# Patient Record
Sex: Male | Born: 1955
Health system: Southern US, Community
[De-identification: ages and names within clinical notes are randomized; demographics above are authoritative.]

## PROBLEM LIST (undated history)

## (undated) DIAGNOSIS — E785 Hyperlipidemia, unspecified: Secondary | ICD-10-CM

## (undated) DIAGNOSIS — I2510 Atherosclerotic heart disease of native coronary artery without angina pectoris: Secondary | ICD-10-CM

## (undated) DIAGNOSIS — I6529 Occlusion and stenosis of unspecified carotid artery: Secondary | ICD-10-CM

## (undated) DIAGNOSIS — I10 Essential (primary) hypertension: Secondary | ICD-10-CM

## (undated) HISTORY — PX: CAROTID ENDARTERECTOMY: SUR193

## (undated) HISTORY — DX: Hyperlipidemia, unspecified: E78.5

## (undated) HISTORY — DX: Occlusion and stenosis of unspecified carotid artery: I65.29

## (undated) HISTORY — PX: CARDIAC CATHETERIZATION: SHX172

---

## 1999-09-11 ENCOUNTER — Emergency Department (HOSPITAL_COMMUNITY): Admission: EM | Admit: 1999-09-11 | Discharge: 1999-09-11 | Payer: Self-pay

## 2000-10-12 ENCOUNTER — Other Ambulatory Visit: Admission: RE | Admit: 2000-10-12 | Discharge: 2000-10-12 | Payer: Self-pay | Admitting: Otolaryngology

## 2002-01-15 ENCOUNTER — Encounter: Payer: Self-pay | Admitting: Family Medicine

## 2002-01-15 ENCOUNTER — Ambulatory Visit (HOSPITAL_COMMUNITY): Admission: RE | Admit: 2002-01-15 | Discharge: 2002-01-15 | Payer: Self-pay | Admitting: Family Medicine

## 2002-01-17 ENCOUNTER — Ambulatory Visit (HOSPITAL_COMMUNITY): Admission: RE | Admit: 2002-01-17 | Discharge: 2002-01-17 | Payer: Self-pay | Admitting: Family Medicine

## 2002-01-17 ENCOUNTER — Encounter: Payer: Self-pay | Admitting: Family Medicine

## 2017-10-16 DIAGNOSIS — Z23 Encounter for immunization: Secondary | ICD-10-CM | POA: Diagnosis not present

## 2017-10-31 DIAGNOSIS — E119 Type 2 diabetes mellitus without complications: Secondary | ICD-10-CM | POA: Diagnosis not present

## 2017-10-31 DIAGNOSIS — J019 Acute sinusitis, unspecified: Secondary | ICD-10-CM | POA: Diagnosis not present

## 2017-10-31 DIAGNOSIS — I10 Essential (primary) hypertension: Secondary | ICD-10-CM | POA: Diagnosis not present

## 2017-10-31 DIAGNOSIS — E782 Mixed hyperlipidemia: Secondary | ICD-10-CM | POA: Diagnosis not present

## 2017-12-20 DIAGNOSIS — D1801 Hemangioma of skin and subcutaneous tissue: Secondary | ICD-10-CM | POA: Diagnosis not present

## 2017-12-20 DIAGNOSIS — D225 Melanocytic nevi of trunk: Secondary | ICD-10-CM | POA: Diagnosis not present

## 2017-12-20 DIAGNOSIS — L821 Other seborrheic keratosis: Secondary | ICD-10-CM | POA: Diagnosis not present

## 2018-03-26 DIAGNOSIS — R300 Dysuria: Secondary | ICD-10-CM | POA: Diagnosis not present

## 2018-03-26 DIAGNOSIS — E782 Mixed hyperlipidemia: Secondary | ICD-10-CM | POA: Diagnosis not present

## 2018-05-28 ENCOUNTER — Other Ambulatory Visit: Payer: Self-pay | Admitting: Family Medicine

## 2018-05-28 DIAGNOSIS — R1032 Left lower quadrant pain: Secondary | ICD-10-CM

## 2018-05-29 ENCOUNTER — Ambulatory Visit
Admission: RE | Admit: 2018-05-29 | Discharge: 2018-05-29 | Disposition: A | Discharge status: Home / Self Care | Payer: 59 | Source: Ambulatory Visit | Attending: Family Medicine | Admitting: Family Medicine

## 2018-05-29 DIAGNOSIS — R1032 Left lower quadrant pain: Secondary | ICD-10-CM

## 2018-05-29 MED ORDER — IOPAMIDOL (ISOVUE-300) INJECTION 61%
100.0000 mL | Freq: Once | INTRAVENOUS | Status: AC | PRN
  Administered 2018-05-29: 100 mL via INTRAVENOUS

## 2018-05-30 ENCOUNTER — Other Ambulatory Visit: Payer: Self-pay | Admitting: Family Medicine

## 2018-05-30 DIAGNOSIS — R1032 Left lower quadrant pain: Secondary | ICD-10-CM

## 2019-12-25 ENCOUNTER — Encounter: Payer: Self-pay | Admitting: Cardiovascular Disease

## 2020-01-02 ENCOUNTER — Encounter: Payer: Self-pay | Admitting: Cardiovascular Disease

## 2020-01-02 ENCOUNTER — Other Ambulatory Visit: Payer: Self-pay

## 2020-01-02 ENCOUNTER — Ambulatory Visit (INDEPENDENT_AMBULATORY_CARE_PROVIDER_SITE_OTHER): Payer: 59 | Admitting: Cardiovascular Disease

## 2020-01-02 ENCOUNTER — Encounter (INDEPENDENT_AMBULATORY_CARE_PROVIDER_SITE_OTHER): Payer: Self-pay

## 2020-01-02 DIAGNOSIS — E7849 Other hyperlipidemia: Secondary | ICD-10-CM | POA: Diagnosis not present

## 2020-01-02 DIAGNOSIS — I10 Essential (primary) hypertension: Secondary | ICD-10-CM | POA: Diagnosis not present

## 2020-01-02 DIAGNOSIS — R9431 Abnormal electrocardiogram [ECG] [EKG]: Secondary | ICD-10-CM | POA: Diagnosis not present

## 2020-01-02 DIAGNOSIS — M5412 Radiculopathy, cervical region: Secondary | ICD-10-CM

## 2020-01-02 DIAGNOSIS — R0789 Other chest pain: Secondary | ICD-10-CM | POA: Diagnosis not present

## 2020-01-02 DIAGNOSIS — R7303 Prediabetes: Secondary | ICD-10-CM

## 2020-01-02 DIAGNOSIS — R55 Syncope and collapse: Secondary | ICD-10-CM

## 2020-01-02 MED ORDER — METOPROLOL SUCCINATE ER 25 MG PO TB24: 25.0000 mg | ORAL_TABLET | Freq: Every day | ORAL | 3 refills | Status: DC

## 2020-01-02 MED ORDER — METOPROLOL TARTRATE 100 MG PO TABS: ORAL_TABLET | ORAL | 0 refills | Status: DC

## 2020-01-02 MED ORDER — NITROGLYCERIN 0.4 MG SL SUBL: 0.4000 mg | SUBLINGUAL_TABLET | SUBLINGUAL | 3 refills | Status: DC | PRN

## 2020-01-02 NOTE — Progress Notes (Signed)
Cardiology Office Note    Date:  01/04/2020   ID:  Jesus Francis, DOB [birth year], MRN [id number]  PCP: Dr. Marijo File Cardiologist:  Shelva Majestic, MD   New cardiology evaluation sent through the courtesy of Dr. Marijo File for evaluation of exertional chest discomfort.  History of Present Illness:  Jesus Francis is a 64 y.o. male who was referred for through the courtesy of Dr. Marijo File for exertional chest pressure.  Mr. Jesus Francis has a history of hypertension as well as hyperlipidemia.  In November 2018, LDL cholesterol was 264 and apparently was against statin therapy.  He states that over 10 years ago he did experience some chest discomfort and apparently had a stress test.  He was told that this was normal but never saw a cardiologist.  The patient has issues with bone spurs involving his cervical and thoracic spine.  He states that around Thanksgiving he became upset and apparently passed out for short duration.  He did note chest pressure.  Subsequently, he has noted some exertional chest pressure with activity.  Remotely he used to work out hard but admits that over the past 2 years he really has not exercised to any capacity.  He does admit to being under increased stress and that his wife died 31 years ago at age 76.  His youngest daughter also had brain cancer.  He was recently evaluated by Dr. Marijo File on December 25, 2019.  His ECG raise concern for prior heart attack.  There was no ECG to compare to laboratory showed a total cholesterol of 318, HDL 45, LDL 240, triglycerides 170.  Hemoglobin A1c was 6.1.  Creatinine 1.02.  LFTs were normal.  During his evaluation, his blood pressure was elevated at 172/102 and he was started on a prescription this oculovestibular exam.  He also was started on rosuvastatin 40 mg in light of his significant hyperlipidemia.  He is now referred for cardiology evaluation.   Past medical history is notable for hypertension, hyperlipidemia,  history of kidney stones, history of internal hemorrhoids, remote history of hematuria with negative work-up by urology, history of carpal tunnel syndrome status post bilateral release, previous documentation of aortoiliac atherosclerosis seen on CT imaging in 2019.  Past surgical history is notable for his carpal tunnel release, hemorrhoidal surgery, as well as vocal cord surgery.  Current Medications: Outpatient Medications Prior to Visit  Medication Sig Dispense Refill  . aspirin EC 81 MG tablet Take 81 mg by mouth daily.    . rosuvastatin (CRESTOR) 40 MG tablet Take 40 mg by mouth daily.    . valsartan (DIOVAN) 160 MG tablet Take 160 mg by mouth daily.     No facility-administered medications prior to visit.     Allergies:   Patient has no known allergies.   Social History   Socioeconomic History  . Marital status: Married    Spouse name: Not on file  . Number of children: Not on file  . Years of education: Not on file  . Highest education level: Not on file  Occupational History  . Not on file  Tobacco Use  . Smoking status: Never Smoker  . Smokeless tobacco: Never Used  Substance and Sexual Activity  . Alcohol use: Yes    Alcohol/week: 3.0 standard drinks    Types: 1 Glasses of wine, 1 Shots of liquor, 1 Cans of beer per week  . Drug use: Not on file  . Sexual activity: Not on file  Other  Topics Concern  . Not on file  Social History Narrative  . Not on file   Social Determinants of Health   Financial Resource Strain:   . Difficulty of Paying Living Expenses: Not on file  Food Insecurity:   . Worried About Charity fundraiser in the Last Year: Not on file  . Ran Out of Food in the Last Year: Not on file  Transportation Needs:   . Lack of Transportation (Medical): Not on file  . Lack of Transportation (Non-Medical): Not on file  Physical Activity:   . Days of Exercise per Week: Not on file  . Minutes of Exercise per Session: Not on file  Stress:   . Feeling  of Stress : Not on file  Social Connections:   . Frequency of Communication with Friends and Family: Not on file  . Frequency of Social Gatherings with Friends and Family: Not on file  . Attends Religious Services: Not on file  . Active Member of Clubs or Organizations: Not on file  . Attends Archivist Meetings: Not on file  . Marital Status: Not on file     Social history is notable in that he is widowed for 5 years when his wife died at age 34.  He has 2 children ages 87 and 83 and his youngest daughter had a previous brain tumor.  He graduated from [employer].  He works for [employer] in [employer].  He does not smoke cigarettes.  There is a rare cigar use.  He drinks an occasional beer wine scotch or bourbon.  Family History:  The patient's family history includes Heart attack in his brother, brother, and father; Heart disease in his brother, sister, and sister.   His mother died at age 43.  His father died at age 64 but had a heart attack at age 66.  There are 6 siblings.  Brother age 34 who is alive and well, brother age 31 has a pacemaker and defibrillator, a brother died at age 106 with a heart attack and another brother died at age 45 with a heart attack.  One sister age 16 had a stent at age 65 and another sister at age 22 had bypass surgery at age 30.  ROS General: Negative; No fevers, chills, or night sweats;  HEENT: Negative; No changes in vision or hearing, sinus congestion, difficulty swallowing Pulmonary: Negative; No cough, wheezing, shortness of breath, hemoptysis Cardiovascular: See HPI GI: Negative; No nausea, vomiting, diarrhea, or abdominal pain GU: Negative; No dysuria, hematuria, or difficulty voiding Musculoskeletal: Positive for left radicular pain Hematologic/Oncology: Negative; no easy bruising, bleeding Endocrine: Negative; no heat/cold intolerance; no diabetes Neuro: Negative; no changes in balance, headaches Skin: Negative; No rashes or skin  lesions Psychiatric: Negative; No behavioral problems, depression Sleep: Negative; No snoring, daytime sleepiness, hypersomnolence, bruxism, restless legs, hypnogognic hallucinations, no cataplexy Other comprehensive 14 point system review is negative.   PHYSICAL EXAM:   VS:  BP (!) 142/83   Pulse 81   Temp (!) 97.1 F (36.2 C)   Ht 5\' 9"  (1.753 m)   Wt 199 lb 12.8 oz (90.6 kg)   SpO2 97%   BMI 29.51 kg/m     Repeat blood pressure by me was 136/84  Wt Readings from Last 3 Encounters:  01/02/20 199 lb 12.8 oz (90.6 kg)    General: Alert, oriented, no distress.  Skin: normal turgor, no rashes, warm and dry HEENT: Normocephalic, atraumatic. Pupils equal round and reactive  to light; sclera anicteric; extraocular muscles intact;  Nose without nasal septal hypertrophy Mouth/Parynx benign;  Neck: No JVD, no carotid bruits; normal carotid upstroke Lungs: clear to ausculatation and percussion; no wheezing or rales Chest wall: without tenderness to palpitation Heart: PMI not displaced, RRR, s1 s2 normal, 1/6 systolic murmur, no diastolic murmur, no rubs, gallops, thrills, or heaves Abdomen: soft, nontender; no hepatosplenomehaly, BS+; abdominal aorta nontender and not dilated by palpation. Back: no CVA tenderness Pulses 2+ Musculoskeletal: full range of motion, normal strength, no joint deformities Extremities: no clubbing cyanosis or edema, Homan's sign negative  Neurologic: grossly nonfocal; Cranial nerves grossly wnl Psychologic: Normal mood and affect   Studies/Labs Reviewed:   EKG:  EKG is ordered today.  ECG (independently read by me): Normal sinus rhythm at 81 bpm.  Small inferior Q waves in leads III and aVF suggestive of  age-indeterminate inferior infarct.  Early transition.  Normal intervals.  No ectopy  Recent Labs: No flowsheet data found.   No flowsheet data found.  No flowsheet data found. No results found for: MCV No results found for: TSH No results found  for: HGBA1C   BNP No results found for: BNP  ProBNP No results found for: PROBNP   Lipid Panel  No results found for: CHOL, TRIG, HDL, CHOLHDL, VLDL, LDLCALC, LDLDIRECT, LABVLDL   RADIOLOGY: No results found.   Additional studies/ records that were reviewed today include:  I reviewed the records from Dr. Marijo File  Laboratory from December 25, 2019: Total cholesterol 318, HDL 45, LDL 240, triglycerides 170 HDL 6.1, consistent with prediabetes Creatinine 1.02.   ASSESSMENT:    1. Exertional chest tightness   2. Essential hypertension   3. Possible familial hyperlipidemia   4. Abnormal ECG   5. Prediabetes   6. Syncope, unspecified syncope type   7. Cervical radiculopathy     PLAN:  Mr. Orval Gatley is a 64 year old gentleman who has a history of hypertension, significant hyperlipidemia as well as family history for heart disease.  The patient has had markedly elevated LDL levels in the past suggesting a high likelihood for familial hyperlipidemia.  His most recent lipid panel in December 2020 has shown a total cholesterol of 318 with an LDL cholesterol of 240.  The patient recently had experienced an episode where he passed out being and subsequently has noticed some exertional chest pressure.  His ECG today shows sinus rhythm but there is evidence for inferior Q waves with early transition suggesting the possibility of an inferior posterior MI which may have occurred.  He also has recently been found to be significantly hypertensive with stage II hypertension for which he was started on valsartan at 160 mg daily.  I discussed with him new hypertensive guidelines with ideal blood pressure less than 120/80, stage I hypertension commencing at 130/80, and stage II hypertension at 140/90.  He has noticed exertional chest tightness symptomatology worrisome for anginal symptomatology.  He also has had issues with bone spurs involving his cervical and thoracic spine and has been  felt to have a left cervical radiculopathy.  With his exertional symptomatology, I am recommending initiation of metoprolol succinate at 25 mg which will also be helpful for blood pressure control.  I have also given him a prescription for sublingual nitroglycerin.  I am scheduling him for an echo Doppler study to assess LV systolic and diastolic function as well as wall motion particularly with his inferior Q waves.  I discussed further evaluation strategies regarding coronary  anatomy.  I will schedule him for an initial coronary CTA evaluation to evaluate coronary atherosclerosis and FFR will be performed if indicated.  I am concerned that he may very well have familial hyperlipidemia.  He was just started on rosuvastatin.  He will need to undergo repeat laboratory in several months and if still elevated Zetia will need to be added.  If LDL continues to be elevated I discussed PCSK9 inhibition and he may be a candidate for Repatha.  He will monitor his blood pressure at home.  His elevated hemoglobin A1c at 6.1 is consistent with prediabetes.  I will see him in the office setting in 4 weeks in follow-up of the above studies and further recommendations will be made at that time.   Medication Adjustments/Labs and Tests Ordered: Current medicines are reviewed at length with the patient today.  Concerns regarding medicines are outlined above.  Medication changes, Labs and Tests ordered today are listed in the Patient Instructions below. Patient Instructions  Medication Instructions:  START METOPROLOL SUCC ER 25 MG ONCE DAILY AT BEDTIME  NTG AS NEEDED  *If you need a refill on your cardiac medications before your next appointment, please call your pharmacy*  Lab Work: Your physician recommends that you return for lab work in: [place]  If you have labs (blood work) drawn today and your tests are completely normal, you will receive your results only by: Marland Kitchen MyChart Message (if you have  MyChart) OR . A paper copy in the mail If you have any lab test that is abnormal or we need to change your treatment, we will call you to review the results.  Testing/Procedures: Your physician has requested that you have an echocardiogram. Echocardiography is a painless test that uses sound waves to create images of your heart. It provides your doctor with information about the size and shape of your heart and how well your heart's chambers and valves are working. This procedure takes approximately one hour. There are no restrictions for this procedure.[place]    Follow-Up: At [facility], you and your health needs are our priority.  As part of our continuing mission to provide you with exceptional heart care, we have created designated Provider Care Teams.  These Care Teams include your primary Cardiologist (physician) and Advanced Practice Providers (APPs -  Physician Assistants and Nurse Practitioners) who all work together to provide you with the care you need, when you need it.  Your next appointment:   4 week(s)  The format for your next appointment:   In Person  Provider:   Shelva Majestic, MD  Other Instructions Your cardiac CT will be scheduled at one of the below locations:   [facility] [address] [place], [place] [address] [phone]  If scheduled at [facility], please arrive at the [facility] main entrance of [facility] 30-45 minutes prior to test start time. Proceed to the [facility] Radiology Department (first floor) to check-in and test prep.  Please follow these instructions carefully (unless otherwise directed):  Hold all erectile dysfunction medications at least 3 days (72 hrs) prior to test.  On the Night Before the Test: . Be sure to Drink plenty of water. . Do not consume any caffeinated/decaffeinated beverages or chocolate 12 hours prior to your test. . Do not take any antihistamines 12 hours prior  to your test.  On the Day of the Test: . Drink plenty of water. Do  not drink any water within one hour of the test. . Do not eat any food 4 hours prior to the test. . You may take your regular medications prior to the test.  . Take metoprolol (Lopressor) 100 MG two hours prior to test. . HOLD Furosemide/Hydrochlorothiazide morning of the test. . FEMALES- please wear underwire-free bra if available       After the Test: . Drink plenty of water. . After receiving IV contrast, you may experience a mild flushed feeling. This is normal. . On occasion, you may experience a mild rash up to 24 hours after the test. This is not dangerous. If this occurs, you can take Benadryl 25 mg and increase your fluid intake. . If you experience trouble breathing, this can be serious. If it is severe call 911 IMMEDIATELY. If it is mild, please call our office. . If you take any of these medications: Glipizide/Metformin, Avandament, Glucavance, please do not take 48 hours after completing test unless otherwise instructed.   Once we have confirmed authorization from your insurance company, we will call you to set up a date and time for your test.   For non-scheduling related questions, please contact the cardiac imaging nurse navigator should you have any questions/concerns: Marchia Bond, RN Navigator Cardiac Imaging [facility] [phone] Office        Signed, Shelva Majestic, MD  01/04/2020 12:22 PM    [facility] [address], [place], [place], [place]  [address] Phone: [phone]

## 2020-01-02 NOTE — Patient Instructions (Addendum)
Medication Instructions:  START METOPROLOL SUCC ER 25 MG ONCE DAILY AT BEDTIME  NTG AS NEEDED  *If you need a refill on your cardiac medications before your next appointment, please call your pharmacy*  Lab Work: Your physician recommends that you return for lab work in: [place]  If you have labs (blood work) drawn today and your tests are completely normal, you will receive your results only by: Marland Kitchen MyChart Message (if you have MyChart) OR . A paper copy in the mail If you have any lab test that is abnormal or we need to change your treatment, we will call you to review the results.  Testing/Procedures: Your physician has requested that you have an echocardiogram. Echocardiography is a painless test that uses sound waves to create images of your heart. It provides your doctor with information about the size and shape of your heart and how well your heart's chambers and valves are working. This procedure takes approximately one hour. There are no restrictions for this procedure.[place]    Follow-Up: At [facility], you and your health needs are our priority.  As part of our continuing mission to provide you with exceptional heart care, we have created designated Provider Care Teams.  These Care Teams include your primary Cardiologist (physician) and Advanced Practice Providers (APPs -  Physician Assistants and Nurse Practitioners) who all work together to provide you with the care you need, when you need it.  Your next appointment:   4 week(s)  The format for your next appointment:   In Person  Provider:   Shelva Majestic, MD  Other Instructions Your cardiac CT will be scheduled at one of the below locations:   [facility] [address] [place], [place] [address] [phone]  If scheduled at [facility], please arrive at the [facility] main entrance of [facility] 30-45 minutes prior to test start time. Proceed  to the [facility] Radiology Department (first floor) to check-in and test prep.  Please follow these instructions carefully (unless otherwise directed):  Hold all erectile dysfunction medications at least 3 days (72 hrs) prior to test.  On the Night Before the Test: . Be sure to Drink plenty of water. . Do not consume any caffeinated/decaffeinated beverages or chocolate 12 hours prior to your test. . Do not take any antihistamines 12 hours prior to your test.  On the Day of the Test: . Drink plenty of water. Do not drink any water within one hour of the test. . Do not eat any food 4 hours prior to the test. . You may take your regular medications prior to the test.  . Take metoprolol (Lopressor) 100 MG two hours prior to test. . HOLD Furosemide/Hydrochlorothiazide morning of the test. . FEMALES- please wear underwire-free bra if available       After the Test: . Drink plenty of water. . After receiving IV contrast, you may experience a mild flushed feeling. This is normal. . On occasion, you may experience a mild rash up to 24 hours after the test. This is not dangerous. If this occurs, you can take Benadryl 25 mg and increase your fluid intake. . If you experience trouble breathing, this can be serious. If it is severe call 911 IMMEDIATELY. If it is mild, please call our office. . If you take any of these medications: Glipizide/Metformin, Avandament, Glucavance, please do not take 48 hours after completing test unless otherwise instructed.  Once we have confirmed authorization from your insurance company, we will call you to set up a date and time for your test.   For non-scheduling related questions, please contact the cardiac imaging nurse navigator should you have any questions/concerns: Marchia Bond, RN Navigator Cardiac Imaging [facility] [phone] Office

## 2020-01-04 ENCOUNTER — Encounter: Payer: Self-pay | Admitting: Cardiovascular Disease

## 2020-01-08 ENCOUNTER — Ambulatory Visit (HOSPITAL_COMMUNITY): Payer: 59 | Attending: Cardiovascular Disease

## 2020-01-08 ENCOUNTER — Other Ambulatory Visit: Payer: Self-pay

## 2020-01-08 DIAGNOSIS — R0789 Other chest pain: Secondary | ICD-10-CM | POA: Diagnosis present

## 2020-01-16 ENCOUNTER — Telehealth: Payer: Self-pay | Admitting: Cardiovascular Disease

## 2020-01-16 NOTE — Telephone Encounter (Signed)
-----   Message -----  From: Howie Ill  Sent: 01/15/2020 10:49 AM EST  To: Cristopher Estimable, RN  Subject: Cardiac Ct- please call pt            Good morning Debra   I called this patient this morning to schedule the cardiac ct, he is a little upset that my first available appt in [place] is February 4,2021 and [facility] is February 16,2021 , he states that he has an appt with Dr Claiborne Billings on February 4,2021, I offered to add him to the waitlist incase we get a cancellation. He would like you to call him and advise him of what to do.   Thank you  Jannet Askew

## 2020-01-16 NOTE — Telephone Encounter (Addendum)
Patient is upset as his CT was approved per his insurance as of Jan 7. He reports that he called multiple times about the authorization but was told that the authorization was not on file. He is upset that he had to wait on getting his test scheduled. Apologized for this inconvenience and explained that there is limited availability to schedule this tests, based on scanners/reading MDs  He was reminded that his CT test is Feb 4 and he will need labs prior.   R/S patient for f/u with MD on Feb 10 - after testing completed  Question about beta-blocker: He was started on toprol by MD at last visit and also given 1 time dose of lopressor 100mg  to take 2 hours prior to CT test. He is asking if he needs to take both on day of test. Will clarify with MD and call back   Spoke with Dr. Claiborne Billings who reports patient may not need to take metoprolol succinate 25mg  QD AND lopressor 100mg  2 hours prior to CT, depending on his HR  BP/HR readings since starting metoprolol succinate - he does reports some fatigue since starting medication 115/70 HR 70 124/70 HR 65 124/69 HR 68 135/77 HR 72 108/63 HR 83  Per Dr. Claiborne Billings, patient should take toprol 25mg  as directed and only 50mg  of lopressor 2 hours prior to CT test Patient called and updated

## 2020-01-28 LAB — COMPREHENSIVE METABOLIC PANEL
ALT: 22 IU/L (ref 0–44)
AST: 21 IU/L (ref 0–40)
Albumin/Globulin Ratio: 1.5 (ref 1.2–2.2)
Albumin: 4.3 g/dL (ref 3.8–4.8)
Alkaline Phosphatase: 56 IU/L (ref 39–117)
BUN/Creatinine Ratio: 13 (ref 10–24)
BUN: 14 mg/dL (ref 8–27)
Bilirubin Total: 0.3 mg/dL (ref 0.0–1.2)
CO2: 23 mmol/L (ref 20–29)
Calcium: 9.9 mg/dL (ref 8.6–10.2)
Chloride: 103 mmol/L (ref 96–106)
Creatinine, Ser: 1.09 mg/dL (ref 0.76–1.27)
GFR calc Af Amer: 82 mL/min/{1.73_m2} (ref >59)
GFR calc non Af Amer: 71 mL/min/{1.73_m2} (ref >59)
Globulin, Total: 2.8 g/dL (ref 1.5–4.5)
Glucose: 107 mg/dL — ABNORMAL HIGH (ref 65–99)
Potassium: 5.2 mmol/L (ref 3.5–5.2)
Sodium: 141 mmol/L (ref 134–144)
Total Protein: 7.1 g/dL (ref 6.0–8.5)

## 2020-01-28 LAB — LIPID PANEL
Chol/HDL Ratio: 3.5 ratio (ref 0.0–5.0)
Cholesterol, Total: 164 mg/dL (ref 100–199)
HDL: 47 mg/dL (ref >39)
LDL Chol Calc (NIH): 97 mg/dL (ref 0–99)
Triglycerides: 113 mg/dL (ref 0–149)
VLDL Cholesterol Cal: 20 mg/dL (ref 5–40)

## 2020-01-29 ENCOUNTER — Encounter (HOSPITAL_COMMUNITY): Payer: Self-pay

## 2020-01-29 ENCOUNTER — Telehealth (HOSPITAL_COMMUNITY): Payer: Self-pay | Admitting: Emergency Medicine

## 2020-01-29 NOTE — Telephone Encounter (Signed)
Reaching out to patient to offer assistance regarding upcoming cardiac imaging study; pt verbalizes understanding of appt date/time, parking situation and where to check in, pre-test NPO status and medications ordered, and verified current allergies; name and call back number provided for further questions should they arise Edith Lord RN Navigator Cardiac Imaging [facility] [phone] office [phone] cell 

## 2020-01-30 ENCOUNTER — Ambulatory Visit: Payer: 59 | Admitting: Cardiovascular Disease

## 2020-01-30 ENCOUNTER — Other Ambulatory Visit: Payer: Self-pay

## 2020-01-30 ENCOUNTER — Ambulatory Visit
Admission: RE | Admit: 2020-01-30 | Discharge: 2020-01-30 | Disposition: A | Discharge status: Home / Self Care | Payer: 59 | Source: Ambulatory Visit | Attending: Cardiovascular Disease | Admitting: Cardiovascular Disease

## 2020-01-30 DIAGNOSIS — R0789 Other chest pain: Secondary | ICD-10-CM | POA: Insufficient documentation

## 2020-01-30 DIAGNOSIS — I2510 Atherosclerotic heart disease of native coronary artery without angina pectoris: Secondary | ICD-10-CM | POA: Diagnosis not present

## 2020-01-30 MED ORDER — METOPROLOL TARTRATE 5 MG/5ML IV SOLN: 5.0000 mg | INTRAVENOUS | Status: DC | PRN

## 2020-01-30 MED ORDER — IOHEXOL 350 MG/ML SOLN
100.0000 mL | Freq: Once | INTRAVENOUS | Status: AC | PRN
  Administered 2020-01-30: 100 mL via INTRAVENOUS

## 2020-01-30 MED ORDER — NITROGLYCERIN 0.4 MG SL SUBL
0.8000 mg | SUBLINGUAL_TABLET | Freq: Once | SUBLINGUAL | Status: AC
  Administered 2020-01-30: 0.8 mg via SUBLINGUAL

## 2020-01-30 NOTE — Progress Notes (Addendum)
Patient tolerated CT well. Developed a headache 2/10 achy dull. Drank water a coke and ate crackers with peanut butter after. Headache improved to 0/10. Ambulatory steady gait to exit.

## 2020-01-31 ENCOUNTER — Telehealth: Payer: Self-pay | Admitting: Cardiovascular Disease

## 2020-01-31 DIAGNOSIS — I2510 Atherosclerotic heart disease of native coronary artery without angina pectoris: Secondary | ICD-10-CM | POA: Diagnosis not present

## 2020-01-31 NOTE — Telephone Encounter (Signed)
New message:    Patient calling corning some results concering his ECHO. Please call patient back.

## 2020-01-31 NOTE — Telephone Encounter (Signed)
Arrange for office visit next week and at that time we will discuss need for definitive cardiac catheterization

## 2020-01-31 NOTE — Telephone Encounter (Signed)
Patient had a question on his CT results. They were sent to his mychart- and at the bottom it says that a cath is recommended. I did advised patient that Dr.Kelly had no reviewed it yet, to give his recommendations but I would send a message to him and his nurse to make them aware, he just wants to be scheduled for something sooner rather than later.   Thank you!

## 2020-02-05 ENCOUNTER — Encounter: Payer: Self-pay | Admitting: Cardiovascular Disease

## 2020-02-05 ENCOUNTER — Ambulatory Visit (INDEPENDENT_AMBULATORY_CARE_PROVIDER_SITE_OTHER): Payer: 59 | Admitting: Cardiovascular Disease

## 2020-02-05 ENCOUNTER — Other Ambulatory Visit: Payer: Self-pay

## 2020-02-05 VITALS — BP 142/87 | HR 76 | Temp 98.4°F | Ht 69.0 in | Wt 212.6 lb

## 2020-02-05 DIAGNOSIS — R9431 Abnormal electrocardiogram [ECG] [EKG]: Secondary | ICD-10-CM

## 2020-02-05 DIAGNOSIS — I10 Essential (primary) hypertension: Secondary | ICD-10-CM | POA: Diagnosis not present

## 2020-02-05 DIAGNOSIS — E7849 Other hyperlipidemia: Secondary | ICD-10-CM

## 2020-02-05 DIAGNOSIS — R931 Abnormal findings on diagnostic imaging of heart and coronary circulation: Secondary | ICD-10-CM | POA: Diagnosis not present

## 2020-02-05 DIAGNOSIS — I209 Angina pectoris, unspecified: Secondary | ICD-10-CM

## 2020-02-05 MED ORDER — ISOSORBIDE MONONITRATE ER 30 MG PO TB24: 30.0000 mg | ORAL_TABLET | Freq: Every day | ORAL | 2 refills | Status: DC

## 2020-02-05 MED ORDER — METOPROLOL SUCCINATE ER 25 MG PO TB24: 25.0000 mg | ORAL_TABLET | Freq: Two times a day (BID) | ORAL | 3 refills | Status: DC

## 2020-02-05 MED ORDER — EZETIMIBE 10 MG PO TABS: 10.0000 mg | ORAL_TABLET | Freq: Every day | ORAL | 3 refills | Status: DC

## 2020-02-05 NOTE — Patient Instructions (Signed)
Medication Instructions:  BEGIN ZETIA 10MG  DAILY (1 TABLET DAILY) INCREASE METOPROLOL TO 50MG  DAILY ( 1 TABLET TWICE A DAY FOR A TOTAL OF 50MG ) BEGIN TAKING ISOSORBIDE 30MG  DAILY IN THE MORNING (1 TABLET IN THE MORNING) *If you need a refill on your cardiac medications before your next appointment, please call your pharmacy*  Lab Work: CBC, BMET If you have labs (blood work) drawn today and your tests are completely normal, you will receive your results only by: Marland Kitchen MyChart Message (if you have MyChart) OR . A paper copy in the mail If you have any lab test that is abnormal or we need to change your treatment, we will call you to review the results.  Testing/Procedures: CATH SCHEDULED FOR Tuesday THE 16TH AT 9 AM  Follow-Up:  FOLLOW UP POST CATH At [facility], you and your health needs are our priority.  As part of our continuing mission to provide you with exceptional heart care, we have created designated Provider Care Teams.  These Care Teams include your primary Cardiologist (physician) and Advanced Practice Providers (APPs -  Physician Assistants and Nurse Practitioners) who all work together to provide you with the care you need, when you need it.           [place] [address] Dept: [phone] Loc: [place]  02/05/2020  You are scheduled for a Cardiac Catheterization on Tuesday, February 16 with Dr. Shelva Majestic.  1. Please arrive at the [facility] (Main Entrance A) at [facility]: [address] [place], [place] [address] at 7:00 AM (This time is two hours before your procedure to ensure your preparation). Free valet parking service is available.   Special note: Every effort is made to have your procedure done on time. Please understand that emergencies sometimes delay scheduled procedures.  2. Diet: Do not eat solid  foods after midnight.  The patient may have clear liquids until 5am upon the day of the procedure.  3. Labs:  BMET and CBC by the end of this week  On the morning of your procedure, take your Aspirin and any morning medicines NOT listed above.  You may use sips of water.  5. Plan for one night stay--bring personal belongings. 6. Bring a current list of your medications and current insurance cards. 7. You MUST have a responsible person to drive you home. 8. Someone MUST be with you the first 24 hours after you arrive home or your discharge will be delayed. 9. Please wear clothes that are easy to get on and off and wear slip-on shoes.  Thank you for allowing [place] to care for you!   -- [facility]

## 2020-02-05 NOTE — H&P (View-Only) (Signed)
Cardiology Office Note    Date:  02/10/2020   ID:  Jesus Francis, DOB 01/17/1956, MRN [id number]  PCP: Dr. Marijo File Cardiologist:  Shelva Majestic, MD   F/U cardiology evaluation sent through the courtesy of Dr. Marijo File for evaluation of exertional chest discomfort.  History of Present Illness:  Jesus Francis is a 64 y.o. male who was referred for through the courtesy of Dr. Marijo File for exertional chest pressure.  Jesus Francis has a history of hypertension as well as hyperlipidemia.  In November 2018, LDL cholesterol was 264 and apparently was against statin therapy.  He states that over 10 years ago he did experience some chest discomfort and apparently had a stress test.  He was told that this was normal but never saw a cardiologist.  The Jesus Francis has issues with bone spurs involving his cervical and thoracic spine.  He states that around Thanksgiving he became upset and apparently passed out for short duration.  He did note chest pressure.  Subsequently, he has noted some exertional chest pressure with activity.  Remotely he used to work out hard but admits that over the past 2 years he really has not exercised to any capacity.  He does admit to being under increased stress and that his wife died 54 years ago at age 70.  His youngest daughter also had brain cancer.  He was recently evaluated by Dr. Marijo File on December 25, 2019.  His ECG raise concern for prior heart attack.  There was no ECG to compare to laboratory showed a total cholesterol of 318, HDL 45, LDL 240, triglycerides 170.  Hemoglobin A1c was 6.1.  Creatinine 1.02.  LFTs were normal.  During his evaluation, his blood pressure was elevated at 172/102 and he was started on valsartan 160 mg he also was started on rosuvastatin 40 mg in light of his significant hyperlipidemia.    When I saw him for initial evaluation on January 02, 2020, with his exertional change symptomatology I recommended initiation of metoprolol  succinate 25 mg which would also assist in his blood pressure control.  I recommended an echo Doppler study as well as coronary CTA evaluation with possible FFR.  If LDL continues to be elevated on maximal therapy I discussed potential future PCSK9 inhibition.  His echo Doppler study on January 08, 2020 demonstrated normal systolic function with EF 60 to 65%.  There was mildly increased left ventricular posterior wall thickness without LVH.  Coronary CTA was significant abnormal with a coronary calcium score 2195, 98th percentile for age and sex matched control.  He was found to have severe calcified plaque in the proximal RCA and proximal to mid LAD.  Subsequent FFR analysis suggested hemodynamic significance with mid LAD FFR less than 0.5 and circumflex FFR 0.77.  The proximal RCA FFR was 0.74.  He was felt after occlusion of the mid RCA.  Jesus Francis has felt improved with the addition of Toprol added to his valsartan.  He was worked into my schedule today to discuss his CTA findings.  He denies PND orthopnea or palpitations and has not had recent chest pain.   Past medical history is notable for hypertension, hyperlipidemia, history of kidney stones, history of internal hemorrhoids, remote history of hematuria with negative work-up by urology, history of carpal tunnel syndrome status post bilateral release, previous documentation of aortoiliac atherosclerosis seen on CT imaging in 2019.  Past surgical history is notable for his carpal tunnel release, hemorrhoidal surgery, as  well as vocal cord surgery.  Current Medications: Outpatient Medications Prior to Visit  Medication Sig Dispense Refill  . aspirin EC 81 MG tablet Take 81 mg by mouth at bedtime.     . nitroGLYCERIN (NITROSTAT) 0.4 MG SL tablet Place 1 tablet (0.4 mg total) under the tongue every 5 (five) minutes as needed for chest pain. (Jesus Francis taking differently: Place 0.4 mg under the tongue every 5 (five) minutes x 3 doses as needed  for chest pain. ) 90 tablet 3  . rosuvastatin (CRESTOR) 40 MG tablet Take 40 mg by mouth every evening.     . valsartan (DIOVAN) 160 MG tablet Take 160 mg by mouth every evening.     . metoprolol succinate (TOPROL XL) 25 MG 24 hr tablet Take 1 tablet (25 mg total) by mouth daily. 90 tablet 3  . metoprolol tartrate (LOPRESSOR) 100 MG tablet Take 50 mg by mouth once. Take 2 hours prior to CT test     No facility-administered medications prior to visit.     Allergies:   Jesus Francis has no known allergies.   Social History   Socioeconomic History  . Marital status: Married    Spouse name: Not on file  . Number of children: Not on file  . Years of education: Not on file  . Highest education level: Not on file  Occupational History  . Not on file  Tobacco Use  . Smoking status: Never Smoker  . Smokeless tobacco: Never Used  Substance and Sexual Activity  . Alcohol use: Yes    Alcohol/week: 3.0 standard drinks    Types: 1 Glasses of wine, 1 Shots of liquor, 1 Cans of beer per week  . Drug use: Not on file  . Sexual activity: Not on file  Other Topics Concern  . Not on file  Social History Narrative  . Not on file   Social Determinants of Health   Financial Resource Strain:   . Difficulty of Paying Living Expenses: Not on file  Food Insecurity:   . Worried About Charity fundraiser in the Last Year: Not on file  . Ran Out of Food in the Last Year: Not on file  Transportation Needs:   . Lack of Transportation (Medical): Not on file  . Lack of Transportation (Non-Medical): Not on file  Physical Activity:   . Days of Exercise per Week: Not on file  . Minutes of Exercise per Session: Not on file  Stress:   . Feeling of Stress : Not on file  Social Connections:   . Frequency of Communication with Friends and Family: Not on file  . Frequency of Social Gatherings with Friends and Family: Not on file  . Attends Religious Services: Not on file  . Active Member of Clubs or  Organizations: Not on file  . Attends Archivist Meetings: Not on file  . Marital Status: Not on file     Social history is notable in that he is widowed for 5 years when his wife died at age 56.  He has 2 children ages 69 and 56 and his youngest daughter had a previous brain tumor.  He graduated from [employer].  He works for [employer] in [employer].  He does not smoke cigarettes.  There is a rare cigar use.  He drinks an occasional beer wine scotch or bourbon.  Family History:  The Jesus Francis's family history includes Heart attack in his brother, brother, and father; Heart disease in his  brother, sister, and sister.   His mother died at age 11.  His father died at age 62 but had a heart attack at age 41.  There are 6 siblings.  Brother age 29 who is alive and well, brother age 9 has a pacemaker and defibrillator, a brother died at age 1 with a heart attack and another brother died at age 77 with a heart attack.  One sister age 5 had a stent at age 11 and another sister at age 21 had bypass surgery at age 69.  ROS General: Negative; No fevers, chills, or night sweats;  HEENT: Negative; No changes in vision or hearing, sinus congestion, difficulty swallowing Pulmonary: Negative; No cough, wheezing, shortness of breath, hemoptysis Cardiovascular: See HPI GI: Negative; No nausea, vomiting, diarrhea, or abdominal pain GU: Negative; No dysuria, hematuria, or difficulty voiding Musculoskeletal: Positive for left radicular pain Hematologic/Oncology: Negative; no easy bruising, bleeding Endocrine: Negative; no heat/cold intolerance; no diabetes Neuro: Negative; no changes in balance, headaches Skin: Negative; No rashes or skin lesions Psychiatric: Negative; No behavioral problems, depression Sleep: Negative; No snoring, daytime sleepiness, hypersomnolence, bruxism, restless legs, hypnogognic hallucinations, no cataplexy Other comprehensive 14 point system review is negative.   PHYSICAL  EXAM:   VS:  BP (!) 142/87   Pulse 76   Temp 98.4 F (36.9 C)   Ht '5\' 9"'$  (1.753 m)   Wt 212 lb 9.6 oz (96.4 kg)   SpO2 100%   BMI 31.40 kg/m     Repeat blood pressure by me was 135/85  Wt Readings from Last 3 Encounters:  02/05/20 212 lb 9.6 oz (96.4 kg)  01/02/20 199 lb 12.8 oz (90.6 kg)    General: Alert, oriented, no distress.  Skin: normal turgor, no rashes, warm and dry HEENT: Normocephalic, atraumatic. Pupils equal round and reactive to light; sclera anicteric; extraocular muscles intact;  Nose without nasal septal hypertrophy lipidemia and possible nonunion Mouth/Parynx benign; Mallinpatti scale 3 Neck: No JVD, no carotid bruits; normal carotid upstroke Lungs: clear to ausculatation and percussion; no wheezing or rales Chest wall: without tenderness to palpitation Heart: PMI not displaced, RRR, s1 s2 normal, 1/6 systolic murmur, no diastolic murmur, no rubs, gallops, thrills, or heaves Abdomen: soft, nontender; no hepatosplenomehaly, BS+; abdominal aorta nontender and not dilated by palpation. Back: no CVA tenderness Pulses 2+ Musculoskeletal: full range of motion, normal strength, no joint deformities Extremities: no clubbing cyanosis or edema, Homan's sign negative  Neurologic: grossly nonfocal; Cranial nerves grossly wnl Psychologic: Normal mood and affect   Studies/Labs Reviewed:   EKG:  EKG is ordered today.  ECG (independently read by me): Normal sinus rhythm at 85 bpm with mild sinus arrhythmia, small inferior Q waves with possible inferior infarct.  QTc interval 428 ms..  Number 150 ms  January 02, 2020 ECG (independently read by me): Normal sinus rhythm at 81 bpm.  Small inferior Q waves in leads III and aVF suggestive of  age-indeterminate inferior infarct.  Early transition.  Normal intervals.  No ectopy  Recent Labs: BMP Latest Ref Rng & Units 02/05/2020 01/28/2020  Glucose 65 - 99 mg/dL 160(H) 107(H)  BUN 8 - 27 mg/dL 15 14  Creatinine 0.76 - 1.27 mg/dL  1.02 1.09  BUN/Creat Ratio 10 - '24 15 13  '$ Sodium 134 - 144 mmol/L 142 141  Potassium 3.5 - 5.2 mmol/L 5.0 5.2  Chloride 96 - 106 mmol/L 101 103  CO2 20 - 29 mmol/L 22 23  Calcium 8.6 - 10.2 mg/dL 9.9  9.9     Hepatic Function Latest Ref Rng & Units 01/28/2020  Total Protein 6.0 - 8.5 g/dL 7.1  Albumin 3.8 - 4.8 g/dL 4.3  AST 0 - 40 IU/L 21  ALT 0 - 44 IU/L 22  Alk Phosphatase 39 - 117 IU/L 56  Total Bilirubin 0.0 - 1.2 mg/dL 0.3    CBC Latest Ref Rng & Units 02/05/2020  WBC 3.4 - 10.8 x10E3/uL 11.2(H)  Hemoglobin 13.0 - 17.7 g/dL 15.9  Hematocrit 37.5 - 51.0 % 46.9  Platelets 150 - 450 x10E3/uL 247   Lab Results  Component Value Date   MCV 91 02/05/2020   No results found for: TSH No results found for: HGBA1C   BNP No results found for: BNP  ProBNP No results found for: PROBNP   Lipid Panel     Component Value Date/Time   CHOL 164 01/28/2020 1043   TRIG 113 01/28/2020 1043   HDL 47 01/28/2020 1043   CHOLHDL 3.5 01/28/2020 1043   LDLCALC 97 01/28/2020 1043   LABVLDL 20 01/28/2020 1043     RADIOLOGY: CT CORONARY MORPH W/CTA COR W/SCORE W/CA W/CM &/OR WO/CM  Addendum Date: 01/31/2020   ADDENDUM REPORT: 01/31/2020 08:40 EXAM: OVER-READ INTERPRETATION  CT CHEST The following report is an over-read performed by radiologist Dr. Barnetta Hammersmith [facility] on 01/31/2020. This over-read does not include interpretation of cardiac or coronary anatomy or pathology. The coronary CTA interpretation by the cardiologist is attached. COMPARISON:  None. FINDINGS: No incidental vascular findings. Visualized mediastinum and hilar regions demonstrate no lymphadenopathy or masses. Visualized lungs demonstrate chronic disease with reticulonodular interstitial prominence bilaterally and scattered areas of scarring. No airspace consolidation, edema, mass, pneumothorax or pleural fluid identified in the visualized lungs. Visualized upper abdomen is unremarkable. Visualized bony  structures are unremarkable. IMPRESSION: Evidence of chronic interstitial lung disease. Electronically Signed   By: Aletta Edouard M.D.   On: 01/31/2020 08:40   Result Date: 01/31/2020 CLINICAL DATA:  Hx of chestpain EXAM: Cardiac/Coronary  CTA TECHNIQUE: The Jesus Francis was scanned on a Siemens Somatoform go.Top scanner. FINDINGS: A retrospective scan was triggered in the descending thoracic aorta. Axial non-contrast 3 mm slices were carried out through the heart. The data set was analyzed on a dedicated work station and scored using the [place]. Gantry rotation speed was 330 msecs and collimation was .6 mm. '50mg'$  of metoprolol and 0.8 mg of sl NTG was given. The 3D data set was reconstructed in 5% intervals of the 50-95 % of the R-R cycle. Diastolic phases were analyzed on a dedicated work station using MPR, MIP and VRT modes. The Jesus Francis received 100 cc of contrast. Aorta: Normal size. Ascending and descending aorta calcifications noted. No dissection. Aortic Valve:  Trileaflet.  mild calcifications. Coronary Arteries:  Normal coronary origin.  Right dominance. RCA is a large dominant artery that gives rise to PDA and PLA. There is severe calcified plaque in the proximal segment causing severe disease (>70%) Left main is a large artery that gives rise to LAD and LCX arteries. LAD is a large vessel that is diffusely diseased with severe calcified and non calcified plaque from the proximal to mid segment causing severe stenosis (>70%). LCX is a non-dominant artery that gives rise to one OM1 branch. There is no plaque. Other findings: Normal pulmonary vein drainage into the left atrium. Normal left atrial appendage without a thrombus. Normal size of the pulmonary artery. IMPRESSION: 1. Coronary calcium score of 2195. This was 98th percentile  for age and sex matched control. 2. Normal coronary origin with right dominance. 3. Severe calcified plaque in the proximal RCA and proximal to mid LAD causing severe  stenosis. 4. CAD-RADS 4 Severe stenosis. (70-99% or > 50% left main). Cardiac catheterization or CT FFR is recommended. Consider symptom-guided anti-ischemic pharmacotherapy as well as risk factor modification per guideline directed care. Additional analysis with CT FFR will be submitted and reported separately. Electronically Signed: By: Kate Sable M.D. On: 01/30/2020 16:51   CT CORONARY FRACTIONAL FLOW RESERVE DATA PREP  Result Date: 01/31/2020 EXAM: CT FFR ANALYSIS CLINICAL DATA:  Hx of chestpain FINDINGS: FFRct analysis was performed on the original cardiac CT angiogram dataset. Diagrammatic representation of the FFRct analysis is provided in a separate PDF document in PACS. This dictation was created using the PDF document and an interactive 3D model of the results. 3D model is not available in the EMR/PACS. Normal FFR range is >0.80. 1. Left Main:  No significant stenosis. 2. LAD: Severe stenosis in the proximal to mid LAD.  FFR <0.5 3. LCX: Significant stenosis in LCx, FFR 0.77. 4. RCA: Severe stenosis in the proximal RCA FFR of 0.74, total occ in the mid RCA. IMPRESSION: 1. CT FFR showed significant stenosis involving the LAD, RCA and LCx. Electronically Signed   By: Kate Sable M.D.   On: 01/31/2020 13:26     Additional studies/ records that were reviewed today include:  I reviewed the records from Dr. Marijo File  Laboratory from December 25, 2019: Total cholesterol 318, HDL 45, LDL 240, triglycerides 170 HDL 6.1, consistent with prediabetes Creatinine 1.02.  Echo Doppler study, coronary CTA and FFR analysis were reviewed and thoroughly discussed with the Jesus Francis.   ASSESSMENT:    1. Angina pectoris ([place])   2. Abnormal cardiac CT angiography   3. Essential hypertension   4. Possible familial hyperlipidemia   5. Abnormal ECG     PLAN:  Jesus Francis is a 64 year old gentleman who has a history of hypertension, significant hyperlipidemia as well as family  history for heart disease.  The Jesus Francis has had markedly elevated LDL levels in the past suggesting a high likelihood for familial hyperlipidemia.  His most recent lipid panel in December 2020 has shown a total cholesterol of 318 with an LDL cholesterol of 240.  The Jesus Francis  had experienced an episode where he passed out being and subsequently has noticed some exertional chest pressure.  When I saw him for initial evaluation his ECG showed evidence for inferior Q waves with early transition suggesting the possibility that he may have had an inferior posterior MI in the past.  He had recently been found to have stage II hypertension and was started on valsartan 160 mg daily.  At his initial evaluation with me I reviewed new hypertensive guidelines and with his chest discomfort and initially low-dose metoprolol succinate at 25 mg daily.  He has felt moved sensitive therapy.  I reviewed his echo Doppler study with LAD today which shows normal systolic function without wall motion abnormality.  There is increased left ventricular septal wall thickness and there were no regional wall motion abnormalities.  His coronary CTA is significantly abnormal with a calcium score at 2195 he was found to have significant plaque in the LAD and RCA.  FFR analysis suggests hemodynamic significance in his LAD RCA and left circumflex and suggest possible total mid RCA occlusion.  He chest CT over read suggests interstitial lung disease with reticular nodular interstitial  prominence bilaterally and scattered areas of scarring.  There is no airspace consolidation.  I had a long discussion with him in the office today.  I have recommended definitive diagnostic cardiac catheterization to delineate his coronary anatomy.  I discussed most of the time we can do this successfully through the radial approach but in alternative is the femoral approach if needed.The risks and benefits of a cardiac catheterization including, but not limited to,  death, stroke, MI, kidney damage and bleeding were discussed with the Jesus Francis who indicates understanding and agrees to proceed.  I have recommended the addition of Zetia 10 mg to his rosuvastatin 40 mg and attempt to be aggressive with lipid management.  I have recommended further titration of Toprol-XL to 50 mg as well as initiating isosorbide mononitrate 30 mg. Previous laboratories also suggested prediabetes with a hemoglobin A1c of 6.1. Catheterization will be scheduled for February 11, 2020 with possible intervention if indicated.     Medication Adjustments/Labs and Tests Ordered: Current medicines are reviewed at length with the Jesus Francis today.  Concerns regarding medicines are outlined above.  Medication changes, Labs and Tests ordered today are listed in the Jesus Francis Instructions below. Jesus Francis Instructions  Medication Instructions:  BEGIN ZETIA '10MG'$  DAILY (1 TABLET DAILY) INCREASE METOPROLOL TO '50MG'$  DAILY ( 1 TABLET TWICE A DAY FOR A TOTAL OF '50MG'$ ) BEGIN TAKING ISOSORBIDE '30MG'$  DAILY IN THE MORNING (1 TABLET IN THE MORNING) *If you need a refill on your cardiac medications before your next appointment, please call your pharmacy*  Lab Work: CBC, BMET If you have labs (blood work) drawn today and your tests are completely normal, you will receive your results only by: Marland Kitchen MyChart Message (if you have MyChart) OR . A paper copy in the mail If you have any lab test that is abnormal or we need to change your treatment, we will call you to review the results.  Testing/Procedures: CATH SCHEDULED FOR Tuesday THE 16TH AT 9 AM  Follow-Up:  FOLLOW UP POST CATH At [facility], you and your health needs are our priority.  As part of our continuing mission to provide you with exceptional heart care, we have created designated Provider Care Teams.  These Care Teams include your primary Cardiologist (physician) and Advanced Practice Providers (APPs -  Physician Assistants and Nurse Practitioners)  who all work together to provide you with the care you need, when you need it.           [place] [address] Dept: [phone] Loc: [place]  02/05/2020  You are scheduled for a Cardiac Catheterization on Tuesday, February 16 with Dr. Shelva Majestic.  1. Please arrive at the [facility] (Main Entrance A) at [facility]: [address] [place], [place] [address] at 7:00 AM (This time is two hours before your procedure to ensure your preparation). Free valet parking service is available.   Special note: Every effort is made to have your procedure done on time. Please understand that emergencies sometimes delay scheduled procedures.  2. Diet: Do not eat solid foods after midnight.  The Jesus Francis may have clear liquids until 5am upon the day of the procedure.  3. Labs:  BMET and CBC by the end of this week  On the morning of your procedure, take your Aspirin and any morning medicines NOT listed above.  You may use sips of water.  5. Plan for  one night stay--bring personal belongings. 6. Bring a current list of your medications and current insurance cards. 7. You MUST have a responsible person to drive you home. 8. Someone MUST be with you the first 24 hours after you arrive home or your discharge will be delayed. 9. Please wear clothes that are easy to get on and off and wear slip-on shoes.  Thank you for allowing [place] to care for you!   -- [facility]     Signed, Shelva Majestic, MD  02/10/2020 [place] [address], [place], [place],   [address] Phone: [phone]  He had discontinued this nature recommended with the spironolactone and that I would get that maybe about a week or 2 his digestive follow-up make sure the potassium does not allow

## 2020-02-05 NOTE — Progress Notes (Signed)
Cardiology Office Note    Date:  02/10/2020   ID:  Jesus Francis, DOB 1956/10/08, MRN [id number]  PCP: Dr. Marijo File Cardiologist:  Shelva Majestic, MD   F/U cardiology evaluation sent through the courtesy of Dr. Marijo File for evaluation of exertional chest discomfort.  History of Present Illness:  Jesus Francis is a 64 y.o. male who was referred for through the courtesy of Dr. Marijo File for exertional chest pressure.  Jesus Francis has a history of hypertension as well as hyperlipidemia.  In November 2018, LDL cholesterol was 264 and apparently was against statin therapy.  He states that over 10 years ago he did experience some chest discomfort and apparently had a stress test.  He was told that this was normal but never saw a cardiologist.  The patient has issues with bone spurs involving his cervical and thoracic spine.  He states that around Thanksgiving he became upset and apparently passed out for short duration.  He did note chest pressure.  Subsequently, he has noted some exertional chest pressure with activity.  Remotely he used to work out hard but admits that over the past 2 years he really has not exercised to any capacity.  He does admit to being under increased stress and that his wife died 58 years ago at age 76.  His youngest daughter also had brain cancer.  He was recently evaluated by Dr. Marijo File on December 25, 2019.  His ECG raise concern for prior heart attack.  There was no ECG to compare to laboratory showed a total cholesterol of 318, HDL 45, LDL 240, triglycerides 170.  Hemoglobin A1c was 6.1.  Creatinine 1.02.  LFTs were normal.  During his evaluation, his blood pressure was elevated at 172/102 and he was started on valsartan 160 mg he also was started on rosuvastatin 40 mg in light of his significant hyperlipidemia.    When I saw him for initial evaluation on January 02, 2020, with his exertional change symptomatology I recommended initiation of metoprolol  succinate 25 mg which would also assist in his blood pressure control.  I recommended an echo Doppler study as well as coronary CTA evaluation with possible FFR.  If LDL continues to be elevated on maximal therapy I discussed potential future PCSK9 inhibition.  His echo Doppler study on January 08, 2020 demonstrated normal systolic function with EF 60 to 65%.  There was mildly increased left ventricular posterior wall thickness without LVH.  Coronary CTA was significant abnormal with a coronary calcium score 2195, 98th percentile for age and sex matched control.  He was found to have severe calcified plaque in the proximal RCA and proximal to mid LAD.  Subsequent FFR analysis suggested hemodynamic significance with mid LAD FFR less than 0.5 and circumflex FFR 0.77.  The proximal RCA FFR was 0.74.  He was felt after occlusion of the mid RCA.  Jesus Francis has felt improved with the addition of Toprol added to his valsartan.  He was worked into my schedule today to discuss his CTA findings.  He denies PND orthopnea or palpitations and has not had recent chest pain.   Past medical history is notable for hypertension, hyperlipidemia, history of kidney stones, history of internal hemorrhoids, remote history of hematuria with negative work-up by urology, history of carpal tunnel syndrome status post bilateral release, previous documentation of aortoiliac atherosclerosis seen on CT imaging in 2019.  Past surgical history is notable for his carpal tunnel release, hemorrhoidal surgery, as  well as vocal cord surgery.  Current Medications: Outpatient Medications Prior to Visit  Medication Sig Dispense Refill   aspirin EC 81 MG tablet Take 81 mg by mouth at bedtime.      nitroGLYCERIN (NITROSTAT) 0.4 MG SL tablet Place 1 tablet (0.4 mg total) under the tongue every 5 (five) minutes as needed for chest pain. (Patient taking differently: Place 0.4 mg under the tongue every 5 (five) minutes x 3 doses as needed  for chest pain. ) 90 tablet 3   rosuvastatin (CRESTOR) 40 MG tablet Take 40 mg by mouth every evening.      valsartan (DIOVAN) 160 MG tablet Take 160 mg by mouth every evening.      metoprolol succinate (TOPROL XL) 25 MG 24 hr tablet Take 1 tablet (25 mg total) by mouth daily. 90 tablet 3   metoprolol tartrate (LOPRESSOR) 100 MG tablet Take 50 mg by mouth once. Take 2 hours prior to CT test     No facility-administered medications prior to visit.     Allergies:   Patient has no known allergies.   Social History   Socioeconomic History   Marital status: Married    Spouse name: Not on file   Number of children: Not on file   Years of education: Not on file   Highest education level: Not on file  Occupational History   Not on file  Tobacco Use   Smoking status: Never Smoker   Smokeless tobacco: Never Used  Substance and Sexual Activity   Alcohol use: Yes    Alcohol/week: 3.0 standard drinks    Types: 1 Glasses of wine, 1 Shots of liquor, 1 Cans of beer per week   Drug use: Not on file   Sexual activity: Not on file  Other Topics Concern   Not on file  Social History Narrative   Not on file   Social Determinants of Health   Financial Resource Strain:    Difficulty of Paying Living Expenses: Not on file  Food Insecurity:    Worried About Running Out of Food in the Last Year: Not on file   Ran Out of Food in the Last Year: Not on file  Transportation Needs:    Lack of Transportation (Medical): Not on file   Lack of Transportation (Non-Medical): Not on file  Physical Activity:    Days of Exercise per Week: Not on file   Minutes of Exercise per Session: Not on file  Stress:    Feeling of Stress : Not on file  Social Connections:    Frequency of Communication with Friends and Family: Not on file   Frequency of Social Gatherings with Friends and Family: Not on file   Attends Religious Services: Not on file   Active Member of Clubs or  Organizations: Not on file   Attends Archivist Meetings: Not on file   Marital Status: Not on file     Social history is notable in that he is widowed for 5 years when his wife died at age 34.  He has 2 children ages 20 and 50 and his youngest daughter had a previous brain tumor.  He graduated from [employer].  He works for [employer] in [employer].  He does not smoke cigarettes.  There is a rare cigar use.  He drinks an occasional beer wine scotch or bourbon.  Family History:  The patient's family history includes Heart attack in his brother, brother, and father; Heart disease in his  brother, sister, and sister.   His mother died at age 34.  His father died at age 38 but had a heart attack at age 70.  There are 6 siblings.  Brother age 42 who is alive and well, brother age 56 has a pacemaker and defibrillator, a brother died at age 61 with a heart attack and another brother died at age 51 with a heart attack.  One sister age 75 had a stent at age 61 and another sister at age 66 had bypass surgery at age 10.  ROS General: Negative; No fevers, chills, or night sweats;  HEENT: Negative; No changes in vision or hearing, sinus congestion, difficulty swallowing Pulmonary: Negative; No cough, wheezing, shortness of breath, hemoptysis Cardiovascular: See HPI GI: Negative; No nausea, vomiting, diarrhea, or abdominal pain GU: Negative; No dysuria, hematuria, or difficulty voiding Musculoskeletal: Positive for left radicular pain Hematologic/Oncology: Negative; no easy bruising, bleeding Endocrine: Negative; no heat/cold intolerance; no diabetes Neuro: Negative; no changes in balance, headaches Skin: Negative; No rashes or skin lesions Psychiatric: Negative; No behavioral problems, depression Sleep: Negative; No snoring, daytime sleepiness, hypersomnolence, bruxism, restless legs, hypnogognic hallucinations, no cataplexy Other comprehensive 14 point system review is negative.   PHYSICAL  EXAM:   VS:  BP (!) 142/87    Pulse 76    Temp 98.4 F (36.9 C)    Ht '5\' 9"'$  (1.753 m)    Wt 212 lb 9.6 oz (96.4 kg)    SpO2 100%    BMI 31.40 kg/m     Repeat blood pressure by me was 135/85  Wt Readings from Last 3 Encounters:  02/05/20 212 lb 9.6 oz (96.4 kg)  01/02/20 199 lb 12.8 oz (90.6 kg)    General: Alert, oriented, no distress.  Skin: normal turgor, no rashes, warm and dry HEENT: Normocephalic, atraumatic. Pupils equal round and reactive to light; sclera anicteric; extraocular muscles intact;  Nose without nasal septal hypertrophy lipidemia and possible nonunion Mouth/Parynx benign; Mallinpatti scale 3 Neck: No JVD, no carotid bruits; normal carotid upstroke Lungs: clear to ausculatation and percussion; no wheezing or rales Chest wall: without tenderness to palpitation Heart: PMI not displaced, RRR, s1 s2 normal, 1/6 systolic murmur, no diastolic murmur, no rubs, gallops, thrills, or heaves Abdomen: soft, nontender; no hepatosplenomehaly, BS+; abdominal aorta nontender and not dilated by palpation. Back: no CVA tenderness Pulses 2+ Musculoskeletal: full range of motion, normal strength, no joint deformities Extremities: no clubbing cyanosis or edema, Homan's sign negative  Neurologic: grossly nonfocal; Cranial nerves grossly wnl Psychologic: Normal mood and affect   Studies/Labs Reviewed:   EKG:  EKG is ordered today.  ECG (independently read by me): Normal sinus rhythm at 85 bpm with mild sinus arrhythmia, small inferior Q waves with possible inferior infarct.  QTc interval 428 ms..  Number 150 ms  January 02, 2020 ECG (independently read by me): Normal sinus rhythm at 81 bpm.  Small inferior Q waves in leads III and aVF suggestive of  age-indeterminate inferior infarct.  Early transition.  Normal intervals.  No ectopy  Recent Labs: BMP Latest Ref Rng & Units 02/05/2020 01/28/2020  Glucose 65 - 99 mg/dL 160(H) 107(H)  BUN 8 - 27 mg/dL 15 14  Creatinine 0.76 - 1.27 mg/dL  1.02 1.09  BUN/Creat Ratio 10 - '24 15 13  '$ Sodium 134 - 144 mmol/L 142 141  Potassium 3.5 - 5.2 mmol/L 5.0 5.2  Chloride 96 - 106 mmol/L 101 103  CO2 20 - 29 mmol/L 22 23  Calcium 8.6 - 10.2 mg/dL 9.9 9.9     Hepatic Function Latest Ref Rng & Units 01/28/2020  Total Protein 6.0 - 8.5 g/dL 7.1  Albumin 3.8 - 4.8 g/dL 4.3  AST 0 - 40 IU/L 21  ALT 0 - 44 IU/L 22  Alk Phosphatase 39 - 117 IU/L 56  Total Bilirubin 0.0 - 1.2 mg/dL 0.3    CBC Latest Ref Rng & Units 02/05/2020  WBC 3.4 - 10.8 x10E3/uL 11.2(H)  Hemoglobin 13.0 - 17.7 g/dL 15.9  Hematocrit 37.5 - 51.0 % 46.9  Platelets 150 - 450 x10E3/uL 247   Lab Results  Component Value Date   MCV 91 02/05/2020   No results found for: TSH No results found for: HGBA1C   BNP No results found for: BNP  ProBNP No results found for: PROBNP   Lipid Panel     Component Value Date/Time   CHOL 164 01/28/2020 1043   TRIG 113 01/28/2020 1043   HDL 47 01/28/2020 1043   CHOLHDL 3.5 01/28/2020 1043   LDLCALC 97 01/28/2020 1043   LABVLDL 20 01/28/2020 1043     RADIOLOGY: CT CORONARY MORPH W/CTA COR W/SCORE W/CA W/CM &/OR WO/CM  Addendum Date: 01/31/2020   ADDENDUM REPORT: 01/31/2020 08:40 EXAM: OVER-READ INTERPRETATION  CT CHEST The following report is an over-read performed by radiologist Dr. Barnetta Hammersmith [facility] on 01/31/2020. This over-read does not include interpretation of cardiac or coronary anatomy or pathology. The coronary CTA interpretation by the cardiologist is attached. COMPARISON:  None. FINDINGS: No incidental vascular findings. Visualized mediastinum and hilar regions demonstrate no lymphadenopathy or masses. Visualized lungs demonstrate chronic disease with reticulonodular interstitial prominence bilaterally and scattered areas of scarring. No airspace consolidation, edema, mass, pneumothorax or pleural fluid identified in the visualized lungs. Visualized upper abdomen is unremarkable. Visualized bony  structures are unremarkable. IMPRESSION: Evidence of chronic interstitial lung disease. Electronically Signed   By: Aletta Edouard M.D.   On: 01/31/2020 08:40   Result Date: 01/31/2020 CLINICAL DATA:  Hx of chestpain EXAM: Cardiac/Coronary  CTA TECHNIQUE: The patient was scanned on a Siemens Somatoform go.Top scanner. FINDINGS: A retrospective scan was triggered in the descending thoracic aorta. Axial non-contrast 3 mm slices were carried out through the heart. The data set was analyzed on a dedicated work station and scored using the [place]. Gantry rotation speed was 330 msecs and collimation was .6 mm. '50mg'$  of metoprolol and 0.8 mg of sl NTG was given. The 3D data set was reconstructed in 5% intervals of the 50-95 % of the R-R cycle. Diastolic phases were analyzed on a dedicated work station using MPR, MIP and VRT modes. The patient received 100 cc of contrast. Aorta: Normal size. Ascending and descending aorta calcifications noted. No dissection. Aortic Valve:  Trileaflet.  mild calcifications. Coronary Arteries:  Normal coronary origin.  Right dominance. RCA is a large dominant artery that gives rise to PDA and PLA. There is severe calcified plaque in the proximal segment causing severe disease (>70%) Left main is a large artery that gives rise to LAD and LCX arteries. LAD is a large vessel that is diffusely diseased with severe calcified and non calcified plaque from the proximal to mid segment causing severe stenosis (>70%). LCX is a non-dominant artery that gives rise to one OM1 branch. There is no plaque. Other findings: Normal pulmonary vein drainage into the left atrium. Normal left atrial appendage without a thrombus. Normal size of the pulmonary artery. IMPRESSION: 1. Coronary calcium score  of 2195. This was 98th percentile for age and sex matched control. 2. Normal coronary origin with right dominance. 3. Severe calcified plaque in the proximal RCA and proximal to mid LAD causing severe  stenosis. 4. CAD-RADS 4 Severe stenosis. (70-99% or > 50% left main). Cardiac catheterization or CT FFR is recommended. Consider symptom-guided anti-ischemic pharmacotherapy as well as risk factor modification per guideline directed care. Additional analysis with CT FFR will be submitted and reported separately. Electronically Signed: By: Kate Sable M.D. On: 01/30/2020 16:51   CT CORONARY FRACTIONAL FLOW RESERVE DATA PREP  Result Date: 01/31/2020 EXAM: CT FFR ANALYSIS CLINICAL DATA:  Hx of chestpain FINDINGS: FFRct analysis was performed on the original cardiac CT angiogram dataset. Diagrammatic representation of the FFRct analysis is provided in a separate PDF document in PACS. This dictation was created using the PDF document and an interactive 3D model of the results. 3D model is not available in the EMR/PACS. Normal FFR range is >0.80. 1. Left Main:  No significant stenosis. 2. LAD: Severe stenosis in the proximal to mid LAD.  FFR <0.5 3. LCX: Significant stenosis in LCx, FFR 0.77. 4. RCA: Severe stenosis in the proximal RCA FFR of 0.74, total occ in the mid RCA. IMPRESSION: 1. CT FFR showed significant stenosis involving the LAD, RCA and LCx. Electronically Signed   By: Kate Sable M.D.   On: 01/31/2020 13:26     Additional studies/ records that were reviewed today include:  I reviewed the records from Dr. Marijo File  Laboratory from December 25, 2019: Total cholesterol 318, HDL 45, LDL 240, triglycerides 170 HDL 6.1, consistent with prediabetes Creatinine 1.02.  Echo Doppler study, coronary CTA and FFR analysis were reviewed and thoroughly discussed with the patient.   ASSESSMENT:    1. Angina pectoris ([place])   2. Abnormal cardiac CT angiography   3. Essential hypertension   4. Possible familial hyperlipidemia   5. Abnormal ECG     PLAN:  Jesus Francis is a 64 year old gentleman who has a history of hypertension, significant hyperlipidemia as well as family  history for heart disease.  The patient has had markedly elevated LDL levels in the past suggesting a high likelihood for familial hyperlipidemia.  His most recent lipid panel in December 2020 has shown a total cholesterol of 318 with an LDL cholesterol of 240.  The patient  had experienced an episode where he passed out being and subsequently has noticed some exertional chest pressure.  When I saw him for initial evaluation his ECG showed evidence for inferior Q waves with early transition suggesting the possibility that he may have had an inferior posterior MI in the past.  He had recently been found to have stage II hypertension and was started on valsartan 160 mg daily.  At his initial evaluation with me I reviewed new hypertensive guidelines and with his chest discomfort and initially low-dose metoprolol succinate at 25 mg daily.  He has felt moved sensitive therapy.  I reviewed his echo Doppler study with LAD today which shows normal systolic function without wall motion abnormality.  There is increased left ventricular septal wall thickness and there were no regional wall motion abnormalities.  His coronary CTA is significantly abnormal with a calcium score at 2195 he was found to have significant plaque in the LAD and RCA.  FFR analysis suggests hemodynamic significance in his LAD RCA and left circumflex and suggest possible total mid RCA occlusion.  He chest CT over read suggests interstitial  lung disease with reticular nodular interstitial prominence bilaterally and scattered areas of scarring.  There is no airspace consolidation.  I had a long discussion with him in the office today.  I have recommended definitive diagnostic cardiac catheterization to delineate his coronary anatomy.  I discussed most of the time we can do this successfully through the radial approach but in alternative is the femoral approach if needed.The risks and benefits of a cardiac catheterization including, but not limited to,  death, stroke, MI, kidney damage and bleeding were discussed with the patient who indicates understanding and agrees to proceed.  I have recommended the addition of Zetia 10 mg to his rosuvastatin 40 mg and attempt to be aggressive with lipid management.  I have recommended further titration of Toprol-XL to 50 mg as well as initiating isosorbide mononitrate 30 mg. Previous laboratories also suggested prediabetes with a hemoglobin A1c of 6.1. Catheterization will be scheduled for February 11, 2020 with possible intervention if indicated.     Medication Adjustments/Labs and Tests Ordered: Current medicines are reviewed at length with the patient today.  Concerns regarding medicines are outlined above.  Medication changes, Labs and Tests ordered today are listed in the Patient Instructions below. Patient Instructions  Medication Instructions:  BEGIN ZETIA '10MG'$  DAILY (1 TABLET DAILY) INCREASE METOPROLOL TO '50MG'$  DAILY ( 1 TABLET TWICE A DAY FOR A TOTAL OF '50MG'$ ) BEGIN TAKING ISOSORBIDE '30MG'$  DAILY IN THE MORNING (1 TABLET IN THE MORNING) *If you need a refill on your cardiac medications before your next appointment, please call your pharmacy*  Lab Work: CBC, BMET If you have labs (blood work) drawn today and your tests are completely normal, you will receive your results only by:  MyChart Message (if you have MyChart) OR  A paper copy in the mail If you have any lab test that is abnormal or we need to change your treatment, we will call you to review the results.  Testing/Procedures: CATH SCHEDULED FOR Tuesday THE 16TH AT 9 AM  Follow-Up:  FOLLOW UP POST CATH At [facility], you and your health needs are our priority.  As part of our continuing mission to provide you with exceptional heart care, we have created designated Provider Care Teams.  These Care Teams include your primary Cardiologist (physician) and Advanced Practice Providers (APPs -  Physician Assistants and Nurse Practitioners)  who all work together to provide you with the care you need, when you need it.           [address] [place] [address] Dept: [phone] Loc: [place]  02/05/2020  You are scheduled for a Cardiac Catheterization on Tuesday, February 16 with Dr. Shelva Majestic.  1. Please arrive at the [facility] (Main Entrance A) at [facility]: [address] [place], [place] [address] at 7:00 AM (This time is two hours before your procedure to ensure your preparation). Free valet parking service is available.   Special note: Every effort is made to have your procedure done on time. Please understand that emergencies sometimes delay scheduled procedures.  2. Diet: Do not eat solid foods after midnight.  The patient may have clear liquids until 5am upon the day of the procedure.  3. Labs:  BMET and CBC by the end of this week  On the morning of your procedure, take your Aspirin and any morning medicines NOT listed above.  You may use sips  of water.  5. Plan for one night stay--bring personal belongings. 6. Bring a current list of your medications and current insurance cards. 7. You MUST have a responsible person to drive you home. 8. Someone MUST be with you the first 24 hours after you arrive home or your discharge will be delayed. 9. Please wear clothes that are easy to get on and off and wear slip-on shoes.  Thank you for allowing [place] to care for you!   -- [place] [facility]     Signed, Shelva Majestic, MD  02/10/2020 [place] [address] [place], [place], [place]  [address] Phone: [phone]  He had discontinued this nature recommended with the spironolactone and that I would get that maybe about a week or 2 his digestive follow-up make sure the potassium does not allow

## 2020-02-06 ENCOUNTER — Other Ambulatory Visit: Payer: Self-pay

## 2020-02-06 DIAGNOSIS — I2510 Atherosclerotic heart disease of native coronary artery without angina pectoris: Secondary | ICD-10-CM

## 2020-02-06 LAB — CBC
Hematocrit: 46.9 % (ref 37.5–51.0)
Hemoglobin: 15.9 g/dL (ref 13.0–17.7)
MCH: 30.9 pg (ref 26.6–33.0)
MCHC: 33.9 g/dL (ref 31.5–35.7)
MCV: 91 fL (ref 79–97)
Platelets: 247 10*3/uL (ref 150–450)
RBC: 5.14 x10E6/uL (ref 4.14–5.80)
RDW: 11.9 % (ref 11.6–15.4)
WBC: 11.2 10*3/uL — ABNORMAL HIGH (ref 3.4–10.8)

## 2020-02-06 LAB — BASIC METABOLIC PANEL
BUN/Creatinine Ratio: 15 (ref 10–24)
BUN: 15 mg/dL (ref 8–27)
CO2: 22 mmol/L (ref 20–29)
Calcium: 9.9 mg/dL (ref 8.6–10.2)
Chloride: 101 mmol/L (ref 96–106)
Creatinine, Ser: 1.02 mg/dL (ref 0.76–1.27)
GFR calc Af Amer: 89 mL/min/{1.73_m2} (ref >59)
GFR calc non Af Amer: 77 mL/min/{1.73_m2} (ref >59)
Glucose: 160 mg/dL — ABNORMAL HIGH (ref 65–99)
Potassium: 5 mmol/L (ref 3.5–5.2)
Sodium: 142 mmol/L (ref 134–144)

## 2020-02-08 ENCOUNTER — Other Ambulatory Visit (HOSPITAL_COMMUNITY)
Admission: RE | Admit: 2020-02-08 | Discharge: 2020-02-08 | Disposition: A | Discharge status: Home / Self Care | Payer: 59 | Source: Ambulatory Visit | Attending: Cardiovascular Disease | Admitting: Cardiovascular Disease

## 2020-02-08 DIAGNOSIS — Z01812 Encounter for preprocedural laboratory examination: Secondary | ICD-10-CM | POA: Insufficient documentation

## 2020-02-08 DIAGNOSIS — Z20822 Contact with and (suspected) exposure to covid-19: Secondary | ICD-10-CM | POA: Insufficient documentation

## 2020-02-08 LAB — SARS CORONAVIRUS 2 (TAT 6-24 HRS): SARS Coronavirus 2: NEGATIVE

## 2020-02-10 ENCOUNTER — Telehealth: Payer: Self-pay | Admitting: *Deleted

## 2020-02-10 ENCOUNTER — Encounter: Payer: Self-pay | Admitting: Cardiovascular Disease

## 2020-02-10 NOTE — Telephone Encounter (Signed)
Pt contacted pre-catheterization scheduled at [facility] for: Tuesday February 11, 2020 9 AM Verified arrival time and place: [place] [facility]) at: 7 AM   No solid food after midnight prior to cath, clear liquids until 5 AM day of procedure. Contrast allergy: no   AM meds can be  taken pre-cath with sip of water including: ASA 81 mg   Confirmed patient has responsible adult to drive home post procedure and observe 24 hours after arriving home: yes  Currently, due to Covid-19 pandemic, only one person will be allowed with patient. Must be the same person for patient's entire stay and will be required to wear a mask. They will be asked to wait in the waiting room for the duration of the patient's stay.  Patients are required to wear a mask when they enter the hospital.      COVID-19 Pre-Screening Questions:  . In the past 7 to 10 days have you had a cough,  shortness of breath, headache, congestion, fever (100 or greater) body aches, chills, sore throat, or sudden loss of taste or sense of smell? no . Have you been around anyone with known Covid 19 in past 7-10 days? no . Have you been around anyone who is awaiting Covid 19 test results in the past 7 to 10 days? no . Have you been around anyone who has been exposed to Covid 19, or has mentioned symptoms of Covid 19 within the past 7 to 10 days? no  I reviewed procedure/mask/visitor instructions, COVID-19 screening questions with patient, he verbalized understanding, thanked me for call.

## 2020-02-11 ENCOUNTER — Inpatient Hospital Stay (HOSPITAL_COMMUNITY): Payer: 59

## 2020-02-11 ENCOUNTER — Other Ambulatory Visit: Payer: Self-pay

## 2020-02-11 ENCOUNTER — Inpatient Hospital Stay (HOSPITAL_COMMUNITY)
Admission: AD | Admit: 2020-02-11 | Discharge: 2020-02-17 | DRG: 234 | Disposition: A | Discharge status: Home / Self Care | Payer: 59 | Attending: Thoracic Surgery (Cardiothoracic Vascular Surgery) | Admitting: Thoracic Surgery (Cardiothoracic Vascular Surgery)

## 2020-02-11 ENCOUNTER — Encounter (HOSPITAL_COMMUNITY)
Admission: AD | Disposition: A | Discharge status: Home / Self Care | Payer: Self-pay | Source: Home / Self Care | Attending: Thoracic Surgery (Cardiothoracic Vascular Surgery)

## 2020-02-11 DIAGNOSIS — Z8249 Family history of ischemic heart disease and other diseases of the circulatory system: Secondary | ICD-10-CM | POA: Diagnosis not present

## 2020-02-11 DIAGNOSIS — Z6831 Body mass index (BMI) 31.0-31.9, adult: Secondary | ICD-10-CM | POA: Diagnosis not present

## 2020-02-11 DIAGNOSIS — E669 Obesity, unspecified: Secondary | ICD-10-CM | POA: Diagnosis present

## 2020-02-11 DIAGNOSIS — J90 Pleural effusion, not elsewhere classified: Secondary | ICD-10-CM

## 2020-02-11 DIAGNOSIS — I480 Paroxysmal atrial fibrillation: Secondary | ICD-10-CM | POA: Diagnosis present

## 2020-02-11 DIAGNOSIS — Z20822 Contact with and (suspected) exposure to covid-19: Secondary | ICD-10-CM | POA: Diagnosis present

## 2020-02-11 DIAGNOSIS — I209 Angina pectoris, unspecified: Secondary | ICD-10-CM

## 2020-02-11 DIAGNOSIS — I6523 Occlusion and stenosis of bilateral carotid arteries: Secondary | ICD-10-CM | POA: Diagnosis present

## 2020-02-11 DIAGNOSIS — Z01818 Encounter for other preprocedural examination: Secondary | ICD-10-CM

## 2020-02-11 DIAGNOSIS — Z01810 Encounter for preprocedural cardiovascular examination: Secondary | ICD-10-CM

## 2020-02-11 DIAGNOSIS — I25119 Atherosclerotic heart disease of native coronary artery with unspecified angina pectoris: Principal | ICD-10-CM | POA: Diagnosis present

## 2020-02-11 DIAGNOSIS — Z7982 Long term (current) use of aspirin: Secondary | ICD-10-CM

## 2020-02-11 DIAGNOSIS — E785 Hyperlipidemia, unspecified: Secondary | ICD-10-CM | POA: Diagnosis present

## 2020-02-11 DIAGNOSIS — Z79899 Other long term (current) drug therapy: Secondary | ICD-10-CM

## 2020-02-11 DIAGNOSIS — I2510 Atherosclerotic heart disease of native coronary artery without angina pectoris: Secondary | ICD-10-CM | POA: Diagnosis not present

## 2020-02-11 DIAGNOSIS — Z951 Presence of aortocoronary bypass graft: Secondary | ICD-10-CM

## 2020-02-11 DIAGNOSIS — I10 Essential (primary) hypertension: Secondary | ICD-10-CM | POA: Diagnosis present

## 2020-02-11 DIAGNOSIS — R7303 Prediabetes: Secondary | ICD-10-CM | POA: Diagnosis present

## 2020-02-11 DIAGNOSIS — I25110 Atherosclerotic heart disease of native coronary artery with unstable angina pectoris: Secondary | ICD-10-CM | POA: Diagnosis not present

## 2020-02-11 DIAGNOSIS — Z9889 Other specified postprocedural states: Secondary | ICD-10-CM

## 2020-02-11 DIAGNOSIS — Z09 Encounter for follow-up examination after completed treatment for conditions other than malignant neoplasm: Secondary | ICD-10-CM

## 2020-02-11 DIAGNOSIS — I6522 Occlusion and stenosis of left carotid artery: Secondary | ICD-10-CM | POA: Diagnosis not present

## 2020-02-11 HISTORY — PX: LEFT HEART CATH AND CORONARY ANGIOGRAPHY: CATH118249

## 2020-02-11 LAB — BLOOD GAS, ARTERIAL
Acid-Base Excess: 1.5 mmol/L (ref 0.0–2.0)
Bicarbonate: 25.6 mmol/L (ref 20.0–28.0)
Drawn by: 418751
FIO2: 21
O2 Saturation: 96.6 %
Patient temperature: 37
pCO2 arterial: 41.3 mmHg (ref 32.0–48.0)
pH, Arterial: 7.41 (ref 7.350–7.450)
pO2, Arterial: 81.7 mmHg — ABNORMAL LOW (ref 83.0–108.0)

## 2020-02-11 LAB — ABO/RH: ABO/RH(D): O POS

## 2020-02-11 LAB — CBC
HCT: 41.8 % (ref 39.0–52.0)
Hemoglobin: 14.1 g/dL (ref 13.0–17.0)
MCH: 31.3 pg (ref 26.0–34.0)
MCHC: 33.7 g/dL (ref 30.0–36.0)
MCV: 92.7 fL (ref 80.0–100.0)
Platelets: 217 10*3/uL (ref 150–400)
RBC: 4.51 MIL/uL (ref 4.22–5.81)
RDW: 12 % (ref 11.5–15.5)
WBC: 11.4 10*3/uL — ABNORMAL HIGH (ref 4.0–10.5)
nRBC: 0 % (ref 0.0–0.2)

## 2020-02-11 LAB — HEMOGLOBIN A1C
Hgb A1c MFr Bld: 6.4 % — ABNORMAL HIGH (ref 4.8–5.6)
Mean Plasma Glucose: 136.98 mg/dL

## 2020-02-11 LAB — HEPARIN LEVEL (UNFRACTIONATED): Heparin Unfractionated: 0.2 IU/mL — ABNORMAL LOW (ref 0.30–0.70)

## 2020-02-11 LAB — PREPARE RBC (CROSSMATCH)

## 2020-02-11 SURGERY — LEFT HEART CATH AND CORONARY ANGIOGRAPHY
Anesthesia: LOCAL

## 2020-02-11 MED ORDER — POTASSIUM CHLORIDE 2 MEQ/ML IV SOLN
80.0000 meq | INTRAVENOUS | Status: DC
  Filled 2020-02-11: qty 40

## 2020-02-11 MED ORDER — BISACODYL 5 MG PO TBEC
5.0000 mg | DELAYED_RELEASE_TABLET | Freq: Once | ORAL | Status: AC
  Administered 2020-02-11: 14:00:00 5 mg via ORAL
  Filled 2020-02-11: qty 1

## 2020-02-11 MED ORDER — ASPIRIN EC 81 MG PO TBEC: 81.0000 mg | DELAYED_RELEASE_TABLET | Freq: Every day | ORAL | Status: DC

## 2020-02-11 MED ORDER — NITROGLYCERIN IN D5W 200-5 MCG/ML-% IV SOLN
2.0000 ug/min | INTRAVENOUS | Status: AC
  Administered 2020-02-12: 14:00:00 16.6 ug/min via INTRAVENOUS
  Filled 2020-02-11: qty 250

## 2020-02-11 MED ORDER — SODIUM CHLORIDE 0.9 % IV SOLN: INTRAVENOUS | Status: DC

## 2020-02-11 MED ORDER — METOPROLOL TARTRATE 12.5 MG HALF TABLET
12.5000 mg | ORAL_TABLET | Freq: Once | ORAL | Status: AC
  Administered 2020-02-12: 06:00:00 12.5 mg via ORAL
  Filled 2020-02-11: qty 1

## 2020-02-11 MED ORDER — TEMAZEPAM 15 MG PO CAPS: 15.0000 mg | ORAL_CAPSULE | Freq: Once | ORAL | Status: DC | PRN

## 2020-02-11 MED ORDER — SODIUM CHLORIDE 0.9% FLUSH: 3.0000 mL | Freq: Two times a day (BID) | INTRAVENOUS | Status: DC

## 2020-02-11 MED ORDER — INSULIN REGULAR(HUMAN) IN NACL 100-0.9 UT/100ML-% IV SOLN
INTRAVENOUS | Status: AC
  Administered 2020-02-12: 1.6 [IU]/h via INTRAVENOUS
  Filled 2020-02-11: qty 100

## 2020-02-11 MED ORDER — FENTANYL CITRATE (PF) 100 MCG/2ML IJ SOLN
INTRAMUSCULAR | Status: AC
  Filled 2020-02-11: qty 2

## 2020-02-11 MED ORDER — LIDOCAINE HCL (PF) 1 % IJ SOLN
INTRAMUSCULAR | Status: DC | PRN
  Administered 2020-02-11: 2 mL

## 2020-02-11 MED ORDER — ACETAMINOPHEN 325 MG PO TABS: 650.0000 mg | ORAL_TABLET | ORAL | Status: DC | PRN

## 2020-02-11 MED ORDER — IOHEXOL 350 MG/ML SOLN
INTRAVENOUS | Status: DC | PRN
  Administered 2020-02-11: 11:00:00 90 mL

## 2020-02-11 MED ORDER — SODIUM CHLORIDE 0.9 % IV SOLN
INTRAVENOUS | Status: DC
  Filled 2020-02-11: qty 30

## 2020-02-11 MED ORDER — ONDANSETRON HCL 4 MG/2ML IJ SOLN: 4.0000 mg | Freq: Four times a day (QID) | INTRAMUSCULAR | Status: DC | PRN

## 2020-02-11 MED ORDER — METOPROLOL SUCCINATE ER 25 MG PO TB24
25.0000 mg | ORAL_TABLET | Freq: Two times a day (BID) | ORAL | Status: DC
  Administered 2020-02-11: 25 mg via ORAL
  Filled 2020-02-11: qty 1

## 2020-02-11 MED ORDER — MIDAZOLAM HCL 2 MG/2ML IJ SOLN
INTRAMUSCULAR | Status: AC
  Filled 2020-02-11: qty 2

## 2020-02-11 MED ORDER — CHLORHEXIDINE GLUCONATE CLOTH 2 % EX PADS
6.0000 | MEDICATED_PAD | Freq: Once | CUTANEOUS | Status: AC
  Administered 2020-02-12: 05:00:00 6 via TOPICAL

## 2020-02-11 MED ORDER — NOREPINEPHRINE 4 MG/250ML-% IV SOLN
0.0000 ug/min | INTRAVENOUS | Status: DC
  Filled 2020-02-11 (×2): qty 250

## 2020-02-11 MED ORDER — HEPARIN SODIUM (PORCINE) 1000 UNIT/ML IJ SOLN
INTRAMUSCULAR | Status: AC
  Filled 2020-02-11: qty 1

## 2020-02-11 MED ORDER — ASPIRIN 81 MG PO CHEW: 81.0000 mg | CHEWABLE_TABLET | ORAL | Status: DC

## 2020-02-11 MED ORDER — MIDAZOLAM HCL 2 MG/2ML IJ SOLN
INTRAMUSCULAR | Status: DC | PRN
  Administered 2020-02-11: 1 mg via INTRAVENOUS
  Administered 2020-02-11: 2 mg via INTRAVENOUS

## 2020-02-11 MED ORDER — CHLORHEXIDINE GLUCONATE 0.12 % MT SOLN
15.0000 mL | Freq: Once | OROMUCOSAL | Status: AC
  Administered 2020-02-12: 06:00:00 15 mL via OROMUCOSAL
  Filled 2020-02-11: qty 15

## 2020-02-11 MED ORDER — SODIUM CHLORIDE 0.9% FLUSH: 3.0000 mL | INTRAVENOUS | Status: DC | PRN

## 2020-02-11 MED ORDER — DEXMEDETOMIDINE HCL IN NACL 400 MCG/100ML IV SOLN
0.1000 ug/kg/h | INTRAVENOUS | Status: AC
  Administered 2020-02-12: 09:00:00 .7 ug/kg/h via INTRAVENOUS
  Filled 2020-02-11 (×2): qty 100

## 2020-02-11 MED ORDER — VERAPAMIL HCL 2.5 MG/ML IV SOLN
INTRAVENOUS | Status: DC | PRN
  Administered 2020-02-11: 10 mL via INTRA_ARTERIAL

## 2020-02-11 MED ORDER — VANCOMYCIN HCL 1500 MG/300ML IV SOLN
1500.0000 mg | INTRAVENOUS | Status: AC
  Administered 2020-02-12: 09:00:00 1500 mg via INTRAVENOUS
  Filled 2020-02-11 (×2): qty 300

## 2020-02-11 MED ORDER — HEPARIN (PORCINE) IN NACL 1000-0.9 UT/500ML-% IV SOLN
INTRAVENOUS | Status: DC | PRN
  Administered 2020-02-11: 500 mL

## 2020-02-11 MED ORDER — SODIUM CHLORIDE 0.9 % IV SOLN
1.5000 g | INTRAVENOUS | Status: AC
  Administered 2020-02-12: 09:00:00 1.5 g via INTRAVENOUS
  Administered 2020-02-12: 14:00:00 .75 g via INTRAVENOUS
  Filled 2020-02-11: qty 1.5

## 2020-02-11 MED ORDER — TRANEXAMIC ACID (OHS) PUMP PRIME SOLUTION
2.0000 mg/kg | INTRAVENOUS | Status: DC
  Filled 2020-02-11: qty 1.86

## 2020-02-11 MED ORDER — HYDRALAZINE HCL 20 MG/ML IJ SOLN: 10.0000 mg | INTRAMUSCULAR | Status: AC | PRN

## 2020-02-11 MED ORDER — MANNITOL 20 % IV SOLN
Freq: Once | INTRAVENOUS | Status: DC
  Filled 2020-02-11: qty 13

## 2020-02-11 MED ORDER — LABETALOL HCL 5 MG/ML IV SOLN: 10.0000 mg | INTRAVENOUS | Status: AC | PRN

## 2020-02-11 MED ORDER — PHENYLEPHRINE HCL-NACL 20-0.9 MG/250ML-% IV SOLN
30.0000 ug/min | INTRAVENOUS | Status: AC
  Administered 2020-02-12: 09:00:00 20 ug/min via INTRAVENOUS
  Filled 2020-02-11 (×2): qty 250

## 2020-02-11 MED ORDER — ROSUVASTATIN CALCIUM 20 MG PO TABS
40.0000 mg | ORAL_TABLET | Freq: Every evening | ORAL | Status: DC
  Administered 2020-02-11 – 2020-02-16 (×5): 40 mg via ORAL
  Filled 2020-02-11 (×6): qty 2

## 2020-02-11 MED ORDER — HEPARIN (PORCINE) IN NACL 1000-0.9 UT/500ML-% IV SOLN
INTRAVENOUS | Status: AC
  Filled 2020-02-11: qty 1000

## 2020-02-11 MED ORDER — DIAZEPAM 5 MG PO TABS: 5.0000 mg | ORAL_TABLET | Freq: Four times a day (QID) | ORAL | Status: DC | PRN

## 2020-02-11 MED ORDER — ASPIRIN 81 MG PO CHEW: 81.0000 mg | CHEWABLE_TABLET | Freq: Every day | ORAL | Status: DC

## 2020-02-11 MED ORDER — TRANEXAMIC ACID 1000 MG/10ML IV SOLN
1.5000 mg/kg/h | INTRAVENOUS | Status: AC
  Administered 2020-02-12: 11:00:00 1.5 mg/kg/h via INTRAVENOUS
  Filled 2020-02-11: qty 25

## 2020-02-11 MED ORDER — CHLORHEXIDINE GLUCONATE CLOTH 2 % EX PADS
6.0000 | MEDICATED_PAD | Freq: Once | CUTANEOUS | Status: AC
  Administered 2020-02-11: 6 via TOPICAL

## 2020-02-11 MED ORDER — PLASMA-LYTE 148 IV SOLN
INTRAVENOUS | Status: DC
  Filled 2020-02-11: qty 2.5

## 2020-02-11 MED ORDER — MILRINONE LACTATE IN DEXTROSE 20-5 MG/100ML-% IV SOLN
0.3000 ug/kg/min | INTRAVENOUS | Status: DC
  Filled 2020-02-11: qty 100

## 2020-02-11 MED ORDER — SODIUM CHLORIDE 0.9 % IV SOLN
750.0000 mg | INTRAVENOUS | Status: DC
  Filled 2020-02-11: qty 750

## 2020-02-11 MED ORDER — LIDOCAINE HCL (PF) 1 % IJ SOLN
INTRAMUSCULAR | Status: AC
  Filled 2020-02-11: qty 30

## 2020-02-11 MED ORDER — ISOSORBIDE MONONITRATE ER 30 MG PO TB24
30.0000 mg | ORAL_TABLET | Freq: Every day | ORAL | Status: DC
  Filled 2020-02-11: qty 1

## 2020-02-11 MED ORDER — EZETIMIBE 10 MG PO TABS
10.0000 mg | ORAL_TABLET | Freq: Every day | ORAL | Status: DC
  Administered 2020-02-11 – 2020-02-17 (×6): 10 mg via ORAL
  Filled 2020-02-11 (×6): qty 1

## 2020-02-11 MED ORDER — EPINEPHRINE HCL 5 MG/250ML IV SOLN IN NS
0.0000 ug/min | INTRAVENOUS | Status: DC
  Filled 2020-02-11 (×2): qty 250

## 2020-02-11 MED ORDER — FENTANYL CITRATE (PF) 100 MCG/2ML IJ SOLN
INTRAMUSCULAR | Status: DC | PRN
  Administered 2020-02-11 (×2): 25 ug via INTRAVENOUS

## 2020-02-11 MED ORDER — HEPARIN (PORCINE) 25000 UT/250ML-% IV SOLN
1350.0000 [IU]/h | INTRAVENOUS | Status: DC
  Administered 2020-02-11: 1150 [IU]/h via INTRAVENOUS
  Filled 2020-02-11: qty 250

## 2020-02-11 MED ORDER — SODIUM CHLORIDE 0.9 % IV SOLN: 250.0000 mL | INTRAVENOUS | Status: DC | PRN

## 2020-02-11 MED ORDER — VERAPAMIL HCL 2.5 MG/ML IV SOLN
INTRAVENOUS | Status: AC
  Filled 2020-02-11: qty 2

## 2020-02-11 MED ORDER — HEPARIN SODIUM (PORCINE) 1000 UNIT/ML IJ SOLN
INTRAMUSCULAR | Status: DC | PRN
  Administered 2020-02-11: 4500 [IU] via INTRAVENOUS

## 2020-02-11 MED ORDER — TRANEXAMIC ACID (OHS) BOLUS VIA INFUSION
15.0000 mg/kg | INTRAVENOUS | Status: AC
  Administered 2020-02-12: 11:00:00 1395 mg via INTRAVENOUS
  Filled 2020-02-11: qty 1395

## 2020-02-11 MED ORDER — SODIUM CHLORIDE 0.9% FLUSH
3.0000 mL | INTRAVENOUS | Status: DC | PRN
  Administered 2020-02-11: 3 mL via INTRAVENOUS

## 2020-02-11 SURGICAL SUPPLY — 13 items
CATH INFINITI 5FR ANG PIGTAIL (CATHETERS) ×1 IMPLANT
CATH INFINITI 5FR JL4 (CATHETERS) ×1 IMPLANT
CATH INFINITI JR4 5F (CATHETERS) ×1 IMPLANT
CATH LAUNCHER 6FR EBU 3 (CATHETERS) ×1 IMPLANT
CATH OPTITORQUE TIG 4.0 5F (CATHETERS) ×1 IMPLANT
DEVICE RAD COMP TR BAND LRG (VASCULAR PRODUCTS) ×1 IMPLANT
GLIDESHEATH SLEND SS 6F .021 (SHEATH) ×1 IMPLANT
GUIDEWIRE INQWIRE 1.5J.035X260 (WIRE) IMPLANT
INQWIRE 1.5J .035X260CM (WIRE) ×2
KIT HEART LEFT (KITS) ×2 IMPLANT
PACK CARDIAC CATHETERIZATION (CUSTOM PROCEDURE TRAY) ×2 IMPLANT
TRANSDUCER W/STOPCOCK (MISCELLANEOUS) ×2 IMPLANT
TUBING CIL FLEX 10 FLL-RA (TUBING) ×2 IMPLANT

## 2020-02-11 NOTE — Interval H&P Note (Signed)
Cath Lab Visit (complete for each Cath Lab visit)  Clinical Evaluation Leading to the Procedure:   ACS: No.  Non-ACS:    Anginal Classification: CCS III  Anti-ischemic medical therapy: Maximal Therapy (2 or more classes of medications)  Non-Invasive Test Results: High-risk stress test findings: cardiac mortality >3%/year  Prior CABG: No previous CABG      History and Physical Interval Note:  02/11/2020 9:45 AM  Glendell Docker  has presented today for surgery, with the diagnosis of abnormal ct   cad.  The various methods of treatment have been discussed with the patient and family. After consideration of risks, benefits and other options for treatment, the patient has consented to  Procedure(s): LEFT HEART CATH AND CORONARY ANGIOGRAPHY (N/A) as a surgical intervention.  The patient's history has been reviewed, patient examined, no change in status, stable for surgery.  I have reviewed the patient's chart and labs.  Questions were answered to the patient's satisfaction.     Shelva Majestic

## 2020-02-11 NOTE — H&P (View-Only) (Signed)
ASSESSMENT & PLAN   GREATER THAN 80% SYMPTOMATIC LEFT CAROTID STENOSIS: This patient is scheduled for coronary revascularization tomorrow.  He has a very tight left carotid stenosis with a moderate (40 to 59%) right carotid stenosis.  He essentially has a string sign on the left.  He did describe one episode 6 months ago in which his right arm suddenly became weak and numb.  This lasted only briefly.  Thus I think this is potentially a symptomatic left carotid stenosis.  I have recommended left carotid endarterectomy combined with CABG in order to lower his risk of perioperative and future stroke.  We have discussed the indications for the procedure and the potential complications including a 1 to 2% risk of perioperative stroke.  I have discussed this with Dr. Donnetta Hutching who will be doing the surgery and also with Dr. Kipp Brood.  We are all in agreement with this plan.  I discussed the surgery with the patient and his wife tonight.  REASON FOR CONSULT:    Bilateral carotid disease.  The consult is requested by Dr. Kipp Brood.  HPI:   Jesus Francis is a 64 y.o. male who was being worked up for exertional angina.  He underwent cardiac catheterization today and was found to have multivessel coronary artery disease.  CABG was recommended.  His preoperative work-up included a carotid duplex scan which showed a very tight left carotid stenosis and for this reason vascular surgery was consulted.  The patient is right-handed although he writes with his left hand.  He denies any previous history of stroke.  He does describe an episode 6 months ago in which suddenly his right arm became weak and numb and he dropped a cup.  The symptoms lasted only briefly and has had no further symptoms.  He did have an episode when he passed out in November but had no focal weakness or paresthesias associated with this.  He denies any history of expressive or receptive aphasia.  He denies any history of amaurosis  fugax.  His risk factors for peripheral vascular disease include hypertension, hypercholesterolemia, and a strong family history of premature cardiovascular disease.  He denies any history of diabetes and denies any tobacco use.  He had a left upper extremity DVT when he was in his 55s.  He denies any history of claudication, rest pain, or nonhealing ulcers.  He is on aspirin and is on a statin.  No past medical history on file.  Family History  Problem Relation Age of Onset  . Heart attack Father   . Heart disease Sister   . Heart disease Brother   . Heart attack Brother   . Heart attack Brother   . Heart disease Sister     SOCIAL HISTORY: Social History   Tobacco Use  . Smoking status: Never Smoker  . Smokeless tobacco: Never Used  Substance Use Topics  . Alcohol use: Yes    Alcohol/week: 3.0 standard drinks    Types: 1 Glasses of wine, 1 Shots of liquor, 1 Cans of beer per week    No Known Allergies  Current Facility-Administered Medications  Medication Dose Route Frequency Provider Last Rate Last Admin  . 0.9 %  sodium chloride infusion   Intravenous Continuous Troy Sine, MD   Stopped at 02/11/20 1751  . 0.9 %  sodium chloride infusion  250 mL Intravenous PRN Troy Sine, MD      . acetaminophen (TYLENOL) tablet 650 mg  650 mg Oral Q4H PRN  Troy Sine, MD      . Derrill Memo ON 02/12/2020] aspirin chewable tablet 81 mg  81 mg Oral Daily Troy Sine, MD      . Derrill Memo ON 02/12/2020] cefUROXime (ZINACEF) 1.5 g in sodium chloride 0.9 % 100 mL IVPB  1.5 g Intravenous To OR Lajuana Matte, MD      . Derrill Memo ON 02/12/2020] cefUROXime (ZINACEF) 750 mg in sodium chloride 0.9 % 100 mL IVPB  750 mg Intravenous To OR Lightfoot, Lucile Crater, MD      . Derrill Memo ON 02/12/2020] chlorhexidine (PERIDEX) 0.12 % solution 15 mL  15 mL Mouth/Throat Once Lightfoot, Lucile Crater, MD      . Chlorhexidine Gluconate Cloth 2 % PADS 6 each  6 each Topical Once Lajuana Matte, MD        And  . Chlorhexidine Gluconate Cloth 2 % PADS 6 each  6 each Topical Once Lajuana Matte, MD      . Derrill Memo ON 02/12/2020] dexmedetomidine (PRECEDEX) 400 MCG/100ML (4 mcg/mL) infusion  0.1-0.7 mcg/kg/hr Intravenous To OR Lightfoot, Harrell O, MD      . diazepam (VALIUM) tablet 5 mg  5 mg Oral Q6H PRN Troy Sine, MD      . Derrill Memo ON 02/12/2020] EPINEPHrine (ADRENALIN) 4 mg in NS 250 mL (0.016 mg/mL) premix infusion  0-10 mcg/min Intravenous To OR Lightfoot, Harrell O, MD      . ezetimibe (ZETIA) tablet 10 mg  10 mg Oral Daily Troy Sine, MD   10 mg at 02/11/20 1346  . [START ON 02/12/2020] heparin 2,500 Units, papaverine 30 mg in electrolyte-148 (PLASMALYTE-148) 500 mL irrigation   Irrigation To OR Lightfoot, Lucile Crater, MD      . Derrill Memo ON 02/12/2020] heparin 30,000 units/NS 1000 mL solution for CELLSAVER   Other To OR Lightfoot, Harrell O, MD      . heparin ADULT infusion 100 units/mL (25000 units/263mL sodium chloride 0.45%)  1,150 Units/hr Intravenous Continuous Einar Grad, RPH 11.5 mL/hr at 02/11/20 1814 1,150 Units/hr at 02/11/20 1814  . [START ON 02/12/2020] insulin regular, human (MYXREDLIN) 100 units/ 100 mL infusion   Intravenous To OR Lightfoot, Harrell O, MD      . isosorbide mononitrate (IMDUR) 24 hr tablet 30 mg  30 mg Oral Daily Troy Sine, MD      . Derrill Memo ON 02/12/2020] [place] Blood Cardioplegia vial (lidocaine/magnesium/mannitol 0.26g-4g-6.4g)   Intracoronary Once Lajuana Matte, MD      . metoprolol succinate (TOPROL-XL) 24 hr tablet 25 mg  25 mg Oral BID Troy Sine, MD      . Derrill Memo ON 02/12/2020] metoprolol tartrate (LOPRESSOR) tablet 12.5 mg  12.5 mg Oral Once Lajuana Matte, MD      . Derrill Memo ON 02/12/2020] milrinone (PRIMACOR) 20 MG/100 ML (0.2 mg/mL) infusion  0.3 mcg/kg/min Intravenous To OR Lajuana Matte, MD      . Derrill Memo ON 02/12/2020] nitroGLYCERIN 50 mg in dextrose 5 % 250 mL (0.2 mg/mL) infusion  2-200 mcg/min Intravenous To  OR Lajuana Matte, MD      . Derrill Memo ON 02/12/2020] norepinephrine (LEVOPHED) 4mg  in 275mL premix infusion  0-40 mcg/min Intravenous To OR Lightfoot, Lucile Crater, MD      . ondansetron (ZOFRAN) injection 4 mg  4 mg Intravenous Q6H PRN Troy Sine, MD      . Derrill Memo ON 02/12/2020] phenylephrine (NEOSYNEPHRINE) 20-0.9 MG/250ML-% infusion  30-200 mcg/min Intravenous To OR Melodie Bouillon  O, MD      . Derrill Memo ON 02/12/2020] potassium chloride injection 80 mEq  80 mEq Other To OR Lightfoot, Harrell O, MD      . rosuvastatin (CRESTOR) tablet 40 mg  40 mg Oral QPM Troy Sine, MD   40 mg at 02/11/20 1811  . sodium chloride flush (NS) 0.9 % injection 3 mL  3 mL Intravenous Q12H Shelva Majestic A, MD      . sodium chloride flush (NS) 0.9 % injection 3 mL  3 mL Intravenous PRN Troy Sine, MD      . temazepam (RESTORIL) capsule 15 mg  15 mg Oral Once PRN Lajuana Matte, MD      . Derrill Memo ON 02/12/2020] tranexamic acid (CYKLOKAPRON) 2,500 mg in sodium chloride 0.9 % 250 mL (10 mg/mL) infusion  1.5 mg/kg/hr Intravenous To OR Lajuana Matte, MD      . Derrill Memo ON 02/12/2020] tranexamic acid (CYKLOKAPRON) bolus via infusion - over 30 minutes 1,395 mg  15 mg/kg Intravenous To OR Lajuana Matte, MD      . Derrill Memo ON 02/12/2020] tranexamic acid (CYKLOKAPRON) pump prime solution 186 mg  2 mg/kg Intracatheter To OR Lightfoot, Lucile Crater, MD      . Derrill Memo ON 02/12/2020] vancomycin (VANCOREADY) IVPB 1500 mg/300 mL  1,500 mg Intravenous To OR Lightfoot, Lucile Crater, MD        REVIEW OF SYSTEMS:  [X]  denotes positive finding, [ ]  denotes negative finding Cardiac  Comments:  Chest pain or chest pressure: x   Shortness of breath upon exertion: x   Short of breath when lying flat:    Irregular heart rhythm:        Vascular    Pain in calf, thigh, or hip brought on by ambulation:    Pain in feet at night that wakes you up from your sleep:     Blood clot in your veins:    Leg swelling:          Pulmonary    Oxygen at home:    Productive cough:     Wheezing:         Neurologic    Sudden weakness in arms or legs:     Sudden numbness in arms or legs:     Sudden onset of difficulty speaking or slurred speech:    Temporary loss of vision in one eye:     Problems with dizziness:         Gastrointestinal    Blood in stool:     Vomited blood:         Genitourinary    Burning when urinating:     Blood in urine:        Psychiatric    Major depression:         Hematologic    Bleeding problems:    Problems with blood clotting too easily:        Skin    Rashes or ulcers:        Constitutional    Fever or chills:    -  PHYSICAL EXAM:   Vitals:   02/11/20 1335 02/11/20 1410 02/11/20 1435 02/11/20 1505  BP: (!) 152/97 (!) 149/111 (!) 156/90 132/80  Pulse: 72 71 72 67  Resp:      Temp:  97.6 F (36.4 C)    TempSrc:  Oral    SpO2: 99% 99% 98% 98%  Weight:      Height:  Body mass index is 30.27 kg/m. GENERAL: The patient is a well-nourished male, in no acute distress. The vital signs are documented above. CARDIAC: There is a regular rate and rhythm.  VASCULAR: I do not detect carotid bruits. He has palpable radial, femoral, popliteal, dorsalis pedis, and posterior tibial pulses bilaterally. He has no significant lower extremity swelling. PULMONARY: There is good air exchange bilaterally without wheezing or rales. ABDOMEN: Soft and non-tender with normal pitched bowel sounds.  I do not palpate an abdominal aortic aneurysm. MUSCULOSKELETAL: There are no major deformities. NEUROLOGIC: No focal weakness or paresthesias are detected. SKIN: There are no ulcers or rashes noted. PSYCHIATRIC: The patient has a normal affect.  DATA:    Lab Results  Component Value Date   WBC 11.2 (H) 02/05/2020   HGB 15.9 02/05/2020   HCT 46.9 02/05/2020   MCV 91 02/05/2020   PLT 247 02/05/2020   Lab Results  Component Value Date   NA 142 02/05/2020   K 5.0 02/05/2020    CL 101 02/05/2020   CO2 22 02/05/2020   Lab Results  Component Value Date   CREATININE 1.02 02/05/2020   CAROTID DUPLEX: I have independently interpreted his carotid duplex scan.  On the left side, he has a greater than 80% stenosis.  Peak systolic velocities Q000111Q cm/s with an end-diastolic velocity Q000111Q cm/s.  He is noted to have a "string sign."  Left vertebral artery is patent with antegrade flow.  On the right side he has a 40 to 59% stenosis.  The right vertebral artery is patent with antegrade flow.  LOWER EXTREMITY ARTERIAL DOPPLER STUDY: I have independently interpreted his lower extremity arterial Doppler study.  He has triphasic Doppler signals in the right foot with an ABI of 100%.  He has triphasic Doppler signals in the left foot with an ABI of 97%.  Deitra [facility]: [phone] Office: [phone]

## 2020-02-11 NOTE — Consult Note (Signed)
[address]       [place] [address]             [phone]        Jesus Francis [facility] [id number] Date of Birth: May 26, 1956  Referring: No ref. provider found Primary Care: Antony Contras, MD Primary Cardiologist:No primary care provider on file.  Chief Complaint:   No chief complaint on file.   History of Present Illness:     64 yo male is admitted to the hospital following and elective LHC.  He has a history of exertional angina, and an abnormal coronary CT scan.  He admits to several episodes dating back to Thanksgivings of last year.  CTS has been consulted to assist with management.   Past Medical and Surgical History: Previous Chest Surgery: no Previous Chest Radiation: no Diabetes Mellitus: no.  HbA1C 6.1 Creatinine: 1.02  Past medical history is notable for hypertension, hyperlipidemia, history of kidney stones, history of internal hemorrhoids, remote history of hematuria with negative work-up by urology, history of carpal tunnel syndrome status post bilateral release, previous documentation of aortoiliac atherosclerosis seen on CT imaging in 2019.  Past surgical history is notable for his carpal tunnel release, hemorrhoidal surgery, as well as vocal cord surgery.  Social History: Support: widower.  Has 2 adult children  Social History   Tobacco Use  Smoking Status Never Smoker  Smokeless Tobacco Never Used    Social History   Substance and Sexual Activity  Alcohol Use Yes  . Alcohol/week: 3.0 standard drinks  . Types: 1 Glasses of wine, 1 Shots of liquor, 1 Cans of beer per week     No Known Allergies  Medications: Asprin: y Statin: y Beta Blocker: y Ace Inhibitor: n Anti-Coagulation: n  Current Facility-Administered Medications  Medication Dose Route Frequency Provider Last Rate Last Admin  . 0.9 %  sodium chloride infusion   Intravenous Continuous Troy Sine, MD 75 mL/hr at 02/11/20 0821 New  Bag at 02/11/20 [id number]  . 0.9 %  sodium chloride infusion   Intravenous Continuous Troy Sine, MD      . aspirin chewable tablet 81 mg  81 mg Oral Ruben Im A, MD      . sodium chloride flush (NS) 0.9 % injection 3 mL  3 mL Intravenous PRN Troy Sine, MD        Medications Prior to Admission  Medication Sig Dispense Refill Last Dose  . aspirin EC 81 MG tablet Take 81 mg by mouth at bedtime.    02/11/2020 at 0615  . ezetimibe (ZETIA) 10 MG tablet Take 1 tablet (10 mg total) by mouth daily. (Patient taking differently: Take 10 mg by mouth every evening. ) 90 tablet 3 02/10/2020 at 2000  . ibuprofen (ADVIL) 200 MG tablet Take 400 mg by mouth every 8 (eight) hours as needed (pain).   Past Month at Unknown time  . isosorbide mononitrate (IMDUR) 30 MG 24 hr tablet Take 1 tablet (30 mg total) by mouth daily. In the morning 30 tablet 2 Past Week at Unknown time  . metoprolol succinate (TOPROL XL) 25 MG 24 hr tablet Take 1 tablet (25 mg total) by mouth 2 (two) times daily. 90 tablet 3 02/11/2020 at 0615  . nitroGLYCERIN (NITROSTAT) 0.4 MG SL tablet Place 1 tablet (0.4 mg total) under the tongue every 5 (five) minutes as needed for chest pain. (Patient taking differently: Place 0.4 mg under the  tongue every 5 (five) minutes x 3 doses as needed for chest pain. ) 90 tablet 3   . rosuvastatin (CRESTOR) 40 MG tablet Take 40 mg by mouth every evening.    02/10/2020 at 2000  . valsartan (DIOVAN) 160 MG tablet Take 160 mg by mouth every evening.    02/10/2020 at 2000    Family History  Problem Relation Age of Onset  . Heart attack Father   . Heart disease Sister   . Heart disease Brother   . Heart attack Brother   . Heart attack Brother   . Heart disease Sister      Review of Systems:   Review of Systems  Constitutional: Negative for chills, diaphoresis, fever, malaise/fatigue and weight loss.  HENT: Negative.   Respiratory: Positive for shortness of breath.   Cardiovascular:  Positive for chest pain. Negative for orthopnea and leg swelling.  Gastrointestinal: Negative.   Musculoskeletal: Positive for back pain and joint pain.  Neurological: Negative.   Psychiatric/Behavioral: Negative.       Physical Exam: BP (!) 132/57   Pulse 70   Temp 97.8 F (36.6 C) (Oral)   Resp 18   Ht 5\' 9"  (1.753 m)   Wt 93 kg   SpO2 98%   BMI 30.27 kg/m  Physical Exam  Constitutional: He is oriented to person, place, and time and well-developed, well-nourished, and in no distress. No distress.  HENT:  Head: Normocephalic and atraumatic.  Eyes: EOM are normal. No scleral icterus.  Neck: No tracheal deviation present.  Cardiovascular: Normal rate and regular rhythm.  Pulmonary/Chest: Effort normal. No respiratory distress.  Abdominal: He exhibits no distension.  Musculoskeletal:        General: Normal range of motion.     Cervical back: Normal range of motion.  Neurological: He is alert and oriented to person, place, and time.  Skin: Skin is warm and dry. He is not diaphoretic.      Diagnostic Studies & Laboratory data:    Left Heart Catherization: Good targets for bypass.  Distal RCA, RI, OM1, LAD Echo: Preserved function, no significant valvular disease   I have independently reviewed the above radiologic studies and discussed with the patient   Recent Lab Findings: Lab Results  Component Value Date   WBC 11.2 (H) 02/05/2020   HGB 15.9 02/05/2020   HCT 46.9 02/05/2020   PLT 247 02/05/2020   GLUCOSE 160 (H) 02/05/2020   CHOL 164 01/28/2020   TRIG 113 01/28/2020   HDL 47 01/28/2020   LDLCALC 97 01/28/2020   ALT 22 01/28/2020   AST 21 01/28/2020   NA 142 02/05/2020   K 5.0 02/05/2020   CL 101 02/05/2020   CREATININE 1.02 02/05/2020   BUN 15 02/05/2020   CO2 22 02/05/2020      Assessment / Plan:   65 yo male with 3V CAD, preserved EF, and good targets for surgical bypass.  Will plan for CABG 4 on 2/17.  Risks and benefits of surgery vs PCI vs  medical therapy were discussed in detail.  He is agreeable to proceed with this plan        Lajuana Matte 02/11/2020 11:26 AM

## 2020-02-11 NOTE — Progress Notes (Signed)
TCTS consulted for CABG evaluation. °

## 2020-02-11 NOTE — Progress Notes (Signed)
ANTICOAGULATION CONSULT NOTE - Initial Consult  Pharmacy Consult for heparin Indication: chest pain/ACS  No Known Allergies  Patient Measurements: Height: 5\' 9"  (175.3 cm) Weight: 205 lb (93 kg) IBW/kg (Calculated) : 70.7 Heparin Dosing Weight: 93  Vital Signs: Temp: 98.1 F (36.7 C) (02/16 1248) Temp Source: Oral (02/16 1248) BP: 140/67 (02/16 1225) Pulse Rate: 74 (02/16 1225)  Labs: No results for input(s): HGB, HCT, PLT, APTT, LABPROT, INR, HEPARINUNFRC, HEPRLOWMOCWT, CREATININE, CKTOTAL, CKMB, TROPONINIHS in the last 72 hours.  Estimated Creatinine Clearance: 82.4 mL/min (by C-G formula based on SCr of 1.02 mg/dL).   Medical History: No past medical history on file.   Assessment: 47 yoM found to have multivessel CAD. TCTS consulted for CABG, pharmacy to start heparin in 8hr. No AC prior to admit, sheath removed ~1030.  Goal of Therapy:  Heparin level 0.3-0.7 units/ml Monitor platelets by anticoagulation protocol: Yes   Plan:  -Heparin 1150 units/h no bolus starting at 1830 -Check heparin level 6hr after restart -F/U CABG plans   Arrie Senate, PharmD, BCPS Clinical Pharmacist [phone] Please check AMION for all [place] numbers 02/11/2020

## 2020-02-11 NOTE — Anesthesia Preprocedure Evaluation (Addendum)
Anesthesia Evaluation  Patient identified by MRN, date of birth, ID band Patient awake    Reviewed: Allergy & Precautions, NPO status , Patient's Chart, lab work & pertinent test results, reviewed documented beta blocker date and time   History of Anesthesia Complications Negative for: history of anesthetic complications  Airway Mallampati: II  TM Distance: >3 FB Neck ROM: Full    Dental  (+) Dental Advisory Given   Pulmonary neg pulmonary ROS,    Pulmonary exam normal        Cardiovascular hypertension, Pt. on medications and Pt. on home beta blockers + angina + CAD and + Peripheral Vascular Disease  Normal cardiovascular exam   '21 TTE - EF 60 to 65%. LV septal wall thickness was mildly increased. Mildly increased left ventricular  posterior wall thickness. No LVH. Trivial MR. Mild AI.   '21 Cath -  Ramus lesion is 50% stenosed.  Dist LM to Ost LAD lesion is 30% stenosed.  1st Mrg lesion is 60% stenosed.  Prox LAD lesion is 90% stenosed.  Prox LAD to Mid LAD lesion is 60% stenosed.  Mid LAD lesion is 70% stenosed.  Prox RCA lesion is 99% stenosed.  Ost RCA to Prox RCA lesion is 90% stenosed.  Mid RCA lesion is 50% stenosed.  Dist RCA lesion is 20% stenosed.  '21 Carotid US - 80-99% left ICAS, 40-59% right ICAS    Neuro/Psych negative neurological ROS  negative psych ROS   GI/Hepatic negative GI ROS, Neg liver ROS,   Endo/Other   Obesity   Renal/GU negative Renal ROS     Musculoskeletal negative musculoskeletal ROS (+)   Abdominal   Peds  Hematology negative hematology ROS (+)   Anesthesia Other Findings Covid neg 02/08/20   Reproductive/Obstetrics                            Anesthesia Physical Anesthesia Plan  ASA: IV  Anesthesia Plan: General   Post-op Pain Management:    Induction: Intravenous  PONV Risk Score and Plan: 2 and Treatment may vary due to age  or medical condition  Airway Management Planned: Oral ETT  Additional Equipment: Arterial line, CVP, PA Cath, TEE and Ultrasound Guidance Line Placement  Intra-op Plan:   Post-operative Plan: Post-operative intubation/ventilation  Informed Consent: I have reviewed the patients History and Physical, chart, labs and discussed the procedure including the risks, benefits and alternatives for the proposed anesthesia with the patient or authorized representative who has indicated his/her understanding and acceptance.     Dental advisory given  Plan Discussed with: CRNA and Anesthesiologist  Anesthesia Plan Comments:        Anesthesia Quick Evaluation

## 2020-02-11 NOTE — Progress Notes (Signed)
Pre CABG vascular testing complete.  Please see CV proc tab for preliminary results.  Critical findings called to Dr. Claiborne Billings @ end of exam, 17:30, office closed.  Waiting on callback from on call provider. Lita Mains- RDMS, RVT 5:47 PM  02/11/2020

## 2020-02-11 NOTE — Consult Note (Signed)
ASSESSMENT & PLAN   GREATER THAN 80% SYMPTOMATIC LEFT CAROTID STENOSIS: This patient is scheduled for coronary revascularization tomorrow.  He has a very tight left carotid stenosis with a moderate (40 to 59%) right carotid stenosis.  He essentially has a string sign on the left.  He did describe one episode 6 months ago in which his right arm suddenly became weak and numb.  This lasted only briefly.  Thus I think this is potentially a symptomatic left carotid stenosis.  I have recommended left carotid endarterectomy combined with CABG in order to lower his risk of perioperative and future stroke.  We have discussed the indications for the procedure and the potential complications including a 1 to 2% risk of perioperative stroke.  I have discussed this with Dr. Donnetta Hutching who will be doing the surgery and also with Dr. Kipp Brood.  We are all in agreement with this plan.  I discussed the surgery with the patient and his wife tonight.  REASON FOR CONSULT:    Bilateral carotid disease.  The consult is requested by Dr. Kipp Brood.  HPI:   Jesus Francis is a 64 y.o. male who was being worked up for exertional angina.  He underwent cardiac catheterization today and was found to have multivessel coronary artery disease.  CABG was recommended.  His preoperative work-up included a carotid duplex scan which showed a very tight left carotid stenosis and for this reason vascular surgery was consulted.  The patient is right-handed although he writes with his left hand.  He denies any previous history of stroke.  He does describe an episode 6 months ago in which suddenly his right arm became weak and numb and he dropped a cup.  The symptoms lasted only briefly and has had no further symptoms.  He did have an episode when he passed out in November but had no focal weakness or paresthesias associated with this.  He denies any history of expressive or receptive aphasia.  He denies any history of amaurosis  fugax.  His risk factors for peripheral vascular disease include hypertension, hypercholesterolemia, and a strong family history of premature cardiovascular disease.  He denies any history of diabetes and denies any tobacco use.  He had a left upper extremity DVT when he was in his 60s.  He denies any history of claudication, rest pain, or nonhealing ulcers.  He is on aspirin and is on a statin.  No past medical history on file.  Family History  Problem Relation Age of Onset  . Heart attack Father   . Heart disease Sister   . Heart disease Brother   . Heart attack Brother   . Heart attack Brother   . Heart disease Sister     SOCIAL HISTORY: Social History   Tobacco Use  . Smoking status: Never Smoker  . Smokeless tobacco: Never Used  Substance Use Topics  . Alcohol use: Yes    Alcohol/week: 3.0 standard drinks    Types: 1 Glasses of wine, 1 Shots of liquor, 1 Cans of beer per week    No Known Allergies  Current Facility-Administered Medications  Medication Dose Route Frequency Provider Last Rate Last Admin  . 0.9 %  sodium chloride infusion   Intravenous Continuous Troy Sine, MD   Stopped at 02/11/20 1751  . 0.9 %  sodium chloride infusion  250 mL Intravenous PRN Troy Sine, MD      . acetaminophen (TYLENOL) tablet 650 mg  650 mg Oral Q4H PRN  Troy Sine, MD      . Derrill Memo ON 02/12/2020] aspirin chewable tablet 81 mg  81 mg Oral Daily Troy Sine, MD      . Derrill Memo ON 02/12/2020] cefUROXime (ZINACEF) 1.5 g in sodium chloride 0.9 % 100 mL IVPB  1.5 g Intravenous To OR Lajuana Matte, MD      . Derrill Memo ON 02/12/2020] cefUROXime (ZINACEF) 750 mg in sodium chloride 0.9 % 100 mL IVPB  750 mg Intravenous To OR Lightfoot, Lucile Crater, MD      . Derrill Memo ON 02/12/2020] chlorhexidine (PERIDEX) 0.12 % solution 15 mL  15 mL Mouth/Throat Once Lightfoot, Lucile Crater, MD      . Chlorhexidine Gluconate Cloth 2 % PADS 6 each  6 each Topical Once Lajuana Matte, MD        And  . Chlorhexidine Gluconate Cloth 2 % PADS 6 each  6 each Topical Once Lajuana Matte, MD      . Derrill Memo ON 02/12/2020] dexmedetomidine (PRECEDEX) 400 MCG/100ML (4 mcg/mL) infusion  0.1-0.7 mcg/kg/hr Intravenous To OR Lightfoot, Harrell O, MD      . diazepam (VALIUM) tablet 5 mg  5 mg Oral Q6H PRN Troy Sine, MD      . Derrill Memo ON 02/12/2020] EPINEPHrine (ADRENALIN) 4 mg in NS 250 mL (0.016 mg/mL) premix infusion  0-10 mcg/min Intravenous To OR Lightfoot, Harrell O, MD      . ezetimibe (ZETIA) tablet 10 mg  10 mg Oral Daily Troy Sine, MD   10 mg at 02/11/20 1346  . [START ON 02/12/2020] heparin 2,500 Units, papaverine 30 mg in electrolyte-148 (PLASMALYTE-148) 500 mL irrigation   Irrigation To OR Lightfoot, Lucile Crater, MD      . Derrill Memo ON 02/12/2020] heparin 30,000 units/NS 1000 mL solution for CELLSAVER   Other To OR Lightfoot, Harrell O, MD      . heparin ADULT infusion 100 units/mL (25000 units/263mL sodium chloride 0.45%)  1,150 Units/hr Intravenous Continuous Einar Grad, RPH 11.5 mL/hr at 02/11/20 1814 1,150 Units/hr at 02/11/20 1814  . [START ON 02/12/2020] insulin regular, human (MYXREDLIN) 100 units/ 100 mL infusion   Intravenous To OR Lightfoot, Harrell O, MD      . isosorbide mononitrate (IMDUR) 24 hr tablet 30 mg  30 mg Oral Daily Troy Sine, MD      . Derrill Memo ON 02/12/2020] [facility] Blood Cardioplegia vial (lidocaine/magnesium/mannitol 0.26g-4g-6.4g)   Intracoronary Once Lajuana Matte, MD      . metoprolol succinate (TOPROL-XL) 24 hr tablet 25 mg  25 mg Oral BID Troy Sine, MD      . Derrill Memo ON 02/12/2020] metoprolol tartrate (LOPRESSOR) tablet 12.5 mg  12.5 mg Oral Once Lajuana Matte, MD      . Derrill Memo ON 02/12/2020] milrinone (PRIMACOR) 20 MG/100 ML (0.2 mg/mL) infusion  0.3 mcg/kg/min Intravenous To OR Lajuana Matte, MD      . Derrill Memo ON 02/12/2020] nitroGLYCERIN 50 mg in dextrose 5 % 250 mL (0.2 mg/mL) infusion  2-200 mcg/min Intravenous To  OR Lajuana Matte, MD      . Derrill Memo ON 02/12/2020] norepinephrine (LEVOPHED) 4mg  in 276mL premix infusion  0-40 mcg/min Intravenous To OR Lightfoot, Lucile Crater, MD      . ondansetron (ZOFRAN) injection 4 mg  4 mg Intravenous Q6H PRN Troy Sine, MD      . Derrill Memo ON 02/12/2020] phenylephrine (NEOSYNEPHRINE) 20-0.9 MG/250ML-% infusion  30-200 mcg/min Intravenous To OR Melodie Bouillon  O, MD      . Derrill Memo ON 02/12/2020] potassium chloride injection 80 mEq  80 mEq Other To OR Lightfoot, Harrell O, MD      . rosuvastatin (CRESTOR) tablet 40 mg  40 mg Oral QPM Troy Sine, MD   40 mg at 02/11/20 1811  . sodium chloride flush (NS) 0.9 % injection 3 mL  3 mL Intravenous Q12H Shelva Majestic A, MD      . sodium chloride flush (NS) 0.9 % injection 3 mL  3 mL Intravenous PRN Troy Sine, MD      . temazepam (RESTORIL) capsule 15 mg  15 mg Oral Once PRN Lajuana Matte, MD      . Derrill Memo ON 02/12/2020] tranexamic acid (CYKLOKAPRON) 2,500 mg in sodium chloride 0.9 % 250 mL (10 mg/mL) infusion  1.5 mg/kg/hr Intravenous To OR Lajuana Matte, MD      . Derrill Memo ON 02/12/2020] tranexamic acid (CYKLOKAPRON) bolus via infusion - over 30 minutes 1,395 mg  15 mg/kg Intravenous To OR Lajuana Matte, MD      . Derrill Memo ON 02/12/2020] tranexamic acid (CYKLOKAPRON) pump prime solution 186 mg  2 mg/kg Intracatheter To OR Lightfoot, Lucile Crater, MD      . Derrill Memo ON 02/12/2020] vancomycin (VANCOREADY) IVPB 1500 mg/300 mL  1,500 mg Intravenous To OR Lightfoot, Lucile Crater, MD        REVIEW OF SYSTEMS:  [X]  denotes positive finding, [ ]  denotes negative finding Cardiac  Comments:  Chest pain or chest pressure: x   Shortness of breath upon exertion: x   Short of breath when lying flat:    Irregular heart rhythm:        Vascular    Pain in calf, thigh, or hip brought on by ambulation:    Pain in feet at night that wakes you up from your sleep:     Blood clot in your veins:    Leg swelling:          Pulmonary    Oxygen at home:    Productive cough:     Wheezing:         Neurologic    Sudden weakness in arms or legs:     Sudden numbness in arms or legs:     Sudden onset of difficulty speaking or slurred speech:    Temporary loss of vision in one eye:     Problems with dizziness:         Gastrointestinal    Blood in stool:     Vomited blood:         Genitourinary    Burning when urinating:     Blood in urine:        Psychiatric    Major depression:         Hematologic    Bleeding problems:    Problems with blood clotting too easily:        Skin    Rashes or ulcers:        Constitutional    Fever or chills:    -  PHYSICAL EXAM:   Vitals:   02/11/20 1335 02/11/20 1410 02/11/20 1435 02/11/20 1505  BP: (!) 152/97 (!) 149/111 (!) 156/90 132/80  Pulse: 72 71 72 67  Resp:      Temp:  97.6 F (36.4 C)    TempSrc:  Oral    SpO2: 99% 99% 98% 98%  Weight:      Height:  Body mass index is 30.27 kg/m. GENERAL: The patient is a well-nourished male, in no acute distress. The vital signs are documented above. CARDIAC: There is a regular rate and rhythm.  VASCULAR: I do not detect carotid bruits. He has palpable radial, femoral, popliteal, dorsalis pedis, and posterior tibial pulses bilaterally. He has no significant lower extremity swelling. PULMONARY: There is good air exchange bilaterally without wheezing or rales. ABDOMEN: Soft and non-tender with normal pitched bowel sounds.  I do not palpate an abdominal aortic aneurysm. MUSCULOSKELETAL: There are no major deformities. NEUROLOGIC: No focal weakness or paresthesias are detected. SKIN: There are no ulcers or rashes noted. PSYCHIATRIC: The patient has a normal affect.  DATA:    Lab Results  Component Value Date   WBC 11.2 (H) 02/05/2020   HGB 15.9 02/05/2020   HCT 46.9 02/05/2020   MCV 91 02/05/2020   PLT 247 02/05/2020   Lab Results  Component Value Date   NA 142 02/05/2020   K 5.0 02/05/2020    CL 101 02/05/2020   CO2 22 02/05/2020   Lab Results  Component Value Date   CREATININE 1.02 02/05/2020   CAROTID DUPLEX: I have independently interpreted his carotid duplex scan.  On the left side, he has a greater than 80% stenosis.  Peak systolic velocities Q000111Q cm/s with an end-diastolic velocity Q000111Q cm/s.  He is noted to have a "string sign."  Left vertebral artery is patent with antegrade flow.  On the right side he has a 40 to 59% stenosis.  The right vertebral artery is patent with antegrade flow.  LOWER EXTREMITY ARTERIAL DOPPLER STUDY: I have independently interpreted his lower extremity arterial Doppler study.  He has triphasic Doppler signals in the right foot with an ABI of 100%.  He has triphasic Doppler signals in the left foot with an ABI of 97%.  Deitra Mayo [facility]: [phone] Office: [phone]

## 2020-02-11 NOTE — Progress Notes (Signed)
[id number] Discussed with pt and significant other importance of IS and walking after surgery. Left IS in room for RN to review with pt as he is eating. Gave staying in the tube handout and discussed sternal precautions. Pt has significant other available to assist with care after discharge. Wrote down how to view pre op video and gave OHS booklet. Will follow up after surgery. Graylon Good RN BSN 02/11/2020 2:29 PM

## 2020-02-11 NOTE — Progress Notes (Addendum)
Carotid U/S tech stated pt had >90% blockage to left ICA. Roddenberry PA with CVTS aware and Angie Duke PA with cards made aware as well. Cont to monitor. Carroll Kinds RN

## 2020-02-12 ENCOUNTER — Inpatient Hospital Stay (HOSPITAL_COMMUNITY)
Admission: AD | Disposition: A | Discharge status: Home / Self Care | Payer: Self-pay | Source: Home / Self Care | Attending: Thoracic Surgery (Cardiothoracic Vascular Surgery)

## 2020-02-12 ENCOUNTER — Inpatient Hospital Stay (HOSPITAL_COMMUNITY): Payer: 59 | Admitting: Anesthesiology

## 2020-02-12 ENCOUNTER — Inpatient Hospital Stay (HOSPITAL_COMMUNITY): Payer: 59

## 2020-02-12 DIAGNOSIS — I25110 Atherosclerotic heart disease of native coronary artery with unstable angina pectoris: Secondary | ICD-10-CM

## 2020-02-12 DIAGNOSIS — Z9889 Other specified postprocedural states: Secondary | ICD-10-CM

## 2020-02-12 DIAGNOSIS — Z951 Presence of aortocoronary bypass graft: Secondary | ICD-10-CM

## 2020-02-12 HISTORY — PX: TEE WITHOUT CARDIOVERSION: SHX5443

## 2020-02-12 HISTORY — PX: CORONARY ARTERY BYPASS GRAFT: SHX141

## 2020-02-12 HISTORY — PX: ENDARTERECTOMY: SHX5162

## 2020-02-12 LAB — POCT I-STAT 7, (LYTES, BLD GAS, ICA,H+H)
Acid-Base Excess: 3 mmol/L — ABNORMAL HIGH (ref 0.0–2.0)
Acid-Base Excess: 1 mmol/L (ref 0.0–2.0)
Acid-base deficit: 5 mmol/L — ABNORMAL HIGH (ref 0.0–2.0)
Acid-base deficit: 5 mmol/L — ABNORMAL HIGH (ref 0.0–2.0)
Acid-base deficit: 6 mmol/L — ABNORMAL HIGH (ref 0.0–2.0)
Acid-base deficit: 2 mmol/L (ref 0.0–2.0)
Bicarbonate: 27.9 mmol/L (ref 20.0–28.0)
Bicarbonate: 26.1 mmol/L (ref 20.0–28.0)
Bicarbonate: 20.8 mmol/L (ref 20.0–28.0)
Bicarbonate: 19.4 mmol/L — ABNORMAL LOW (ref 20.0–28.0)
Bicarbonate: 21.2 mmol/L (ref 20.0–28.0)
Bicarbonate: 24.3 mmol/L (ref 20.0–28.0)
Bicarbonate: 26.4 mmol/L (ref 20.0–28.0)
Calcium, Ion: 0.94 mmol/L — ABNORMAL LOW (ref 1.15–1.40)
Calcium, Ion: 1.33 mmol/L (ref 1.15–1.40)
Calcium, Ion: 1.19 mmol/L (ref 1.15–1.40)
Calcium, Ion: 1.12 mmol/L — ABNORMAL LOW (ref 1.15–1.40)
Calcium, Ion: 1.16 mmol/L (ref 1.15–1.40)
Calcium, Ion: 1.22 mmol/L (ref 1.15–1.40)
Calcium, Ion: 1.23 mmol/L (ref 1.15–1.40)
HCT: 26 % — ABNORMAL LOW (ref 39.0–52.0)
HCT: 28 % — ABNORMAL LOW (ref 39.0–52.0)
HCT: 26 % — ABNORMAL LOW (ref 39.0–52.0)
HCT: 26 % — ABNORMAL LOW (ref 39.0–52.0)
HCT: 27 % — ABNORMAL LOW (ref 39.0–52.0)
HCT: 28 % — ABNORMAL LOW (ref 39.0–52.0)
HCT: 30 % — ABNORMAL LOW (ref 39.0–52.0)
Hemoglobin: 8.8 g/dL — ABNORMAL LOW (ref 13.0–17.0)
Hemoglobin: 9.5 g/dL — ABNORMAL LOW (ref 13.0–17.0)
Hemoglobin: 8.8 g/dL — ABNORMAL LOW (ref 13.0–17.0)
Hemoglobin: 8.8 g/dL — ABNORMAL LOW (ref 13.0–17.0)
Hemoglobin: 9.2 g/dL — ABNORMAL LOW (ref 13.0–17.0)
Hemoglobin: 9.5 g/dL — ABNORMAL LOW (ref 13.0–17.0)
Hemoglobin: 10.2 g/dL — ABNORMAL LOW (ref 13.0–17.0)
O2 Saturation: 100 %
O2 Saturation: 100 %
O2 Saturation: 97 %
O2 Saturation: 99 %
O2 Saturation: 94 %
O2 Saturation: 97 %
O2 Saturation: 100 %
Patient temperature: 35.6
Patient temperature: 36.4
Patient temperature: 36.7
Patient temperature: 37.1
Patient temperature: 37.2
Potassium: 4.5 mmol/L (ref 3.5–5.1)
Potassium: 4.8 mmol/L (ref 3.5–5.1)
Potassium: 4.1 mmol/L (ref 3.5–5.1)
Potassium: 4.1 mmol/L (ref 3.5–5.1)
Potassium: 3.9 mmol/L (ref 3.5–5.1)
Potassium: 4.8 mmol/L (ref 3.5–5.1)
Potassium: 4.7 mmol/L (ref 3.5–5.1)
Sodium: 142 mmol/L (ref 135–145)
Sodium: 141 mmol/L (ref 135–145)
Sodium: 144 mmol/L (ref 135–145)
Sodium: 144 mmol/L (ref 135–145)
Sodium: 144 mmol/L (ref 135–145)
Sodium: 142 mmol/L (ref 135–145)
Sodium: 140 mmol/L (ref 135–145)
TCO2: 29 mmol/L (ref 22–32)
TCO2: 27 mmol/L (ref 22–32)
TCO2: 22 mmol/L (ref 22–32)
TCO2: 20 mmol/L — ABNORMAL LOW (ref 22–32)
TCO2: 23 mmol/L (ref 22–32)
TCO2: 26 mmol/L (ref 22–32)
TCO2: 28 mmol/L (ref 22–32)
pCO2 arterial: 44.7 mmHg (ref 32.0–48.0)
pCO2 arterial: 43.6 mmHg (ref 32.0–48.0)
pCO2 arterial: 38.7 mmHg (ref 32.0–48.0)
pCO2 arterial: 33.4 mmHg (ref 32.0–48.0)
pCO2 arterial: 47.6 mmHg (ref 32.0–48.0)
pCO2 arterial: 49.6 mmHg — ABNORMAL HIGH (ref 32.0–48.0)
pCO2 arterial: 48 mmHg (ref 32.0–48.0)
pH, Arterial: 7.404 (ref 7.350–7.450)
pH, Arterial: 7.385 (ref 7.350–7.450)
pH, Arterial: 7.332 — ABNORMAL LOW (ref 7.350–7.450)
pH, Arterial: 7.369 (ref 7.350–7.450)
pH, Arterial: 7.256 — ABNORMAL LOW (ref 7.350–7.450)
pH, Arterial: 7.299 — ABNORMAL LOW (ref 7.350–7.450)
pH, Arterial: 7.349 — ABNORMAL LOW (ref 7.350–7.450)
pO2, Arterial: 396 mmHg — ABNORMAL HIGH (ref 83.0–108.0)
pO2, Arterial: 475 mmHg — ABNORMAL HIGH (ref 83.0–108.0)
pO2, Arterial: 88 mmHg (ref 83.0–108.0)
pO2, Arterial: 142 mmHg — ABNORMAL HIGH (ref 83.0–108.0)
pO2, Arterial: 80 mmHg — ABNORMAL LOW (ref 83.0–108.0)
pO2, Arterial: 106 mmHg (ref 83.0–108.0)
pO2, Arterial: 241 mmHg — ABNORMAL HIGH (ref 83.0–108.0)

## 2020-02-12 LAB — CBC
HCT: 41.1 % (ref 39.0–52.0)
HCT: 30.1 % — ABNORMAL LOW (ref 39.0–52.0)
HCT: 31.2 % — ABNORMAL LOW (ref 39.0–52.0)
Hemoglobin: 13.9 g/dL (ref 13.0–17.0)
Hemoglobin: 10 g/dL — ABNORMAL LOW (ref 13.0–17.0)
Hemoglobin: 10.5 g/dL — ABNORMAL LOW (ref 13.0–17.0)
MCH: 31.2 pg (ref 26.0–34.0)
MCH: 31.2 pg (ref 26.0–34.0)
MCH: 31.4 pg (ref 26.0–34.0)
MCHC: 33.8 g/dL (ref 30.0–36.0)
MCHC: 33.2 g/dL (ref 30.0–36.0)
MCHC: 33.7 g/dL (ref 30.0–36.0)
MCV: 92.2 fL (ref 80.0–100.0)
MCV: 93.8 fL (ref 80.0–100.0)
MCV: 93.4 fL (ref 80.0–100.0)
Platelets: 201 10*3/uL (ref 150–400)
Platelets: 138 10*3/uL — ABNORMAL LOW (ref 150–400)
Platelets: 121 10*3/uL — ABNORMAL LOW (ref 150–400)
RBC: 4.46 MIL/uL (ref 4.22–5.81)
RBC: 3.21 MIL/uL — ABNORMAL LOW (ref 4.22–5.81)
RBC: 3.34 MIL/uL — ABNORMAL LOW (ref 4.22–5.81)
RDW: 12 % (ref 11.5–15.5)
RDW: 12 % (ref 11.5–15.5)
RDW: 12.1 % (ref 11.5–15.5)
WBC: 10.6 10*3/uL — ABNORMAL HIGH (ref 4.0–10.5)
WBC: 15.2 10*3/uL — ABNORMAL HIGH (ref 4.0–10.5)
WBC: 14.4 10*3/uL — ABNORMAL HIGH (ref 4.0–10.5)
nRBC: 0 % (ref 0.0–0.2)
nRBC: 0 % (ref 0.0–0.2)
nRBC: 0 % (ref 0.0–0.2)

## 2020-02-12 LAB — BASIC METABOLIC PANEL
Anion gap: 11 (ref 5–15)
Anion gap: 10 (ref 5–15)
BUN: 14 mg/dL (ref 8–23)
BUN: 9 mg/dL (ref 8–23)
CO2: 24 mmol/L (ref 22–32)
CO2: 22 mmol/L (ref 22–32)
Calcium: 8.6 mg/dL — ABNORMAL LOW (ref 8.9–10.3)
Calcium: 8.5 mg/dL — ABNORMAL LOW (ref 8.9–10.3)
Chloride: 104 mmol/L (ref 98–111)
Chloride: 107 mmol/L (ref 98–111)
Creatinine, Ser: 1.01 mg/dL (ref 0.61–1.24)
Creatinine, Ser: 0.98 mg/dL (ref 0.61–1.24)
GFR calc Af Amer: >60 mL/min (ref >60)
GFR calc Af Amer: >60 mL/min (ref >60)
GFR calc non Af Amer: >60 mL/min (ref >60)
GFR calc non Af Amer: >60 mL/min (ref >60)
Glucose, Bld: 127 mg/dL — ABNORMAL HIGH (ref 70–99)
Glucose, Bld: 181 mg/dL — ABNORMAL HIGH (ref 70–99)
Potassium: 4.1 mmol/L (ref 3.5–5.1)
Potassium: 4.7 mmol/L (ref 3.5–5.1)
Sodium: 139 mmol/L (ref 135–145)
Sodium: 139 mmol/L (ref 135–145)

## 2020-02-12 LAB — GLUCOSE, CAPILLARY
Glucose-Capillary: 103 mg/dL — ABNORMAL HIGH (ref 70–99)
Glucose-Capillary: 99 mg/dL (ref 70–99)
Glucose-Capillary: 109 mg/dL — ABNORMAL HIGH (ref 70–99)
Glucose-Capillary: 114 mg/dL — ABNORMAL HIGH (ref 70–99)
Glucose-Capillary: 124 mg/dL — ABNORMAL HIGH (ref 70–99)
Glucose-Capillary: 139 mg/dL — ABNORMAL HIGH (ref 70–99)
Glucose-Capillary: 169 mg/dL — ABNORMAL HIGH (ref 70–99)
Glucose-Capillary: 169 mg/dL — ABNORMAL HIGH (ref 70–99)

## 2020-02-12 LAB — PROTIME-INR
INR: 1 (ref 0.8–1.2)
INR: 1.4 — ABNORMAL HIGH (ref 0.8–1.2)
Prothrombin Time: 13.3 seconds (ref 11.4–15.2)
Prothrombin Time: 17.2 seconds — ABNORMAL HIGH (ref 11.4–15.2)

## 2020-02-12 LAB — POCT I-STAT, CHEM 8
BUN: 11 mg/dL (ref 8–23)
BUN: 9 mg/dL (ref 8–23)
BUN: 8 mg/dL (ref 8–23)
BUN: 9 mg/dL (ref 8–23)
BUN: 8 mg/dL (ref 8–23)
BUN: 9 mg/dL (ref 8–23)
Calcium, Ion: 1.24 mmol/L (ref 1.15–1.40)
Calcium, Ion: 1.18 mmol/L (ref 1.15–1.40)
Calcium, Ion: 0.94 mmol/L — ABNORMAL LOW (ref 1.15–1.40)
Calcium, Ion: 1.07 mmol/L — ABNORMAL LOW (ref 1.15–1.40)
Calcium, Ion: 1.08 mmol/L — ABNORMAL LOW (ref 1.15–1.40)
Calcium, Ion: 1.35 mmol/L (ref 1.15–1.40)
Chloride: 105 mmol/L (ref 98–111)
Chloride: 107 mmol/L (ref 98–111)
Chloride: 102 mmol/L (ref 98–111)
Chloride: 105 mmol/L (ref 98–111)
Chloride: 105 mmol/L (ref 98–111)
Chloride: 106 mmol/L (ref 98–111)
Creatinine, Ser: 0.8 mg/dL (ref 0.61–1.24)
Creatinine, Ser: 0.7 mg/dL (ref 0.61–1.24)
Creatinine, Ser: 0.6 mg/dL — ABNORMAL LOW (ref 0.61–1.24)
Creatinine, Ser: 0.7 mg/dL (ref 0.61–1.24)
Creatinine, Ser: 0.6 mg/dL — ABNORMAL LOW (ref 0.61–1.24)
Creatinine, Ser: 0.7 mg/dL (ref 0.61–1.24)
Glucose, Bld: 135 mg/dL — ABNORMAL HIGH (ref 70–99)
Glucose, Bld: 141 mg/dL — ABNORMAL HIGH (ref 70–99)
Glucose, Bld: 113 mg/dL — ABNORMAL HIGH (ref 70–99)
Glucose, Bld: 135 mg/dL — ABNORMAL HIGH (ref 70–99)
Glucose, Bld: 120 mg/dL — ABNORMAL HIGH (ref 70–99)
Glucose, Bld: 136 mg/dL — ABNORMAL HIGH (ref 70–99)
HCT: 39 % (ref 39.0–52.0)
HCT: 34 % — ABNORMAL LOW (ref 39.0–52.0)
HCT: 26 % — ABNORMAL LOW (ref 39.0–52.0)
HCT: 29 % — ABNORMAL LOW (ref 39.0–52.0)
HCT: 29 % — ABNORMAL LOW (ref 39.0–52.0)
HCT: 29 % — ABNORMAL LOW (ref 39.0–52.0)
Hemoglobin: 13.3 g/dL (ref 13.0–17.0)
Hemoglobin: 11.6 g/dL — ABNORMAL LOW (ref 13.0–17.0)
Hemoglobin: 8.8 g/dL — ABNORMAL LOW (ref 13.0–17.0)
Hemoglobin: 9.9 g/dL — ABNORMAL LOW (ref 13.0–17.0)
Hemoglobin: 9.9 g/dL — ABNORMAL LOW (ref 13.0–17.0)
Hemoglobin: 9.9 g/dL — ABNORMAL LOW (ref 13.0–17.0)
Potassium: 4.1 mmol/L (ref 3.5–5.1)
Potassium: 4.2 mmol/L (ref 3.5–5.1)
Potassium: 4.5 mmol/L (ref 3.5–5.1)
Potassium: 5.5 mmol/L — ABNORMAL HIGH (ref 3.5–5.1)
Potassium: 4.8 mmol/L (ref 3.5–5.1)
Potassium: 5.3 mmol/L — ABNORMAL HIGH (ref 3.5–5.1)
Sodium: 140 mmol/L (ref 135–145)
Sodium: 140 mmol/L (ref 135–145)
Sodium: 138 mmol/L (ref 135–145)
Sodium: 142 mmol/L (ref 135–145)
Sodium: 139 mmol/L (ref 135–145)
Sodium: 140 mmol/L (ref 135–145)
TCO2: 29 mmol/L (ref 22–32)
TCO2: 25 mmol/L (ref 22–32)
TCO2: 27 mmol/L (ref 22–32)
TCO2: 28 mmol/L (ref 22–32)
TCO2: 26 mmol/L (ref 22–32)
TCO2: 28 mmol/L (ref 22–32)

## 2020-02-12 LAB — URINALYSIS, ROUTINE W REFLEX MICROSCOPIC
Bacteria, UA: NONE SEEN
Bilirubin Urine: NEGATIVE
Glucose, UA: NEGATIVE mg/dL
Ketones, ur: NEGATIVE mg/dL
Leukocytes,Ua: NEGATIVE
Nitrite: NEGATIVE
Protein, ur: NEGATIVE mg/dL
Specific Gravity, Urine: 1.012 (ref 1.005–1.030)
pH: 5 (ref 5.0–8.0)

## 2020-02-12 LAB — COMPREHENSIVE METABOLIC PANEL
ALT: 26 U/L (ref 0–44)
AST: 22 U/L (ref 15–41)
Albumin: 3.3 g/dL — ABNORMAL LOW (ref 3.5–5.0)
Alkaline Phosphatase: 43 U/L (ref 38–126)
Anion gap: 8 (ref 5–15)
BUN: 13 mg/dL (ref 8–23)
CO2: 23 mmol/L (ref 22–32)
Calcium: 8.6 mg/dL — ABNORMAL LOW (ref 8.9–10.3)
Chloride: 106 mmol/L (ref 98–111)
Creatinine, Ser: 1.05 mg/dL (ref 0.61–1.24)
GFR calc Af Amer: >60 mL/min (ref >60)
GFR calc non Af Amer: >60 mL/min (ref >60)
Glucose, Bld: 155 mg/dL — ABNORMAL HIGH (ref 70–99)
Potassium: 4 mmol/L (ref 3.5–5.1)
Sodium: 137 mmol/L (ref 135–145)
Total Bilirubin: 0.8 mg/dL (ref 0.3–1.2)
Total Protein: 6.6 g/dL (ref 6.5–8.1)

## 2020-02-12 LAB — SURGICAL PCR SCREEN
MRSA, PCR: NEGATIVE
Staphylococcus aureus: NEGATIVE

## 2020-02-12 LAB — ECHO INTRAOPERATIVE TEE
Height: 69 in
Weight: 3297.6 oz

## 2020-02-12 LAB — APTT
aPTT: 47 seconds — ABNORMAL HIGH (ref 24–36)
aPTT: 33 seconds (ref 24–36)

## 2020-02-12 LAB — HEMOGLOBIN AND HEMATOCRIT, BLOOD
HCT: 27.3 % — ABNORMAL LOW (ref 39.0–52.0)
Hemoglobin: 9.4 g/dL — ABNORMAL LOW (ref 13.0–17.0)

## 2020-02-12 LAB — MAGNESIUM: Magnesium: 2.7 mg/dL — ABNORMAL HIGH (ref 1.7–2.4)

## 2020-02-12 LAB — PLATELET COUNT: Platelets: 139 10*3/uL — ABNORMAL LOW (ref 150–400)

## 2020-02-12 SURGERY — CORONARY ARTERY BYPASS GRAFTING (CABG)
Anesthesia: General | Site: Chest

## 2020-02-12 MED ORDER — LIDOCAINE HCL (PF) 1 % IJ SOLN
INTRAMUSCULAR | Status: AC
  Filled 2020-02-12: qty 30

## 2020-02-12 MED ORDER — SODIUM CHLORIDE (PF) 0.9 % IJ SOLN
OROMUCOSAL | Status: DC | PRN
  Administered 2020-02-12 (×3): 4 mL via TOPICAL

## 2020-02-12 MED ORDER — MAGNESIUM SULFATE 4 GM/100ML IV SOLN
4.0000 g | Freq: Once | INTRAVENOUS | Status: AC
  Administered 2020-02-12: 17:00:00 4 g via INTRAVENOUS
  Filled 2020-02-12: qty 100

## 2020-02-12 MED ORDER — SODIUM CHLORIDE 0.9% FLUSH: 3.0000 mL | INTRAVENOUS | Status: DC | PRN

## 2020-02-12 MED ORDER — MORPHINE SULFATE (PF) 2 MG/ML IV SOLN
1.0000 mg | INTRAVENOUS | Status: DC | PRN
  Administered 2020-02-12: 4 mg via INTRAVENOUS
  Administered 2020-02-12 – 2020-02-13 (×3): 2 mg via INTRAVENOUS
  Administered 2020-02-13: 4 mg via INTRAVENOUS
  Filled 2020-02-12: qty 1
  Filled 2020-02-12: qty 2
  Filled 2020-02-12 (×2): qty 1
  Filled 2020-02-12: qty 2

## 2020-02-12 MED ORDER — NITROGLYCERIN 0.2 MG/ML ON CALL CATH LAB
INTRAVENOUS | Status: DC | PRN
  Administered 2020-02-12: 40 ug via INTRAVENOUS

## 2020-02-12 MED ORDER — DOCUSATE SODIUM 100 MG PO CAPS
200.0000 mg | ORAL_CAPSULE | Freq: Every day | ORAL | Status: DC
  Administered 2020-02-13 – 2020-02-15 (×3): 200 mg via ORAL
  Filled 2020-02-12 (×4): qty 2

## 2020-02-12 MED ORDER — METOPROLOL TARTRATE 5 MG/5ML IV SOLN: 2.5000 mg | INTRAVENOUS | Status: DC | PRN

## 2020-02-12 MED ORDER — FAMOTIDINE IN NACL 20-0.9 MG/50ML-% IV SOLN
INTRAVENOUS | Status: AC
  Filled 2020-02-12: qty 50

## 2020-02-12 MED ORDER — LEVALBUTEROL HCL 1.25 MG/0.5ML IN NEBU
1.2500 mg | INHALATION_SOLUTION | Freq: Four times a day (QID) | RESPIRATORY_TRACT | Status: DC
  Administered 2020-02-12 – 2020-02-13 (×5): 1.25 mg via RESPIRATORY_TRACT
  Filled 2020-02-12 (×5): qty 0.5

## 2020-02-12 MED ORDER — ACETAMINOPHEN 650 MG RE SUPP
RECTAL | Status: AC
  Filled 2020-02-12: qty 1

## 2020-02-12 MED ORDER — PROPOFOL 10 MG/ML IV BOLUS
INTRAVENOUS | Status: DC | PRN
  Administered 2020-02-12: 40 mg via INTRAVENOUS
  Administered 2020-02-12: 30 mg via INTRAVENOUS

## 2020-02-12 MED ORDER — CHLORHEXIDINE GLUCONATE 0.12 % MT SOLN
15.0000 mL | OROMUCOSAL | Status: AC
  Administered 2020-02-12: 15 mL via OROMUCOSAL

## 2020-02-12 MED ORDER — ASPIRIN EC 325 MG PO TBEC
325.0000 mg | DELAYED_RELEASE_TABLET | Freq: Every day | ORAL | Status: DC
  Administered 2020-02-13 – 2020-02-17 (×5): 325 mg via ORAL
  Filled 2020-02-12 (×5): qty 1

## 2020-02-12 MED ORDER — PLASMA-LYTE 148 IV SOLN
INTRAVENOUS | Status: DC | PRN
  Administered 2020-02-12: 11:00:00 500 mL

## 2020-02-12 MED ORDER — FENTANYL CITRATE (PF) 250 MCG/5ML IJ SOLN
INTRAMUSCULAR | Status: AC
  Filled 2020-02-12: qty 30

## 2020-02-12 MED ORDER — NITROGLYCERIN IN D5W 200-5 MCG/ML-% IV SOLN: 0.0000 ug/min | INTRAVENOUS | Status: DC

## 2020-02-12 MED ORDER — ALBUMIN HUMAN 5 % IV SOLN
250.0000 mL | INTRAVENOUS | Status: AC | PRN
  Administered 2020-02-12 – 2020-02-13 (×3): 12.5 g via INTRAVENOUS
  Filled 2020-02-12: qty 500

## 2020-02-12 MED ORDER — EPHEDRINE SULFATE-NACL 50-0.9 MG/10ML-% IV SOSY
PREFILLED_SYRINGE | INTRAVENOUS | Status: DC | PRN
  Administered 2020-02-12: 5 mg via INTRAVENOUS
  Administered 2020-02-12: 10 mg via INTRAVENOUS
  Administered 2020-02-12: 5 mg via INTRAVENOUS
  Administered 2020-02-12: 2.5 mg via INTRAVENOUS
  Administered 2020-02-12: 5 mg via INTRAVENOUS

## 2020-02-12 MED ORDER — HEMOSTATIC AGENTS (NO CHARGE) OPTIME
TOPICAL | Status: DC | PRN
  Administered 2020-02-12: 1 via TOPICAL

## 2020-02-12 MED ORDER — ALBUMIN HUMAN 5 % IV SOLN: INTRAVENOUS | Status: DC | PRN

## 2020-02-12 MED ORDER — CHLORHEXIDINE GLUCONATE 0.12 % MT SOLN
15.0000 mL | Freq: Two times a day (BID) | OROMUCOSAL | Status: DC
  Administered 2020-02-13 – 2020-02-16 (×7): 15 mL via OROMUCOSAL
  Filled 2020-02-12 (×8): qty 15

## 2020-02-12 MED ORDER — ACETAMINOPHEN 160 MG/5ML PO SOLN: 650.0000 mg | Freq: Once | ORAL | Status: AC

## 2020-02-12 MED ORDER — VANCOMYCIN HCL IN DEXTROSE 1-5 GM/200ML-% IV SOLN
1000.0000 mg | Freq: Once | INTRAVENOUS | Status: AC
  Administered 2020-02-12: 1000 mg via INTRAVENOUS
  Filled 2020-02-12: qty 200

## 2020-02-12 MED ORDER — SODIUM CHLORIDE 0.9 % IV SOLN: INTRAVENOUS | Status: DC | PRN

## 2020-02-12 MED ORDER — DEXTROSE 50 % IV SOLN: 0.0000 mL | INTRAVENOUS | Status: DC | PRN

## 2020-02-12 MED ORDER — HEPARIN SODIUM (PORCINE) 1000 UNIT/ML IJ SOLN
INTRAMUSCULAR | Status: DC | PRN
  Administered 2020-02-12: 8000 [IU] via INTRAVENOUS
  Administered 2020-02-12: 25000 [IU] via INTRAVENOUS

## 2020-02-12 MED ORDER — ACETAMINOPHEN 650 MG RE SUPP
650.0000 mg | Freq: Once | RECTAL | Status: AC
  Administered 2020-02-12: 650 mg via RECTAL

## 2020-02-12 MED ORDER — LACTATED RINGERS IV SOLN: INTRAVENOUS | Status: DC | PRN

## 2020-02-12 MED ORDER — ORAL CARE MOUTH RINSE
15.0000 mL | Freq: Two times a day (BID) | OROMUCOSAL | Status: DC
  Administered 2020-02-13 – 2020-02-16 (×4): 15 mL via OROMUCOSAL

## 2020-02-12 MED ORDER — PHENYLEPHRINE 40 MCG/ML (10ML) SYRINGE FOR IV PUSH (FOR BLOOD PRESSURE SUPPORT)
PREFILLED_SYRINGE | INTRAVENOUS | Status: AC
  Filled 2020-02-12: qty 10

## 2020-02-12 MED ORDER — LACTATED RINGERS IV SOLN: INTRAVENOUS | Status: DC

## 2020-02-12 MED ORDER — DOBUTAMINE IN D5W 4-5 MG/ML-% IV SOLN: 2.5000 ug/kg/min | INTRAVENOUS | Status: DC

## 2020-02-12 MED ORDER — METOPROLOL TARTRATE 25 MG/10 ML ORAL SUSPENSION
12.5000 mg | Freq: Two times a day (BID) | ORAL | Status: DC
  Filled 2020-02-12 (×5): qty 5

## 2020-02-12 MED ORDER — TRAMADOL HCL 50 MG PO TABS
50.0000 mg | ORAL_TABLET | ORAL | Status: DC | PRN
  Administered 2020-02-13 – 2020-02-15 (×4): 100 mg via ORAL
  Filled 2020-02-12 (×6): qty 2

## 2020-02-12 MED ORDER — PANTOPRAZOLE SODIUM 40 MG PO TBEC
40.0000 mg | DELAYED_RELEASE_TABLET | Freq: Every day | ORAL | Status: DC
  Administered 2020-02-14 – 2020-02-17 (×4): 40 mg via ORAL
  Filled 2020-02-12 (×4): qty 1

## 2020-02-12 MED ORDER — 0.9 % SODIUM CHLORIDE (POUR BTL) OPTIME
TOPICAL | Status: DC | PRN
  Administered 2020-02-12: 5000 mL

## 2020-02-12 MED ORDER — ACETAMINOPHEN 500 MG PO TABS
1000.0000 mg | ORAL_TABLET | Freq: Four times a day (QID) | ORAL | Status: DC
  Administered 2020-02-13 – 2020-02-17 (×15): 1000 mg via ORAL
  Filled 2020-02-12 (×17): qty 2

## 2020-02-12 MED ORDER — ONDANSETRON HCL 4 MG/2ML IJ SOLN: 4.0000 mg | Freq: Four times a day (QID) | INTRAMUSCULAR | Status: DC | PRN

## 2020-02-12 MED ORDER — LACTATED RINGERS IV SOLN: 500.0000 mL | Freq: Once | INTRAVENOUS | Status: DC | PRN

## 2020-02-12 MED ORDER — NOREPINEPHRINE 4 MG/250ML-% IV SOLN: 0.0000 ug/min | INTRAVENOUS | Status: DC

## 2020-02-12 MED ORDER — NICARDIPINE HCL IN NACL 20-0.86 MG/200ML-% IV SOLN
3.0000 mg/h | INTRAVENOUS | Status: DC
  Filled 2020-02-12: qty 200

## 2020-02-12 MED ORDER — INSULIN REGULAR(HUMAN) IN NACL 100-0.9 UT/100ML-% IV SOLN: INTRAVENOUS | Status: DC

## 2020-02-12 MED ORDER — SODIUM CHLORIDE 0.9 % IV SOLN
INTRAVENOUS | Status: DC | PRN
  Administered 2020-02-12: 500 mL

## 2020-02-12 MED ORDER — METOPROLOL TARTRATE 12.5 MG HALF TABLET
12.5000 mg | ORAL_TABLET | Freq: Two times a day (BID) | ORAL | Status: DC
  Administered 2020-02-13 – 2020-02-16 (×8): 12.5 mg via ORAL
  Filled 2020-02-12 (×9): qty 1

## 2020-02-12 MED ORDER — ARTIFICIAL TEARS OPHTHALMIC OINT
TOPICAL_OINTMENT | OPHTHALMIC | Status: DC | PRN
  Administered 2020-02-12: 1 via OPHTHALMIC

## 2020-02-12 MED ORDER — DEXMEDETOMIDINE HCL IN NACL 400 MCG/100ML IV SOLN: 0.0000 ug/kg/h | INTRAVENOUS | Status: DC

## 2020-02-12 MED ORDER — ROCURONIUM BROMIDE 10 MG/ML (PF) SYRINGE
PREFILLED_SYRINGE | INTRAVENOUS | Status: DC | PRN
  Administered 2020-02-12: 50 mg via INTRAVENOUS
  Administered 2020-02-12 (×3): 100 mg via INTRAVENOUS

## 2020-02-12 MED ORDER — BISACODYL 5 MG PO TBEC
10.0000 mg | DELAYED_RELEASE_TABLET | Freq: Every day | ORAL | Status: DC
  Administered 2020-02-13 – 2020-02-15 (×3): 10 mg via ORAL
  Filled 2020-02-12 (×4): qty 2

## 2020-02-12 MED ORDER — POTASSIUM CHLORIDE 10 MEQ/50ML IV SOLN
INTRAVENOUS | Status: AC
  Filled 2020-02-12: qty 150

## 2020-02-12 MED ORDER — GLYCOPYRROLATE PF 0.2 MG/ML IJ SOSY
PREFILLED_SYRINGE | INTRAMUSCULAR | Status: AC
  Filled 2020-02-12: qty 1

## 2020-02-12 MED ORDER — SODIUM CHLORIDE 0.9% FLUSH
3.0000 mL | Freq: Two times a day (BID) | INTRAVENOUS | Status: DC
  Administered 2020-02-13 – 2020-02-14 (×2): 3 mL via INTRAVENOUS

## 2020-02-12 MED ORDER — FAMOTIDINE IN NACL 20-0.9 MG/50ML-% IV SOLN
20.0000 mg | Freq: Two times a day (BID) | INTRAVENOUS | Status: AC
  Administered 2020-02-12 (×2): 20 mg via INTRAVENOUS
  Filled 2020-02-12: qty 50

## 2020-02-12 MED ORDER — POTASSIUM CHLORIDE 10 MEQ/50ML IV SOLN: 10.0000 meq | INTRAVENOUS | Status: AC

## 2020-02-12 MED ORDER — SODIUM CHLORIDE 0.45 % IV SOLN: INTRAVENOUS | Status: DC | PRN

## 2020-02-12 MED ORDER — MIDAZOLAM HCL 5 MG/5ML IJ SOLN
INTRAMUSCULAR | Status: DC | PRN
  Administered 2020-02-12: 5 mg via INTRAVENOUS
  Administered 2020-02-12: 2 mg via INTRAVENOUS
  Administered 2020-02-12: 3 mg via INTRAVENOUS

## 2020-02-12 MED ORDER — SODIUM CHLORIDE 0.9 % IV SOLN
INTRAVENOUS | Status: AC
  Filled 2020-02-12: qty 1.2

## 2020-02-12 MED ORDER — SODIUM CHLORIDE 0.9 % IV SOLN: INTRAVENOUS | Status: DC

## 2020-02-12 MED ORDER — CHLORHEXIDINE GLUCONATE 0.12% ORAL RINSE (MEDLINE KIT): 15.0000 mL | Freq: Two times a day (BID) | OROMUCOSAL | Status: DC

## 2020-02-12 MED ORDER — ROCURONIUM BROMIDE 10 MG/ML (PF) SYRINGE
PREFILLED_SYRINGE | INTRAVENOUS | Status: AC
  Filled 2020-02-12: qty 10

## 2020-02-12 MED ORDER — SODIUM CHLORIDE 0.9 % IV SOLN: 250.0000 mL | INTRAVENOUS | Status: DC

## 2020-02-12 MED ORDER — PHENYLEPHRINE 40 MCG/ML (10ML) SYRINGE FOR IV PUSH (FOR BLOOD PRESSURE SUPPORT)
PREFILLED_SYRINGE | INTRAVENOUS | Status: DC | PRN
  Administered 2020-02-12 (×2): 80 ug via INTRAVENOUS

## 2020-02-12 MED ORDER — EPINEPHRINE 1 MG/10ML IJ SOSY
PREFILLED_SYRINGE | INTRAMUSCULAR | Status: AC
  Filled 2020-02-12: qty 20

## 2020-02-12 MED ORDER — MIDAZOLAM HCL (PF) 10 MG/2ML IJ SOLN
INTRAMUSCULAR | Status: AC
  Filled 2020-02-12: qty 2

## 2020-02-12 MED ORDER — MUPIROCIN 2 % EX OINT
1.0000 "application " | TOPICAL_OINTMENT | Freq: Two times a day (BID) | CUTANEOUS | Status: AC
  Administered 2020-02-13 – 2020-02-16 (×6): 1 via NASAL
  Filled 2020-02-12 (×2): qty 22

## 2020-02-12 MED ORDER — FENTANYL CITRATE (PF) 250 MCG/5ML IJ SOLN
INTRAMUSCULAR | Status: DC | PRN
  Administered 2020-02-12 (×2): 100 ug via INTRAVENOUS
  Administered 2020-02-12: 150 ug via INTRAVENOUS
  Administered 2020-02-12 (×2): 100 ug via INTRAVENOUS
  Administered 2020-02-12 (×4): 50 ug via INTRAVENOUS
  Administered 2020-02-12: 150 ug via INTRAVENOUS
  Administered 2020-02-12: 100 ug via INTRAVENOUS
  Administered 2020-02-12 (×2): 50 ug via INTRAVENOUS
  Administered 2020-02-12 (×2): 100 ug via INTRAVENOUS
  Administered 2020-02-12: 50 ug via INTRAVENOUS
  Administered 2020-02-12: 150 ug via INTRAVENOUS

## 2020-02-12 MED ORDER — SODIUM BICARBONATE 8.4 % IV SOLN
INTRAVENOUS | Status: AC
  Filled 2020-02-12: qty 100

## 2020-02-12 MED ORDER — ALBUMIN HUMAN 5 % IV SOLN
INTRAVENOUS | Status: AC
  Filled 2020-02-12: qty 500

## 2020-02-12 MED ORDER — MIDAZOLAM HCL 2 MG/2ML IJ SOLN: 2.0000 mg | INTRAMUSCULAR | Status: DC | PRN

## 2020-02-12 MED ORDER — CALCIUM CHLORIDE 10 % IV SOLN
INTRAVENOUS | Status: AC
  Filled 2020-02-12: qty 10

## 2020-02-12 MED ORDER — ACETAMINOPHEN 160 MG/5ML PO SOLN
1000.0000 mg | Freq: Four times a day (QID) | ORAL | Status: DC
  Filled 2020-02-12: qty 40.6

## 2020-02-12 MED ORDER — ASPIRIN 81 MG PO CHEW: 324.0000 mg | CHEWABLE_TABLET | Freq: Every day | ORAL | Status: DC

## 2020-02-12 MED ORDER — METHYLPREDNISOLONE SODIUM SUCC 125 MG IJ SOLR
80.0000 mg | Freq: Once | INTRAMUSCULAR | Status: AC
  Administered 2020-02-12: 19:00:00 80 mg via INTRAVENOUS
  Filled 2020-02-12: qty 2

## 2020-02-12 MED ORDER — EPHEDRINE 5 MG/ML INJ
INTRAVENOUS | Status: AC
  Filled 2020-02-12: qty 10

## 2020-02-12 MED ORDER — SODIUM CHLORIDE 0.9 % IV SOLN
1.5000 g | Freq: Two times a day (BID) | INTRAVENOUS | Status: AC
  Administered 2020-02-12 – 2020-02-14 (×4): 1.5 g via INTRAVENOUS
  Filled 2020-02-12 (×4): qty 1.5

## 2020-02-12 MED ORDER — PHENYLEPHRINE HCL-NACL 20-0.9 MG/250ML-% IV SOLN: 0.0000 ug/min | INTRAVENOUS | Status: DC

## 2020-02-12 MED ORDER — BISACODYL 10 MG RE SUPP: 10.0000 mg | Freq: Every day | RECTAL | Status: DC

## 2020-02-12 MED ORDER — PROTAMINE SULFATE 10 MG/ML IV SOLN
INTRAVENOUS | Status: DC | PRN
  Administered 2020-02-12: 330 mg via INTRAVENOUS

## 2020-02-12 MED ORDER — PROPOFOL 10 MG/ML IV BOLUS
INTRAVENOUS | Status: AC
  Filled 2020-02-12: qty 20

## 2020-02-12 MED ORDER — CHLORHEXIDINE GLUCONATE CLOTH 2 % EX PADS
6.0000 | MEDICATED_PAD | Freq: Every day | CUTANEOUS | Status: DC
  Administered 2020-02-13 – 2020-02-15 (×4): 6 via TOPICAL

## 2020-02-12 MED ORDER — ORAL CARE MOUTH RINSE
15.0000 mL | OROMUCOSAL | Status: DC
  Administered 2020-02-12: 15 mL via OROMUCOSAL

## 2020-02-12 MED ORDER — OXYCODONE HCL 5 MG PO TABS
5.0000 mg | ORAL_TABLET | ORAL | Status: DC | PRN
  Administered 2020-02-13: 5 mg via ORAL
  Administered 2020-02-13 – 2020-02-14 (×3): 10 mg via ORAL
  Filled 2020-02-12 (×4): qty 2

## 2020-02-12 SURGICAL SUPPLY — 125 items
ADAPTER MULTI PERFUSION 15 (ADAPTER) ×4 IMPLANT
ADH SKN CLS APL DERMABOND .7 (GAUZE/BANDAGES/DRESSINGS) ×6
BAG DECANTER FOR FLEXI CONT (MISCELLANEOUS) ×4 IMPLANT
BASKET HEART (ORDER IN 25'S) (MISCELLANEOUS) ×1
BASKET HEART (ORDER IN 25S) (MISCELLANEOUS) ×3 IMPLANT
BLADE CLIPPER SURG (BLADE) ×4 IMPLANT
BLADE STERNUM SYSTEM 6 (BLADE) ×4 IMPLANT
BNDG ELASTIC 4X5.8 VLCR STR LF (GAUZE/BANDAGES/DRESSINGS) ×4 IMPLANT
BNDG ELASTIC 6X5.8 VLCR STR LF (GAUZE/BANDAGES/DRESSINGS) ×4 IMPLANT
BNDG GAUZE ELAST 4 BULKY (GAUZE/BANDAGES/DRESSINGS) ×4 IMPLANT
BOOT SUTURE AID YELLOW STND (SUTURE) ×1 IMPLANT
CABLE SURGICAL S-101-97-12 (CABLE) ×4 IMPLANT
CANISTER SUCT 3000ML PPV (MISCELLANEOUS) ×8 IMPLANT
CANNULA AORTIC ROOT 9FR (CANNULA) ×4 IMPLANT
CANNULA EZ GLIDE 8.0 24FR (CANNULA) ×5 IMPLANT
CANNULA EZ GLIDE AORTIC 21FR (CANNULA) ×1 IMPLANT
CANNULA MC2 2 STG 29/37 NON-V (CANNULA) ×3 IMPLANT
CANNULA MC2 TWO STAGE (CANNULA) ×1
CANNULA VESSEL 3MM 2 BLNT TIP (CANNULA) ×8 IMPLANT
CANNULA VESSEL 3MM BLUNT TIP (CANNULA) ×4 IMPLANT
CATH ROBINSON RED A/P 18FR (CATHETERS) ×12 IMPLANT
CLIP LIGATING EXTRA MED SLVR (CLIP) ×3 IMPLANT
CLIP LIGATING EXTRA SM BLUE (MISCELLANEOUS) ×4 IMPLANT
CLIP RETRACTION 3.0MM CORONARY (MISCELLANEOUS) ×4 IMPLANT
CLIP VESOCCLUDE MED 24/CT (CLIP) IMPLANT
CLIP VESOCCLUDE SM WIDE 24/CT (CLIP) IMPLANT
CONN ST 1/2X1/2  BEN (MISCELLANEOUS) ×1
CONN ST 1/2X1/2 BEN (MISCELLANEOUS) ×3 IMPLANT
CONNECTOR BLAKE 2:1 CARIO BLK (MISCELLANEOUS) ×4 IMPLANT
COVER MAYO STAND STRL (DRAPES) ×3 IMPLANT
COVER WAND RF STERILE (DRAPES) ×3 IMPLANT
DECANTER SPIKE VIAL GLASS SM (MISCELLANEOUS) IMPLANT
DERMABOND ADVANCED (GAUZE/BANDAGES/DRESSINGS) ×2
DERMABOND ADVANCED .7 DNX12 (GAUZE/BANDAGES/DRESSINGS) ×3 IMPLANT
DRAIN CHANNEL 19F RND (DRAIN) ×11 IMPLANT
DRAIN CONNECTOR BLAKE 1:1 (MISCELLANEOUS) ×4 IMPLANT
DRAIN HEMOVAC 1/8 X 5 (WOUND CARE) IMPLANT
DRAPE CARDIOVASCULAR INCISE (DRAPES) ×4
DRAPE INCISE IOBAN 66X45 STRL (DRAPES) IMPLANT
DRAPE SLUSH/WARMER DISC (DRAPES) ×4 IMPLANT
DRAPE SRG 135X102X78XABS (DRAPES) ×3 IMPLANT
DRSG AQUACEL AG ADV 3.5X14 (GAUZE/BANDAGES/DRESSINGS) ×1 IMPLANT
DRSG COVADERM 4X14 (GAUZE/BANDAGES/DRESSINGS) ×3 IMPLANT
ELECT BLADE 4.0 EZ CLEAN MEGAD (MISCELLANEOUS) ×4
ELECT REM PT RETURN 9FT ADLT (ELECTROSURGICAL) ×8
ELECTRODE BLDE 4.0 EZ CLN MEGD (MISCELLANEOUS) IMPLANT
ELECTRODE REM PT RTRN 9FT ADLT (ELECTROSURGICAL) ×9 IMPLANT
EVACUATOR SILICONE 100CC (DRAIN) IMPLANT
FELT TEFLON 1X6 (MISCELLANEOUS) ×7 IMPLANT
GAUZE SPONGE 4X4 12PLY STRL (GAUZE/BANDAGES/DRESSINGS) ×8 IMPLANT
GLOVE BIO SURGEON STRL SZ 6.5 (GLOVE) ×6 IMPLANT
GLOVE BIO SURGEON STRL SZ7 (GLOVE) ×8 IMPLANT
GLOVE BIO SURGEON STRL SZ7.5 (GLOVE) ×1 IMPLANT
GLOVE BIOGEL M STRL SZ7.5 (GLOVE) ×8 IMPLANT
GLOVE BIOGEL PI IND STRL 6.5 (GLOVE) IMPLANT
GLOVE BIOGEL PI IND STRL 7.0 (GLOVE) IMPLANT
GLOVE BIOGEL PI INDICATOR 6.5 (GLOVE) ×1
GLOVE BIOGEL PI INDICATOR 7.0 (GLOVE) ×4
GLOVE SS BIOGEL STRL SZ 7.5 (GLOVE) ×3 IMPLANT
GLOVE SUPERSENSE BIOGEL SZ 7.5 (GLOVE) ×1
GOWN STRL REUS W/ TWL LRG LVL3 (GOWN DISPOSABLE) ×21 IMPLANT
GOWN STRL REUS W/ TWL XL LVL3 (GOWN DISPOSABLE) ×6 IMPLANT
GOWN STRL REUS W/TWL LRG LVL3 (GOWN DISPOSABLE) ×40
GOWN STRL REUS W/TWL XL LVL3 (GOWN DISPOSABLE) ×8
HEMOSTAT POWDER SURGIFOAM 1G (HEMOSTASIS) ×12 IMPLANT
HEMOSTAT SURGICEL 2X14 (HEMOSTASIS) ×1 IMPLANT
KIT BASIN OR (CUSTOM PROCEDURE TRAY) ×7 IMPLANT
KIT SHUNT ARGYLE CAROTID ART 6 (VASCULAR PRODUCTS) IMPLANT
KIT SUCTION CATH 14FR (SUCTIONS) ×4 IMPLANT
KIT TURNOVER KIT B (KITS) ×8 IMPLANT
KIT VASOVIEW HEMOPRO 2 VH 4000 (KITS) ×4 IMPLANT
LEAD PACING MYOCARDI (MISCELLANEOUS) ×4 IMPLANT
LOOP VESSEL MINI RED (MISCELLANEOUS) ×1 IMPLANT
MARKER GRAFT CORONARY BYPASS (MISCELLANEOUS) ×13 IMPLANT
NEEDLE 22X1 1/2 (OR ONLY) (NEEDLE) IMPLANT
NS IRRIG 1000ML POUR BTL (IV SOLUTION) ×26 IMPLANT
PACK CAROTID (CUSTOM PROCEDURE TRAY) ×4 IMPLANT
PACK E OPEN HEART (SUTURE) ×4 IMPLANT
PACK OPEN HEART (CUSTOM PROCEDURE TRAY) ×4 IMPLANT
PAD ARMBOARD 7.5X6 YLW CONV (MISCELLANEOUS) ×14 IMPLANT
PAD ELECT DEFIB RADIOL ZOLL (MISCELLANEOUS) ×4 IMPLANT
PATCH HEMASHIELD 8X75 (Vascular Products) ×1 IMPLANT
PENCIL BUTTON HOLSTER BLD 10FT (ELECTRODE) ×4 IMPLANT
POSITIONER HEAD DONUT 9IN (MISCELLANEOUS) ×8 IMPLANT
PUNCH AORTIC ROT 4.0MM RCL 40 (MISCELLANEOUS) ×1 IMPLANT
PUNCH AORTIC ROTATE 4.0MM (MISCELLANEOUS) ×4 IMPLANT
SET CARDIOPLEGIA MPS 5001102 (MISCELLANEOUS) ×1 IMPLANT
SHUNT CAROTID BYPASS 10 (VASCULAR PRODUCTS) ×1 IMPLANT
SHUNT CAROTID BYPASS 12FRX15.5 (VASCULAR PRODUCTS) IMPLANT
SPONGE LAP 18X18 RF (DISPOSABLE) ×2 IMPLANT
SPONGE LAP 18X18 X RAY DECT (DISPOSABLE) ×3 IMPLANT
SUT BONE WAX W31G (SUTURE) ×4 IMPLANT
SUT ETHIBOND X763 2 0 SH 1 (SUTURE) ×8 IMPLANT
SUT ETHILON 3 0 FSL (SUTURE) ×1 IMPLANT
SUT ETHILON 3 0 PS 1 (SUTURE) IMPLANT
SUT MNCRL AB 3-0 PS2 18 (SUTURE) ×8 IMPLANT
SUT MNCRL AB 4-0 PS2 18 (SUTURE) ×1 IMPLANT
SUT PDS AB 1 CTX 36 (SUTURE) ×8 IMPLANT
SUT PROLENE 4 0 SH DA (SUTURE) ×4 IMPLANT
SUT PROLENE 5 0 C 1 36 (SUTURE) ×12 IMPLANT
SUT PROLENE 6 0 CC (SUTURE) ×5 IMPLANT
SUT PROLENE 7 0 BV1 MDA (SUTURE) ×5 IMPLANT
SUT SILK 3 0 (SUTURE)
SUT SILK 3-0 18XBRD TIE 12 (SUTURE) IMPLANT
SUT STEEL 6MS V (SUTURE) ×7 IMPLANT
SUT STEEL SZ 6 DBL 3X14 BALL (SUTURE) ×2 IMPLANT
SUT VIC AB 2-0 CT1 27 (SUTURE) ×8
SUT VIC AB 2-0 CT1 TAPERPNT 27 (SUTURE) IMPLANT
SUT VIC AB 3-0 SH 27 (SUTURE) ×8
SUT VIC AB 3-0 SH 27X BRD (SUTURE) ×6 IMPLANT
SUT VIC AB 3-0 X1 27 (SUTURE) ×2 IMPLANT
SUT VICRYL 4-0 PS2 18IN ABS (SUTURE) ×4 IMPLANT
SYR CONTROL 10ML LL (SYRINGE) IMPLANT
SYSTEM SAHARA CHEST DRAIN ATS (WOUND CARE) ×4 IMPLANT
TAPE CLOTH SURG 4X10 WHT LF (GAUZE/BANDAGES/DRESSINGS) ×1 IMPLANT
TAPE PAPER 2X10 WHT MICROPORE (GAUZE/BANDAGES/DRESSINGS) ×1 IMPLANT
TOWEL GREEN STERILE (TOWEL DISPOSABLE) ×8 IMPLANT
TOWEL GREEN STERILE FF (TOWEL DISPOSABLE) ×3 IMPLANT
TRAY FOLEY SLVR 16FR TEMP STAT (SET/KITS/TRAYS/PACK) ×4 IMPLANT
TUBE SUCT INTRACARD DLP 20F (MISCELLANEOUS) ×4 IMPLANT
TUBE SUCTION CARDIAC 10FR (CANNULA) ×4 IMPLANT
TUBING LAP HI FLOW INSUFFLATIO (TUBING) ×5 IMPLANT
UNDERPAD 30X30 (UNDERPADS AND DIAPERS) ×4 IMPLANT
WATER STERILE IRR 1000ML POUR (IV SOLUTION) ×11 IMPLANT
YANKAUER SUCT BULB TIP NO VENT (SUCTIONS) ×1 IMPLANT

## 2020-02-12 NOTE — Brief Op Note (Signed)
02/11/2020 - 02/12/2020  9:36 AM  PATIENT:  Jesus Francis  64 y.o. male  PRE-OPERATIVE DIAGNOSIS:  CORONARY ARTERY DISEASE  POST-OPERATIVE DIAGNOSIS:  CORONARY ARTERY DISEASE  PROCEDURE:  Procedure(s): CORONARY ARTERY BYPASS GRAFTING (CABG) times four, using left internal mammary artery and right greater saphenous vein harvested endoscopically. (N/A) TRANSESOPHAGEAL ECHOCARDIOGRAM (TEE) (N/A) ENDARTERECTOMY CAROTID (Left)  SURGEON:  Surgeon(s) and Role: Panel 1:    * Lightfoot, Lucile Crater, MD - Primary Panel 2:    * Early, Arvilla Meres, MD - Primary  PHYSICIAN ASSISTANT: Ari Engelbrecht PA-C  EBL:  750 mL   BLOOD ADMINISTERED:none  DRAINS: LEFT PLEURAL AND MEDIASTINAL CHEST TUBES   LOCAL MEDICATIONS USED:  NONE  SPECIMEN:  No Specimen  DISPOSITION OF SPECIMEN:  N/A  COUNTS:  YES  TOURNIQUET:  * No tourniquets in log *  DICTATION: .Dragon Dictation  PLAN OF CARE: Admit to inpatient   PATIENT DISPOSITION:  ICU - intubated and hemodynamically stable.   Delay start of Pharmacological VTE agent (>24hrs) due to surgical blood loss or risk of bleeding: yes

## 2020-02-12 NOTE — Progress Notes (Signed)
Follow up 1 hour ABG results (see results review for 2010) called to Dr. Prescott Gum. MD ordered pt to be placed on BIPAP with R 16, FiO2 60, Pressures 12/6 per RT. Repeat ABG in an hour.

## 2020-02-12 NOTE — Progress Notes (Signed)
ANTICOAGULATION CONSULT NOTE  Pharmacy Consult for heparin Indication: chest pain/ACS  No Known Allergies  Patient Measurements: Height: 5\' 9"  (175.3 cm) Weight: 205 lb (93 kg) IBW/kg (Calculated) : 70.7 Heparin Dosing Weight: 93  Vital Signs: Temp: 97.9 F (36.6 C) (02/16 1957) Temp Source: Oral (02/16 1957) BP: 146/87 (02/16 2128) Pulse Rate: 74 (02/16 2128)  Labs: Recent Labs    02/11/20 2307  HGB 14.1  HCT 41.8  PLT 217  HEPARINUNFRC 0.20*  CREATININE 1.05    Estimated Creatinine Clearance: 80 mL/min (by C-G formula based on SCr of 1.05 mg/dL).  Assessment: 64 yo male with multivessel CAD awaiting CABG for heparin. Goal of Therapy:  Heparin level 0.3-0.7 units/ml Monitor platelets by anticoagulation protocol: Yes   Plan:  Increase Heparin  1350 units/hr F/U after CABG tomorrow  Phillis Knack, PharmD, BCPS  02/12/2020

## 2020-02-12 NOTE — Procedures (Signed)
Extubation Procedure Note  Patient Details:   Name: Jesus Francis DOB: 02-27-1956 MRN: [id number]   Airway Documentation:    Vent end date: 02/12/20 Vent end time: 1840   Evaluation  O2 sats: stable throughout Complications: No apparent complications Patient did tolerate procedure well. Bilateral Breath Sounds: Clear, Diminished   Yes  Patient was extubated to 2 LPM nasal cannula. NIF -25 and VC 1L. Patient had positive cuff leak and strong productive cough. BBS clear; diminished. No stridor noted. RT will continue to monitor. Vitals stable.   Quency Tober M 02/12/2020, 7:12 PM

## 2020-02-12 NOTE — Anesthesia Procedure Notes (Signed)
Arterial Line Insertion Start/End2/17/2021 7:45 AM, 02/12/2020 7:55 AM Performed by: Lance Coon, CRNA, CRNA  Preanesthetic checklist: patient identified, IV checked, site marked, risks and benefits discussed, surgical consent, monitors and equipment checked, pre-op evaluation, timeout performed and anesthesia consent Lidocaine 1% used for infiltration and patient sedated Left, radial was placed Catheter size: 20 G Hand hygiene performed , maximum sterile barriers used  and Seldinger technique used  Attempts: 1 Procedure performed without using ultrasound guided technique. Following insertion, dressing applied and Biopatch. Post procedure assessment: normal and unchanged  Patient tolerated the procedure well with no immediate complications.

## 2020-02-12 NOTE — Progress Notes (Signed)
RT placed pt on BIPAP V60 per MD order. Pt is on 12/8 FIO2 60% w/BUR 8. Pt respiratory status is stable at this time. RT will continue to monitor.

## 2020-02-12 NOTE — Progress Notes (Addendum)
Follow up 1 hour ABG results (see results review for 2204) called to Dr. Prescott Gum as ordered. MD ordered to keep pt on BIPAP throughout the nigh, decrease FiO2 to 40t and recheck ABG at 5 am. Pt tolerating BIPAP well with no issues at this time. Will continue to monitor

## 2020-02-12 NOTE — Anesthesia Postprocedure Evaluation (Signed)
Anesthesia Post Note  Patient: MYKAEL DEPENA  Procedure(s) Performed: CORONARY ARTERY BYPASS GRAFTING (CABG) times four, using left internal mammary artery and right greater saphenous vein harvested endoscopically. (N/A Chest) TRANSESOPHAGEAL ECHOCARDIOGRAM (TEE) (N/A ) ENDARTERECTOMY CAROTID (Left )     Patient location during evaluation: ICU Anesthesia Type: General Level of consciousness: sedated and patient remains intubated per anesthesia plan Pain management: pain level controlled Vital Signs Assessment: post-procedure vital signs reviewed and stable Respiratory status: patient remains intubated per anesthesia plan Cardiovascular status: stable Postop Assessment: no apparent nausea or vomiting Anesthetic complications: no    Last Vitals:  Vitals:   02/12/20 0600 02/12/20 1523  BP: (!) 151/102   Pulse:  71  Resp:  17  Temp:    SpO2:  99%    Last Pain:  Vitals:   02/12/20 0424  TempSrc: Oral  PainSc:                  Audry Pili

## 2020-02-12 NOTE — Op Note (Signed)
    OPERATIVE REPORT  DATE OF SURGERY: 02/12/2020  PATIENT: Jesus Francis, 64 y.o. male MRN: [id number]  DOB: November 21, 1956  PRE-OPERATIVE DIAGNOSIS: Severe symptomatic left internal carotid artery stenosis and critical coronary disease  POST-OPERATIVE DIAGNOSIS:  Same  PROCEDURE: Left carotid endarterectomy and Dacron patch angioplasty  SURGEON:  Curt Jews, M.D.  PHYSICIAN ASSISTANT: Arlee Muslim, PA-C  ANESTHESIA: General  EBL: per anesthesia record  Total I/O In: 3150 [I.V.:2700; Blood:300; IV Piggyback:150] Out: 2965 [Urine:2215; Blood:750]  BLOOD ADMINISTERED: none  DRAINS: none  SPECIMEN: none  COUNTS CORRECT:  YES  PATIENT DISPOSITION:  PACU - hemodynamically stable  PROCEDURE DETAILS: The patient was taken operating placed supine position where the area of the left neck was prepped draped in usual sterile fashion in addition to the standard prepping for coronary artery bypass grafting.  Incision was made anterior sternocleidomastoid carried down through the platysma electrocautery.  Sternocleidomastoid flex.  Posteriorly and the carotid sheath was opened.  Facial vein was ligated with 2-0 silk ties and divided.  The common carotid artery was encircled with an umbilical tape and Rummel tourniquet.  The superior thyroid artery was circumflex with a 2-0 silk Potts tie.  The external carotid encircled with a blue vessel loop and the internal carotid was encircled with a umbilical tape and Rummel tourniquet.  The patient was given 8000 units of intravenous heparin after adequate circulation time the internal/external common carotid arteries were occluded.  Common carotid artery was opened with 11 blade and sent longstanding with pot scissors through the plaque into the internal carotid.  The patient had excellent backbleeding and the plaque did not extend up into the internal carotid therefore decision was made not to place a shunt.  The common carotid artery plaque was  transected proximally and then artery external to the bifurcation.  The external carotid was endarterectomized with eversion technique in the internal carotid in an open fashion.  Remaining atheromatous debris removed from the endarterectomy plane.  A Finesse Hemashield Dacron patch was brought onto the field and was sewn as a patch angioplasty with a running 6-0 Prolene suture.  The usual flushing maneuvers were undertaken anastomosis was completed.  Flow was restored first to the external and then the internal carotid artery.  Excellent flow Pasty Spillers risks were noted with hand-held Doppler in the internal and external carotid arteries.  Several additional sutures were required for hemostasis.  Patient then was to undergo coronary bypass will be dictated separate note by Dr. Kipp Brood.  A distal and Ray-Tec sponge was placed in the neck for temporary closure and this was closed temporarily with 2-0 nylon sutures.  At the completion of the coronary bypass grafting this was reirrigated and closed.   Rosetta Posner, M.D., [facility] 02/12/2020 2:51 PM

## 2020-02-12 NOTE — Anesthesia Procedure Notes (Signed)
Central Venous Catheter Insertion Performed by: Lillia Abed, MD, anesthesiologist Start/End2/17/2021 7:30 AM, 02/12/2020 7:40 AM Patient location: Pre-op. Preanesthetic checklist: patient identified, IV checked, risks and benefits discussed, surgical consent, monitors and equipment checked, pre-op evaluation, timeout performed and anesthesia consent Position: Trendelenburg Lidocaine 1% used for infiltration and patient sedated Hand hygiene performed  and maximum sterile barriers used  Catheter size: 8.5 Fr Central line was placed.Sheath introducer Procedure performed using ultrasound guided technique. Ultrasound Notes:anatomy identified, needle tip was noted to be adjacent to the nerve/plexus identified, no ultrasound evidence of intravascular and/or intraneural injection and image(s) printed for medical record Attempts: 1 Following insertion, line sutured and dressing applied. Post procedure assessment: blood return through all ports, free fluid flow and no air  Patient tolerated the procedure well with no immediate complications.

## 2020-02-12 NOTE — Transfer of Care (Signed)
Immediate Anesthesia Transfer of Care Note  Patient: Jesus Francis  Procedure(s) Performed: CORONARY ARTERY BYPASS GRAFTING (CABG) times four, using left internal mammary artery and right greater saphenous vein harvested endoscopically. (N/A Chest) TRANSESOPHAGEAL ECHOCARDIOGRAM (TEE) (N/A ) ENDARTERECTOMY CAROTID (Left )  Patient Location: ICU  Anesthesia Type:General  Level of Consciousness: Patient remains intubated per anesthesia plan  Airway & Oxygen Therapy: Patient remains intubated per anesthesia plan and Patient placed on Ventilator (see vital sign flow sheet for setting)  Post-op Assessment: Report given to RN and Post -op Vital signs reviewed and stable  Post vital signs: Reviewed and stable  Last Vitals:  Vitals Value Taken Time  BP 129/78 02/12/20 1532  Temp 35.7 C 02/12/20 1533  Pulse 70 02/12/20 1533  Resp 8 02/12/20 1533  SpO2 100 % 02/12/20 1533  Vitals shown include unvalidated device data.  Last Pain:  Vitals:   02/12/20 0424  TempSrc: Oral  PainSc:       Patients Stated Pain Goal: 5 ([id number])  Complications: No apparent anesthesia complications

## 2020-02-12 NOTE — Anesthesia Procedure Notes (Signed)
Procedure Name: Intubation Date/Time: 02/12/2020 8:50 AM Performed by: Lance Coon, CRNA Pre-anesthesia Checklist: Emergency Drugs available, Patient identified, Suction available, Patient being monitored and Timeout performed Patient Re-evaluated:Patient Re-evaluated prior to induction Oxygen Delivery Method: Circle system utilized Preoxygenation: Pre-oxygenation with 100% oxygen Induction Type: IV induction Ventilation: Mask ventilation without difficulty Laryngoscope Size: Miller and 3 Grade View: Grade I Tube type: Oral Tube size: 8.0 mm Number of attempts: 1 Airway Equipment and Method: Stylet Placement Confirmation: ETT inserted through vocal cords under direct vision,  positive ETCO2 and breath sounds checked- equal and bilateral Secured at: 22 cm Tube secured with: Tape Dental Injury: Teeth and Oropharynx as per pre-operative assessment

## 2020-02-12 NOTE — Discharge Summary (Signed)
Physician Discharge Summary  Patient ID: CHALLEN MAQUEDA MRN: [id number] DOB/AGE: [birth year] 64 y.o.  Admit date: 02/11/2020 Discharge date: 02/17/2020  Admission Diagnoses:  Patient Active Problem List   Diagnosis Date Noted  . Coronary artery disease 02/11/2020  . CAD in native artery   . Angina pectoris [facility])    Discharge Diagnoses:   Patient Active Problem List   Diagnosis Date Noted  . S/P CABG x 4 02/12/2020  . H/O carotid endarterectomy 02/12/2020  . Coronary artery disease 02/11/2020  . CAD in native artery   . Angina pectoris ([place])    Discharged Condition: good  History of Present Illness:    Mr. Sonner is a 64 yo male with history of HTN, Hyperlipidemia, H/O kidney stones, H/O internal hemorrhoids, hematuria with negative urology workup, and aortoiliac atherosclerosis.  The patient presented with complaints of angina dating back to Thanksgiving.  Workup consisted of a coronary CT scan which was concerning for CAD.  He underwent elective coronary catheterization which showed multivessel CAD.  It was felt coronary bypass grafting would be indicated and TCTS consult was requested.  He was evaluated by Dr. Kipp Brood who felt coronary bypass grafting would be indicated.  The risks and benefits of the procedure were explained to the patient and he was agreeable to proceed.  His carotid duplex showed critical stenosis.  Vascular consult was obtained and they recommended endarterectomy.  This would be done at the same time as his coronary bypass procedure.    Hospital Course:   The patient was taken to the operating room on 65/17/2021.  He underwent Left carotid endarterectomy and CABG x 4 utilizing LIMA to LAD, RSVG to PDA, RSVG to OM1, and RSVG to Diagonal.  He also underwent endoscopic harvest greater saphenous vein from his right leg.  He tolerated the procedure without difficulty and was taken to the SICU in stable condition.  He was extubated the evening of surgery.   During his stay in the SICU the patient was weaned off Neo-synephrine as tolerated.  His arterial line and chest tubes were removed without difficulty.  He developed Atrial Fibrillation with RVR.  He was treated with IV Amiodarone with conversion to NSR.  He was medically stable for transfer to the telemetry unit.  He continued to make good progress.  He remains in NSR.  His blood pressure was slightly elevated and his Lopressor was titrated.  He developed decreased oxygen levels at night when he sleeps.  He will require sleep study as an outpatient.  He is ambulating independently.  His incisions are healing without evidence of infection.  The patient is medically stable for discharge home today.  Significant Diagnostic Studies: angiography:    Ramus lesion is 50% stenosed.  Dist LM to Ost LAD lesion is 30% stenosed.  1st Mrg lesion is 60% stenosed.  Prox LAD lesion is 90% stenosed.  Prox LAD to Mid LAD lesion is 60% stenosed.  Mid LAD lesion is 70% stenosed.  Prox RCA lesion is 99% stenosed.  Ost RCA to Prox RCA lesion is 90% stenosed.  Mid RCA lesion is 50% stenosed.  Dist RCA lesion is 20% stenosed.   Severe coronary calcification with multivessel CAD.  There is 30% distal narrowing of the left main coronary artery prior to its trifurcation into the LAD, ramus intermediate, and left circumflex vessel. The LAD has 90% very proximal (near ostial) stenosis followed by diffuse 60 and 70% proximal to mid calcified stenoses with diffuse 70% stenosis in the  diagonal vessel; 50% very proximal ramus intermediate stenosis; 60% ostial OM1 stenosis and large dominant RCA with 90% proximal followed by 99% calcified proximal to mid stenosis with 50% mid stenosis and mild 20% areas of luminal irregularity.  Treatments: surgery:   CABG X 4.  LIMA LAD, RSVG PDA, Ramus, and OM1 Endoscopic greater saphenous vein harvest on the right Intra-operative Transesophageal Echocardiogram (Left carotid  endarterectomy and Dacron patch angioplasty - Please refer to Dr. Luther Parody note for further details)  Discharge Exam: Blood pressure 122/74, pulse 93, temperature 98 F (36.7 C), temperature source Oral, resp. rate 16, height 5\' 9"  (1.753 m), weight 90.5 kg, SpO2 95 %.  General appearance: alert, cooperative and no distress Heart: regular rate and rhythm Lungs: clear to auscultation bilaterally Abdomen: soft, non-tender; bowel sounds normal; no masses,  no organomegaly Extremities: edema trace, ecchymosis RLE Wound: clean and dry incision sites, mild swelling along neck incision  Discharge Medications:   The patient has been discharged on:   1.Beta Blocker:  Yes [ X  ]                              No   [   ]                              If No, reason:  2.Ace Inhibitor/ARB: Yes [   ]                                     No  [  X  ]                                     If No, reason: labile BP 3.Statin:   Yes [ X  ]                  No  [   ]                  If No, reason:  4.Ecasa:  Yes  [ X  ]                  No   [   ]                  If No, reason:    Discharge Instructions    Amb Referral to Cardiac Rehabilitation   Complete by: As directed    Diagnosis: CABG   CABG X ___: 4   After initial evaluation and assessments completed: Virtual Based Care may be provided alone or in conjunction with Phase 2 Cardiac Rehab based on patient barriers.: Yes     Allergies as of 02/17/2020   No Known Allergies     Medication List    STOP taking these medications   ibuprofen 200 MG tablet Commonly known as: ADVIL   isosorbide mononitrate 30 MG 24 hr tablet Commonly known as: IMDUR   valsartan 160 MG tablet Commonly known as: DIOVAN     TAKE these medications   acetaminophen 500 MG tablet Commonly known as: TYLENOL Take 2 tablets (1,000 mg total) by mouth every 6 (six) hours as needed.   amiodarone 400 MG tablet Commonly known as:  PACERONE Take 1 tablet (400 mg  total) by mouth 2 (two) times daily. For 7 days, then decrease to 400 mg daily   aspirin EC 81 MG tablet Take 81 mg by mouth at bedtime.   ezetimibe 10 MG tablet Commonly known as: ZETIA Take 1 tablet (10 mg total) by mouth daily. What changed: when to take this   furosemide 40 MG tablet Commonly known as: LASIX Take 1 tablet (40 mg total) by mouth daily.   metoprolol succinate 25 MG 24 hr tablet Commonly known as: Toprol XL Take 1 tablet (25 mg total) by mouth 2 (two) times daily.   nitroGLYCERIN 0.4 MG SL tablet Commonly known as: NITROSTAT Place 1 tablet (0.4 mg total) under the tongue every 5 (five) minutes as needed for chest pain. What changed: when to take this   Potassium Chloride ER 20 MEQ Tbcr Take 20 mEq by mouth daily.   rosuvastatin 40 MG tablet Commonly known as: CRESTOR Take 40 mg by mouth every evening.   traMADol 50 MG tablet Commonly known as: ULTRAM Take 1-2 tablets (50-100 mg total) by mouth every 4 (four) hours as needed for moderate pain.            Durable Medical Equipment  (From admission, onward)         Start     Ordered   02/16/20 0742  For home use only DME oxygen  Once    Question Answer Comment  Length of Need 6 Months   Mode or (Route) Nasal cannula   Liters per Minute 2   Frequency Only at night (stationary unit needed)   Oxygen conserving device Yes   Oxygen delivery system Gas      02/16/20 0741         Follow-up Information    Lajuana Matte, MD Follow up on 02/12/2020.   Specialty: Cardiothoracic Surgery Contact information: [place] [address] [phone]        Abigail Butts., PA-C Follow up on 02/27/2020.   Specialty: Physician Assistant Why: Please arrive 15 minutes early for your 3:15pm post-hospital cardiology follow-up appointment.  Contact information: [place] [address] [phone]        Rosetta Posner, MD Follow up in 3  week(s).   Specialties: Vascular Surgery, Cardiology Why: Post op follow up s/p L CEA. Office will call patient with appointment information Contact information: [address] [place] [address] [phone]        Troy Sine, MD Follow up.   Specialty: Cardiology Why: Please follow-up within 2 weeks for sleep apnea Contact information: [address] [place] [address] [phone]           Signed:  Ellwood Handler, PA-C 02/17/2020, 12:35 PM

## 2020-02-12 NOTE — Progress Notes (Signed)
CT surgery p.m. Rounds  Status post combined CABG-right carotid endarterectomy Patient extubated with upper airway wheezing and hoarseness Able to project tongue midline, other neuro parameters intact Postextubation ABG with elevated PCO2, PO2 normal -we will start on inhaled nebulized therapy and give 1 dose of IV Solu-Medrol.  Follow-up blood gas this evening ordered

## 2020-02-12 NOTE — Op Note (Signed)
[address]       [place] [address]             [phone]                                          02/12/2020 Patient:  Jesus Francis Pre-Op Dx: Severe 3V CAD.   Severe left carotid artery stenosis     Hypertension   Hyperlipidemia Post-op Dx:  same Procedure: CABG X 4.  LIMA LAD, RSVG PDA, Ramus, and OM1   Endoscopic greater saphenous vein harvest on the right Intra-operative Transesophageal Echocardiogram (Left carotid endarterectomy and Dacron patch angioplasty - Please refer to Dr. Luther Parody note for further details)  Surgeon and Role:      * Lajuana Matte, MD - Primary Assistant: Jadene Pierini, PA-C  Anesthesia  general EBL:  768ml Blood Administration: none Xclamp Time:  63 min   Drains: 49 F blake drain: L, mediastinal  Wires: none Counts: correct   Indications: 64 yo male with 3V CAD, preserved EF, and good targets for surgical bypass.  He was also found to have severe left carotid stenosis, and has had symptoms from this problem.  Vascular surgery was consulted, and we elected to proceed with a combined left carotid endarterectomy followed by CABG.    Findings: Good LIMA.  Good targets.  Good flows on vein grafts.  Good function on post CPB TEE.  Operative Technique: All invasive lines were placed in pre-op holding.  After the risks, benefits and alternatives were thoroughly discussed, the patient was brought to the operative theatre.  Anesthesia was induced, and the patient was prepped and draped in normal sterile fashion.  An appropriate surgical pause was performed, and pre-operative antibiotics were dosed accordingly.  Dr. Donnetta Hutching began with the carotid endarterectomy.  Please refer to his note for further details.  At the same time, the right saphenous vein was harvested endovascularly.  Once the carotid procedure was performed, I began with a median sternotomy.   This was carried down with bovie cautery, and the sternum was  divided with a reciprocating saw.  Meticulous hemostasis was obtained.  The left internal thoracic artery was exposed and harvested in in pedicled fashion.  The patient was systemically heparinized, and the artery was divided distally, and placed in a papaverine sponge.    The sternal elevator was removed, and a retractor was placed.  The pericardium was divided in the midline and fashioned into a cradle with pericardial stitches.   After we confirmed an appropriate ACT, the ascending aorta was cannulated in standard fashion.  The right atrial appendage was used for venous cannulation site.  Cardiopulmonary bypass was initiated, and the heart retractor was placed. The cross clamp was applied, and a dose of anterograde cardioplegia was given with good arrest of the heart.  We moved to the posterior wall of the heart, and found a good target on the PDA.  An arteriotomy was made, and the vein graft was anastomosed to it in an end to side fashion.  Next we exposed the lateral wall, and found a good target on the OM1.  An end to side anastomosis with the vein graft was then created.  Next, we exposed the anterior lateral wall of the heart and identified a good target on ramus.   An arteriotomy was created.  The  vein was anastomosed in an end to side fashion.  Finally, we exposed a good target on the LAD, and fashioned an end to side anastomosis between it and the LITA.  We began to re-warm, and a re-animation dose of cardioplegia was given.  The heart was de-aired, and the cross clamp was removed.  Meticulous hemostasis was obtained.    A partial occludding clamp was then placed on the ascending aorta, and we created an end to side anastomosis between it and the proximal vein grafts.  The proximal sites were marked with rings.  Hemostasis was obtained, and we separated from cardiopulmonary bypass without event.  The heparin was reversed with protamine.  Chest tubes and wires were placed, and the sternum was  re-approximated with with sternal wires.  The soft tissue and skin were re-approximated wth absorbable suture.  We then closed the neck incision in 2 layers with absorbable suture.    The patient tolerated the procedure without any immediate complications, and was transferred to the ICU in guarded condition.  Jesus Francis

## 2020-02-12 NOTE — Plan of Care (Signed)
  Problem: Clinical Measurements: Goal: Cardiovascular complication will be avoided Outcome: Progressing   Problem: Clinical Measurements: Goal: Respiratory complications will improve Outcome: Progressing   Problem: Clinical Measurements: Goal: Ability to maintain clinical measurements within normal limits will improve Outcome: Progressing   Problem: Pain Managment: Goal: General experience of comfort will improve Outcome: Progressing

## 2020-02-12 NOTE — Progress Notes (Signed)
     [address]       [place] [address]             [phone]       No events overnight.  Seen by Vascular surgery for left carotid stenosis  Vitals:   02/12/20 0558 02/12/20 0600  BP: (!) 151/102 (!) 151/102  Pulse: 70   Resp:    Temp:    SpO2:     Alert NAD Sinus EWOB  Labs and studies reviewed  64 yo male with 3V CAD, and left carotid stenosis OR today with combined CEA (Dr. Early)/CABG 4 today  Lajuana Matte

## 2020-02-12 NOTE — Interval H&P Note (Signed)
History and Physical Interval Note:  02/12/2020 8:02 AM  Jesus Francis  has presented today for surgery, with the diagnosis of CAD.  The various methods of treatment have been discussed with the patient and family. After consideration of risks, benefits and other options for treatment, the patient has consented to  Procedure(s): CORONARY ARTERY BYPASS GRAFTING (CABG) (N/A) TRANSESOPHAGEAL ECHOCARDIOGRAM (TEE) (N/A) ENDARTERECTOMY CAROTID (Left) as a surgical intervention.  The patient's history has been reviewed, patient examined, no change in status, stable for surgery.  I have reviewed the patient's chart and labs.  Questions were answered to the patient's satisfaction.     Curt Jews

## 2020-02-13 ENCOUNTER — Inpatient Hospital Stay (HOSPITAL_COMMUNITY): Payer: 59

## 2020-02-13 ENCOUNTER — Telehealth: Payer: Self-pay | Admitting: Cardiovascular Disease

## 2020-02-13 LAB — POCT I-STAT 7, (LYTES, BLD GAS, ICA,H+H)
Acid-base deficit: 3 mmol/L — ABNORMAL HIGH (ref 0.0–2.0)
Bicarbonate: 23.3 mmol/L (ref 20.0–28.0)
Bicarbonate: 23.9 mmol/L (ref 20.0–28.0)
Calcium, Ion: 1.21 mmol/L (ref 1.15–1.40)
Calcium, Ion: 1.19 mmol/L (ref 1.15–1.40)
HCT: 31 % — ABNORMAL LOW (ref 39.0–52.0)
HCT: 19 % — ABNORMAL LOW (ref 39.0–52.0)
Hemoglobin: 10.5 g/dL — ABNORMAL LOW (ref 13.0–17.0)
Hemoglobin: 6.5 g/dL — CL (ref 13.0–17.0)
O2 Saturation: 100 %
O2 Saturation: 100 %
Patient temperature: 37.4
Patient temperature: 36.9
Potassium: 3.9 mmol/L (ref 3.5–5.1)
Potassium: 4 mmol/L (ref 3.5–5.1)
Sodium: 142 mmol/L (ref 135–145)
Sodium: 141 mmol/L (ref 135–145)
TCO2: 25 mmol/L (ref 22–32)
TCO2: 25 mmol/L (ref 22–32)
pCO2 arterial: 46 mmHg (ref 32.0–48.0)
pCO2 arterial: 33.9 mmHg (ref 32.0–48.0)
pH, Arterial: 7.315 — ABNORMAL LOW (ref 7.350–7.450)
pH, Arterial: 7.455 — ABNORMAL HIGH (ref 7.350–7.450)
pO2, Arterial: 193 mmHg — ABNORMAL HIGH (ref 83.0–108.0)
pO2, Arterial: 161 mmHg — ABNORMAL HIGH (ref 83.0–108.0)

## 2020-02-13 LAB — CBC
HCT: 29.1 % — ABNORMAL LOW (ref 39.0–52.0)
HCT: 27.3 % — ABNORMAL LOW (ref 39.0–52.0)
HCT: 27.6 % — ABNORMAL LOW (ref 39.0–52.0)
Hemoglobin: 9.9 g/dL — ABNORMAL LOW (ref 13.0–17.0)
Hemoglobin: 9.1 g/dL — ABNORMAL LOW (ref 13.0–17.0)
Hemoglobin: 9.3 g/dL — ABNORMAL LOW (ref 13.0–17.0)
MCH: 31.3 pg (ref 26.0–34.0)
MCH: 31.2 pg (ref 26.0–34.0)
MCH: 31.2 pg (ref 26.0–34.0)
MCHC: 34 g/dL (ref 30.0–36.0)
MCHC: 33.3 g/dL (ref 30.0–36.0)
MCHC: 33.7 g/dL (ref 30.0–36.0)
MCV: 92.1 fL (ref 80.0–100.0)
MCV: 93.5 fL (ref 80.0–100.0)
MCV: 92.6 fL (ref 80.0–100.0)
Platelets: 131 10*3/uL — ABNORMAL LOW (ref 150–400)
Platelets: 121 10*3/uL — ABNORMAL LOW (ref 150–400)
Platelets: 133 10*3/uL — ABNORMAL LOW (ref 150–400)
RBC: 3.16 MIL/uL — ABNORMAL LOW (ref 4.22–5.81)
RBC: 2.92 MIL/uL — ABNORMAL LOW (ref 4.22–5.81)
RBC: 2.98 MIL/uL — ABNORMAL LOW (ref 4.22–5.81)
RDW: 12.2 % (ref 11.5–15.5)
RDW: 12.3 % (ref 11.5–15.5)
RDW: 12.4 % (ref 11.5–15.5)
WBC: 14.8 10*3/uL — ABNORMAL HIGH (ref 4.0–10.5)
WBC: 17.2 10*3/uL — ABNORMAL HIGH (ref 4.0–10.5)
WBC: 21.4 10*3/uL — ABNORMAL HIGH (ref 4.0–10.5)
nRBC: 0 % (ref 0.0–0.2)
nRBC: 0 % (ref 0.0–0.2)
nRBC: 0 % (ref 0.0–0.2)

## 2020-02-13 LAB — BASIC METABOLIC PANEL
Anion gap: 10 (ref 5–15)
Anion gap: 7 (ref 5–15)
BUN: 12 mg/dL (ref 8–23)
BUN: 14 mg/dL (ref 8–23)
CO2: 22 mmol/L (ref 22–32)
CO2: 26 mmol/L (ref 22–32)
Calcium: 8.8 mg/dL — ABNORMAL LOW (ref 8.9–10.3)
Calcium: 8.8 mg/dL — ABNORMAL LOW (ref 8.9–10.3)
Chloride: 107 mmol/L (ref 98–111)
Chloride: 105 mmol/L (ref 98–111)
Creatinine, Ser: 1.03 mg/dL (ref 0.61–1.24)
Creatinine, Ser: 0.92 mg/dL (ref 0.61–1.24)
GFR calc Af Amer: >60 mL/min (ref >60)
GFR calc Af Amer: >60 mL/min (ref >60)
GFR calc non Af Amer: >60 mL/min (ref >60)
GFR calc non Af Amer: >60 mL/min (ref >60)
Glucose, Bld: 180 mg/dL — ABNORMAL HIGH (ref 70–99)
Glucose, Bld: 162 mg/dL — ABNORMAL HIGH (ref 70–99)
Potassium: 4.2 mmol/L (ref 3.5–5.1)
Potassium: 4.2 mmol/L (ref 3.5–5.1)
Sodium: 139 mmol/L (ref 135–145)
Sodium: 138 mmol/L (ref 135–145)

## 2020-02-13 LAB — MAGNESIUM
Magnesium: 2.3 mg/dL (ref 1.7–2.4)
Magnesium: 2.3 mg/dL (ref 1.7–2.4)

## 2020-02-13 LAB — GLUCOSE, CAPILLARY
Glucose-Capillary: 132 mg/dL — ABNORMAL HIGH (ref 70–99)
Glucose-Capillary: 137 mg/dL — ABNORMAL HIGH (ref 70–99)
Glucose-Capillary: 138 mg/dL — ABNORMAL HIGH (ref 70–99)
Glucose-Capillary: 144 mg/dL — ABNORMAL HIGH (ref 70–99)
Glucose-Capillary: 145 mg/dL — ABNORMAL HIGH (ref 70–99)
Glucose-Capillary: 146 mg/dL — ABNORMAL HIGH (ref 70–99)
Glucose-Capillary: 146 mg/dL — ABNORMAL HIGH (ref 70–99)
Glucose-Capillary: 148 mg/dL — ABNORMAL HIGH (ref 70–99)
Glucose-Capillary: 155 mg/dL — ABNORMAL HIGH (ref 70–99)
Glucose-Capillary: 160 mg/dL — ABNORMAL HIGH (ref 70–99)
Glucose-Capillary: 161 mg/dL — ABNORMAL HIGH (ref 70–99)
Glucose-Capillary: 162 mg/dL — ABNORMAL HIGH (ref 70–99)
Glucose-Capillary: 164 mg/dL — ABNORMAL HIGH (ref 70–99)
Glucose-Capillary: 165 mg/dL — ABNORMAL HIGH (ref 70–99)
Glucose-Capillary: 167 mg/dL — ABNORMAL HIGH (ref 70–99)
Glucose-Capillary: 177 mg/dL — ABNORMAL HIGH (ref 70–99)

## 2020-02-13 LAB — CREATININE, SERUM
Creatinine, Ser: 1.01 mg/dL (ref 0.61–1.24)
GFR calc Af Amer: >60 mL/min (ref >60)
GFR calc non Af Amer: >60 mL/min (ref >60)

## 2020-02-13 MED ORDER — INSULIN ASPART 100 UNIT/ML ~~LOC~~ SOLN
0.0000 [IU] | SUBCUTANEOUS | Status: DC
  Administered 2020-02-13 – 2020-02-14 (×8): 2 [IU] via SUBCUTANEOUS

## 2020-02-13 MED ORDER — SODIUM CHLORIDE 0.9% FLUSH: 10.0000 mL | INTRAVENOUS | Status: DC | PRN

## 2020-02-13 MED ORDER — LEVALBUTEROL HCL 1.25 MG/0.5ML IN NEBU: 1.2500 mg | INHALATION_SOLUTION | Freq: Four times a day (QID) | RESPIRATORY_TRACT | Status: DC | PRN

## 2020-02-13 MED ORDER — ENOXAPARIN SODIUM 40 MG/0.4ML ~~LOC~~ SOLN
40.0000 mg | Freq: Every day | SUBCUTANEOUS | Status: DC
  Administered 2020-02-13 – 2020-02-16 (×4): 40 mg via SUBCUTANEOUS
  Filled 2020-02-13 (×4): qty 0.4

## 2020-02-13 MED ORDER — SODIUM CHLORIDE 0.9% FLUSH
10.0000 mL | Freq: Two times a day (BID) | INTRAVENOUS | Status: DC
  Administered 2020-02-13 – 2020-02-16 (×3): 10 mL

## 2020-02-13 NOTE — Progress Notes (Signed)
Pts order for BIPAP is PRN and pts respiratory status is stable at this time. RT will continue to monitor.

## 2020-02-13 NOTE — Progress Notes (Signed)
Patient ID: Jesus Francis, male   DOB: April 23, 1956, 64 y.o.   MRN: [id number]  Progress Note    02/13/2020 7:30 AM 1 Day Post-Op  Subjective: Awake and alert.  Extubated.  Currently on BiPAP.  Comfortable   Vitals:   02/13/20 0600 02/13/20 0606  BP: 112/60 (!) 133/53  Pulse: 73 78  Resp: (!) 8 (!) 22  Temp: 99.1 F (37.3 C)   SpO2: 100%    Physical Exam: Left neck incision without hematoma.  Neurologically intact.  CBC    Component Value Date/Time   WBC 14.8 (H) 02/13/2020 0450   RBC 3.16 (L) 02/13/2020 0450   HGB 6.5 (LL) 02/13/2020 0459   HGB 15.9 02/05/2020 1433   HCT 19.0 (L) 02/13/2020 0459   HCT 46.9 02/05/2020 1433   PLT 131 (L) 02/13/2020 0450   PLT 247 02/05/2020 1433   MCV 92.1 02/13/2020 0450   MCV 91 02/05/2020 1433   MCH 31.3 02/13/2020 0450   MCHC 34.0 02/13/2020 0450   RDW 12.2 02/13/2020 0450   RDW 11.9 02/05/2020 1433    BMET    Component Value Date/Time   NA 141 02/13/2020 0459   NA 142 02/05/2020 1433   K 4.0 02/13/2020 0459   CL 107 02/13/2020 0450   CO2 22 02/13/2020 0450   GLUCOSE 180 (H) 02/13/2020 0450   BUN 12 02/13/2020 0450   BUN 15 02/05/2020 1433   CREATININE 1.03 02/13/2020 0450   CALCIUM 8.8 (L) 02/13/2020 0450   GFRNONAA >60 02/13/2020 0450   GFRAA >60 02/13/2020 0450    INR    Component Value Date/Time   INR 1.4 (H) 02/12/2020 1550     Intake/Output Summary (Last 24 hours) at 02/13/2020 0730 Last data filed at 02/13/2020 0700 Gross per 24 hour  Intake 4718.53 ml  Output 5045 ml  Net -326.47 ml     Assessment/Plan:  64 y.o. male stable postop day 1 from left carotid endarterectomy for severe symptomatic carotid in the same setting as coronary artery bypass with Dr. Kipp Brood.  I will be out of town tomorrow.  My partners are available if there are any concerns.  I will see him again in the office in 3 weeks     Rosetta Posner, MD [facility] [phone] 02/13/2020 7:30 AM

## 2020-02-13 NOTE — Progress Notes (Signed)
      [address]       [place] [address]             [phone]                 1 Day Post-Op Procedure(s) (LRB): CORONARY ARTERY BYPASS GRAFTING (CABG) times four, using left internal mammary artery and right greater saphenous vein harvested endoscopically. (N/A) TRANSESOPHAGEAL ECHOCARDIOGRAM (TEE) (N/A) ENDARTERECTOMY CAROTID (Left)   Events: Extubated overnight.  Placed on bipap.  Complains of chest wall pain _______________________________________________________________ Vitals: BP (!) 104/55   Pulse 87   Temp 99.3 F (37.4 C)   Resp 11   Ht 5\' 9"  (1.753 m)   Wt 96.8 kg   SpO2 100%   BMI 31.51 kg/m   - Neuro: alert NAD.  Moving all extremities  - Cardiovascular: Sinus  Drips: neo 15.   CVP:  [2 mmHg-11 mmHg] 8 mmHg  - Pulm: clear, SS CT output  ABG    Component Value Date/Time   PHART 7.455 (H) 02/13/2020 0459   PCO2ART 33.9 02/13/2020 0459   PO2ART 161.0 (H) 02/13/2020 0459   HCO3 23.9 02/13/2020 0459   TCO2 25 02/13/2020 0459   ACIDBASEDEF 3.0 (H) 02/13/2020 0110   O2SAT 100.0 02/13/2020 0459    - Abd: soft - Extremity: trace edema  .Intake/Output      02/17 0701 - 02/18 0700 02/18 0701 - 02/19 0700   P.O.     I.V. (mL/kg) 3179.6 (32.8)    Blood 300    IV Piggyback 1239    Total Intake(mL/kg) 4718.5 (48.7)    Urine (mL/kg/hr) 3975 (1.7)    Blood 750    Chest Tube 320    Total Output 5045    Net -326.5            _______________________________________________________________ Labs: CBC Latest Ref Rng & Units 02/13/2020 02/13/2020 02/13/2020  WBC 4.0 - 10.5 K/uL - 14.8(H) -  Hemoglobin 13.0 - 17.0 g/dL 6.5(LL) 9.9(L) 10.5(L)  Hematocrit 39.0 - 52.0 % 19.0(L) 29.1(L) 31.0(L)  Platelets 150 - 400 K/uL - 131(L) -   CMP Latest Ref Rng & Units 02/13/2020 02/13/2020 02/13/2020  Glucose 70 - 99 mg/dL - 180(H) -  BUN 8 - 23 mg/dL - 12 -  Creatinine 0.61 - 1.24 mg/dL - 1.03 -  Sodium 135 - 145 mmol/L 141 139 142  Potassium  3.5 - 5.1 mmol/L 4.0 4.2 3.9  Chloride 98 - 111 mmol/L - 107 -  CO2 22 - 32 mmol/L - 22 -  Calcium 8.9 - 10.3 mg/dL - 8.8(L) -  Total Protein 6.5 - 8.1 g/dL - - -  Total Bilirubin 0.3 - 1.2 mg/dL - - -  Alkaline Phos 38 - 126 U/L - - -  AST 15 - 41 U/L - - -  ALT 0 - 44 U/L - - -    CXR: pending  _______________________________________________________________  Assessment and Plan: POD 1 s/p CABG4/CEA.  Doing well  Neuro: will improve pain control CV: wean neo.  Will increase BB as needed.  On asp, statin.  Will remove a line Pulm: CT to bulb.  pulm toilet and ambulation Renal: good uop. Will hold on diuresis GI: advance diet once off bipap Heme: stable ID: afebrile Endo: SSI  Dispo: continue ICU care.  Melodie Bouillon, MD 02/13/2020 8:38 AM

## 2020-02-13 NOTE — Telephone Encounter (Signed)
Patient has TOC appt on 02/27/20 with [name], requested by [place].

## 2020-02-13 NOTE — Progress Notes (Signed)
TCTS Evening Rounds  POD #1 s/p CABG/CEA Doing well Up and OOB Vitals stable  Intake/Output Summary (Last 24 hours) at 02/13/2020 1821 Last data filed at 02/13/2020 1200 Gross per 24 hour  Intake 1435.16 ml  Output 1380 ml  Net 55.16 ml    Exam unremarkable  A/p: continue present management. Jesus Francis Z. Orvan Seen, [place]

## 2020-02-14 ENCOUNTER — Encounter (HOSPITAL_COMMUNITY): Payer: Self-pay | Admitting: Thoracic Surgery (Cardiothoracic Vascular Surgery)

## 2020-02-14 ENCOUNTER — Inpatient Hospital Stay (HOSPITAL_COMMUNITY): Payer: 59

## 2020-02-14 LAB — GLUCOSE, CAPILLARY
Glucose-Capillary: 149 mg/dL — ABNORMAL HIGH (ref 70–99)
Glucose-Capillary: 154 mg/dL — ABNORMAL HIGH (ref 70–99)
Glucose-Capillary: 135 mg/dL — ABNORMAL HIGH (ref 70–99)
Glucose-Capillary: 114 mg/dL — ABNORMAL HIGH (ref 70–99)
Glucose-Capillary: 157 mg/dL — ABNORMAL HIGH (ref 70–99)

## 2020-02-14 LAB — CBC
HCT: 27.6 % — ABNORMAL LOW (ref 39.0–52.0)
Hemoglobin: 9.1 g/dL — ABNORMAL LOW (ref 13.0–17.0)
MCH: 31.7 pg (ref 26.0–34.0)
MCHC: 33 g/dL (ref 30.0–36.0)
MCV: 96.2 fL (ref 80.0–100.0)
Platelets: 128 10*3/uL — ABNORMAL LOW (ref 150–400)
RBC: 2.87 MIL/uL — ABNORMAL LOW (ref 4.22–5.81)
RDW: 12.4 % (ref 11.5–15.5)
WBC: 21.3 10*3/uL — ABNORMAL HIGH (ref 4.0–10.5)
nRBC: 0 % (ref 0.0–0.2)

## 2020-02-14 LAB — BASIC METABOLIC PANEL
Anion gap: 8 (ref 5–15)
BUN: 15 mg/dL (ref 8–23)
CO2: 27 mmol/L (ref 22–32)
Calcium: 8.7 mg/dL — ABNORMAL LOW (ref 8.9–10.3)
Chloride: 100 mmol/L (ref 98–111)
Creatinine, Ser: 0.91 mg/dL (ref 0.61–1.24)
GFR calc Af Amer: >60 mL/min (ref >60)
GFR calc non Af Amer: >60 mL/min (ref >60)
Glucose, Bld: 193 mg/dL — ABNORMAL HIGH (ref 70–99)
Potassium: 4.3 mmol/L (ref 3.5–5.1)
Sodium: 135 mmol/L (ref 135–145)

## 2020-02-14 MED ORDER — POTASSIUM CHLORIDE CRYS ER 20 MEQ PO TBCR
40.0000 meq | EXTENDED_RELEASE_TABLET | Freq: Once | ORAL | Status: AC
  Administered 2020-02-14: 40 meq via ORAL
  Filled 2020-02-14: qty 2

## 2020-02-14 MED ORDER — ~~LOC~~ CARDIAC SURGERY, PATIENT & FAMILY EDUCATION: Freq: Once | Status: AC

## 2020-02-14 MED ORDER — SODIUM CHLORIDE 0.9% FLUSH
3.0000 mL | Freq: Two times a day (BID) | INTRAVENOUS | Status: DC
  Administered 2020-02-15 – 2020-02-16 (×3): 3 mL via INTRAVENOUS

## 2020-02-14 MED ORDER — FUROSEMIDE 40 MG PO TABS
40.0000 mg | ORAL_TABLET | Freq: Every day | ORAL | Status: DC
  Administered 2020-02-14 – 2020-02-17 (×4): 40 mg via ORAL
  Filled 2020-02-14 (×4): qty 1

## 2020-02-14 MED ORDER — SODIUM CHLORIDE 0.9% FLUSH: 3.0000 mL | INTRAVENOUS | Status: DC | PRN

## 2020-02-14 MED ORDER — AMIODARONE LOAD VIA INFUSION
150.0000 mg | Freq: Once | INTRAVENOUS | Status: AC
  Administered 2020-02-14: 01:00:00 150 mg via INTRAVENOUS
  Filled 2020-02-14: qty 83.34

## 2020-02-14 MED ORDER — AMIODARONE HCL IN DEXTROSE 360-4.14 MG/200ML-% IV SOLN
60.0000 mg/h | INTRAVENOUS | Status: AC
  Administered 2020-02-14 (×2): 60 mg/h via INTRAVENOUS
  Filled 2020-02-14: qty 200

## 2020-02-14 MED ORDER — AMIODARONE HCL IN DEXTROSE 360-4.14 MG/200ML-% IV SOLN
30.0000 mg/h | INTRAVENOUS | Status: AC
  Administered 2020-02-14: 30 mg/h via INTRAVENOUS
  Filled 2020-02-14 (×3): qty 200

## 2020-02-14 MED ORDER — LIDOCAINE 5 % EX PTCH
2.0000 | MEDICATED_PATCH | CUTANEOUS | Status: DC
  Administered 2020-02-14 – 2020-02-16 (×2): 2 via TRANSDERMAL
  Filled 2020-02-14 (×6): qty 2

## 2020-02-14 MED ORDER — SODIUM CHLORIDE 0.9 % IV SOLN: 250.0000 mL | INTRAVENOUS | Status: DC | PRN

## 2020-02-14 MED FILL — Lidocaine HCl Local Preservative Free (PF) Inj 2%: INTRAMUSCULAR | Qty: 15 | Status: AC

## 2020-02-14 MED FILL — Potassium Chloride Inj 2 mEq/ML: INTRAVENOUS | Qty: 40 | Status: AC

## 2020-02-14 MED FILL — Heparin Sodium (Porcine) Inj 1000 Unit/ML: INTRAMUSCULAR | Qty: 30 | Status: AC

## 2020-02-14 NOTE — Progress Notes (Signed)
Bipap is PRN order, no distress noted at this time, RT will continue to monitor.

## 2020-02-14 NOTE — Discharge Instructions (Signed)
Discharge Instructions:  1. You may shower, please wash incisions daily with soap and water and keep dry.  If you wish to cover wounds with dressing you may do so but please keep clean and change daily.  No tub baths or swimming until incisions have completely healed.  If your incisions become red or develop any drainage please call our office at [phone]  2. No Driving until cleared by Dr. Abran Duke office and you are no longer using narcotic pain medications  3. Monitor your weight daily.. Please use the same scale and weigh at same time... If you gain 5-10 lbs in 48 hours with associated lower extremity swelling, please contact our office at [phone]  4. Fever of 101.5 for at least 24 hours with no source, please contact our office at [phone]  5. Activity- up as tolerated, please walk at least 3 times per day.  Avoid strenuous activity, no lifting, pushing, or pulling with your arms over 8-10 lbs for a minimum of 6 weeks  6. If any questions or concerns arise, please do not hesitate to contact our office at [phone]    [facility]  Discharge Instructions   Carotid Endarterectomy (CEA)  Please refer to the following instructions for your post-procedure care. Your surgeon or physician assistant will discuss any changes with you.  Activity  You are encouraged to walk as much as you can. You can slowly return to normal activities but must avoid strenuous activity and heavy lifting until your doctor tell you it's OK. Avoid activities such as vacuuming or swinging a golf club. You can drive after one week if you are comfortable and you are no longer taking prescription pain medications. It is normal to feel tired for serval weeks after your surgery. It is also normal to have difficulty with sleep habits, eating, and bowel movements after surgery. These will go away with time.  Bathing/Showering  You may shower after you come home. Do not  soak in a bathtub, hot tub, or swim until the incision heals completely.  Incision Care  Shower every day. Clean your incision with mild soap and water. Pat the area dry with a clean towel. You do not need a bandage unless otherwise instructed. Do not apply any ointments or creams to your incision. You may have skin glue on your incision. Do not peel it off. It will come off on its own in about one week. Your incision may feel thickened and raised for several weeks after your surgery. This is normal and the skin will soften over time. For Men Only: It's OK to shave around the incision but do not shave the incision itself for 2 weeks. It is common to have numbness under your chin that could last for several months.  Diet  Resume your normal diet. There are no special food restrictions following this procedure. A low fat/low cholesterol diet is recommended for all patients with vascular disease. In order to heal from your surgery, it is CRITICAL to get adequate nutrition. Your body requires vitamins, minerals, and protein. Vegetables are the best source of vitamins and minerals. Vegetables also provide the perfect balance of protein. Processed food has little nutritional value, so try to avoid this.        Medications  Resume taking all of your medications unless your doctor or physician assistant tells you not to. If your incision is causing pain, you may take over-the- counter pain relievers such as acetaminophen (Tylenol). If you  were prescribed a stronger pain medication, please be aware these medications can cause nausea and constipation. Prevent nausea by taking the medication with a snack or meal. Avoid constipation by drinking plenty of fluids and eating foods with a high amount of fiber, such as fruits, vegetables, and grains. Do not take Tylenol if you are taking prescription pain medications.  Follow Up  Our office will schedule a follow up appointment 2-3 weeks following  discharge.  Please call us immediately for any of the following conditions  Increased pain, redness, drainage (pus) from your incision site. Fever of 101 degrees or higher. If you should develop stroke (slurred speech, difficulty swallowing, weakness on one side of your body, loss of vision) you should call 911 and go to the nearest emergency room.  Reduce your risk of vascular disease:  Stop smoking. If you would like help call [facility] at 1-800-QUIT-NOW 407-613-5906) or [place] at ([phone]. Manage your cholesterol Maintain a desired weight Control your diabetes Keep your blood pressure down  If you have any questions, please call the office at [phone].

## 2020-02-14 NOTE — Progress Notes (Signed)
      [address]       [place] [address]             [phone]      POD # 2 CABG  Resting comfortably  BP 99/64   Pulse 65   Temp 97.6 F (36.4 C) (Oral)   Resp (!) 22   Ht 5\' 9"  (1.753 m)   Wt 94.6 kg   SpO2 99%   BMI 30.80 kg/m  2l [place]  Intake/Output Summary (Last 24 hours) at 02/14/2020 1658 Last data filed at 02/14/2020 1600 Gross per 24 hour  Intake 1565.78 ml  Output 1160 ml  Net 405.78 ml   CBG well controlled  Remo Lipps C. Roxan Hockey, MD [facility] [phone]

## 2020-02-14 NOTE — Progress Notes (Signed)
      [address]       [place] [address]             [phone]                 2 Days Post-Op Procedure(s) (LRB): CORONARY ARTERY BYPASS GRAFTING (CABG) times four, using left internal mammary artery and right greater saphenous vein harvested endoscopically. (N/A) TRANSESOPHAGEAL ECHOCARDIOGRAM (TEE) (N/A) ENDARTERECTOMY CAROTID (Left)   Events: afib overnight.  Started on amio.  Back in sinus Complains of chest wall pain _______________________________________________________________ Vitals: BP 94/60   Pulse 79   Temp 97.7 F (36.5 C) (Oral)   Resp (!) 0   Ht 5\' 9"  (1.753 m)   Wt 96.8 kg   SpO2 98%   BMI 31.51 kg/m   - Neuro: alert NAD.  Moving all extremities  - Cardiovascular: Sinus  Drips: amio     - Pulm: clear, SS CT output  ABG    Component Value Date/Time   PHART 7.455 (H) 02/13/2020 0459   PCO2ART 33.9 02/13/2020 0459   PO2ART 161.0 (H) 02/13/2020 0459   HCO3 23.9 02/13/2020 0459   TCO2 25 02/13/2020 0459   ACIDBASEDEF 3.0 (H) 02/13/2020 0110   O2SAT 100.0 02/13/2020 0459    - Abd: soft - Extremity: trace edema  .Intake/Output      02/18 0701 - 02/19 0700 02/19 0701 - 02/20 0700   P.O. 600    I.V. (mL/kg) 212.3 (2.2)    Blood     IV Piggyback 300    Total Intake(mL/kg) 1112.3 (11.5)    Urine (mL/kg/hr) 725 (0.3)    Blood     Chest Tube 120    Total Output 845    Net +267.3            _______________________________________________________________ Labs: CBC Latest Ref Rng & Units 02/14/2020 02/13/2020 02/13/2020  WBC 4.0 - 10.5 K/uL 21.3(H) 21.4(H) 17.2(H)  Hemoglobin 13.0 - 17.0 g/dL 9.1(L) 9.3(L) 9.1(L)  Hematocrit 39.0 - 52.0 % 27.6(L) 27.6(L) 27.3(L)  Platelets 150 - 400 K/uL 128(L) 133(L) 121(L)   CMP Latest Ref Rng & Units 02/14/2020 02/13/2020 02/13/2020  Glucose 70 - 99 mg/dL 193(H) 162(H) -  BUN 8 - 23 mg/dL 15 14 -  Creatinine 0.61 - 1.24 mg/dL 0.91 0.92 1.01  Sodium 135 - 145 mmol/L 135 138 -   Potassium 3.5 - 5.1 mmol/L 4.3 4.2 -  Chloride 98 - 111 mmol/L 100 105 -  CO2 22 - 32 mmol/L 27 26 -  Calcium 8.9 - 10.3 mg/dL 8.7(L) 8.8(L) -  Total Protein 6.5 - 8.1 g/dL - - -  Total Bilirubin 0.3 - 1.2 mg/dL - - -  Alkaline Phos 38 - 126 U/L - - -  AST 15 - 41 U/L - - -  ALT 0 - 44 U/L - - -    CXR: Clear.  PV congestion  _______________________________________________________________  Assessment and Plan: POD 2 s/p CABG4/CEA.  Doing well  Neuro: will improve pain control.  Will add lidocaine gtt CV: will remove CVL.  On amio for 24hrs.  Back in sinus, will convert to PO per protocol Pulm: CT out today.  Pulm toilet Renal: good uop. Will diuresis today GI: advance diet  Heme: stable ID: [email or web address] Endo: SSI  Dispo: floor today  Melodie Bouillon, MD 02/14/2020 7:43 AM

## 2020-02-14 NOTE — Progress Notes (Signed)
  Progress Note    02/14/2020 8:48 AM 2 Days Post-Op  Subjective:  Awake and alert sitting up in chair. Appears comfortable. Complaining of chest soreness. Has ambulated in room. Voided this morning   Vitals:   02/14/20 0800 02/14/20 0832  BP: 101/60   Pulse: 72   Resp: 18   Temp:  98 F (36.7 C)  SpO2: 92%    Physical Exam: General: Not in any acute distress Lungs:  Non labored Incisions:  Left neck incision, clean, dry, intact. No swelling, No ecchymosis Extremities:  Normal motor and strength Neurologic: CN intact, tongue midline, face symmetrical without grooping, not upper extremity weakness or numbness, speech clear and coherent  CBC    Component Value Date/Time   WBC 21.3 (H) 02/14/2020 0325   RBC 2.87 (L) 02/14/2020 0325   HGB 9.1 (L) 02/14/2020 0325   HGB 15.9 02/05/2020 1433   HCT 27.6 (L) 02/14/2020 0325   HCT 46.9 02/05/2020 1433   PLT 128 (L) 02/14/2020 0325   PLT 247 02/05/2020 1433   MCV 96.2 02/14/2020 0325   MCV 91 02/05/2020 1433   MCH 31.7 02/14/2020 0325   MCHC 33.0 02/14/2020 0325   RDW 12.4 02/14/2020 0325   RDW 11.9 02/05/2020 1433    BMET    Component Value Date/Time   NA 135 02/14/2020 0325   NA 142 02/05/2020 1433   K 4.3 02/14/2020 0325   CL 100 02/14/2020 0325   CO2 27 02/14/2020 0325   GLUCOSE 193 (H) 02/14/2020 0325   BUN 15 02/14/2020 0325   BUN 15 02/05/2020 1433   CREATININE 0.91 02/14/2020 0325   CALCIUM 8.7 (L) 02/14/2020 0325   GFRNONAA >60 02/14/2020 0325   GFRAA >60 02/14/2020 0325    INR    Component Value Date/Time   INR 1.4 (H) 02/12/2020 1550     Intake/Output Summary (Last 24 hours) at 02/14/2020 0848 Last data filed at 02/14/2020 0800 Gross per 24 hour  Intake 1752.9 ml  Output 660 ml  Net 1092.9 ml     Assessment/Plan:  64 y.o. male is s/p left carotid endartectomy and CABG4 2 Days Post-Op. Patient with no new neurological deficits. Doing well post op Carotid. CT surgery continuing to follow  patient. Dr. Donnetta Hutching ill see patient for follow up in the office in 3 weeks  DVT prophylaxis: SQ Lovenox   Karoline Caldwell, PA-C [facility] [phone] 02/14/2020 8:48 AM

## 2020-02-15 ENCOUNTER — Inpatient Hospital Stay (HOSPITAL_COMMUNITY): Payer: 59

## 2020-02-15 LAB — TYPE AND SCREEN
ABO/RH(D): O POS
Antibody Screen: NEGATIVE
Unit division: 0
Unit division: 0

## 2020-02-15 LAB — GLUCOSE, CAPILLARY
Glucose-Capillary: 108 mg/dL — ABNORMAL HIGH (ref 70–99)
Glucose-Capillary: 109 mg/dL — ABNORMAL HIGH (ref 70–99)
Glucose-Capillary: 121 mg/dL — ABNORMAL HIGH (ref 70–99)
Glucose-Capillary: 132 mg/dL — ABNORMAL HIGH (ref 70–99)
Glucose-Capillary: 144 mg/dL — ABNORMAL HIGH (ref 70–99)
Glucose-Capillary: 95 mg/dL (ref 70–99)

## 2020-02-15 LAB — BPAM RBC
Blood Product Expiration Date: 202103112359
Blood Product Expiration Date: 202103112359
Unit Type and Rh: 5100
Unit Type and Rh: 5100

## 2020-02-15 LAB — CBC
HCT: 29.7 % — ABNORMAL LOW (ref 39.0–52.0)
Hemoglobin: 9.7 g/dL — ABNORMAL LOW (ref 13.0–17.0)
MCH: 30.6 pg (ref 26.0–34.0)
MCHC: 32.7 g/dL (ref 30.0–36.0)
MCV: 93.7 fL (ref 80.0–100.0)
Platelets: 160 10*3/uL (ref 150–400)
RBC: 3.17 MIL/uL — ABNORMAL LOW (ref 4.22–5.81)
RDW: 12.3 % (ref 11.5–15.5)
WBC: 17.7 10*3/uL — ABNORMAL HIGH (ref 4.0–10.5)
nRBC: 0 % (ref 0.0–0.2)

## 2020-02-15 LAB — BASIC METABOLIC PANEL
Anion gap: 10 (ref 5–15)
BUN: 16 mg/dL (ref 8–23)
CO2: 26 mmol/L (ref 22–32)
Calcium: 8.9 mg/dL (ref 8.9–10.3)
Chloride: 99 mmol/L (ref 98–111)
Creatinine, Ser: 0.96 mg/dL (ref 0.61–1.24)
GFR calc Af Amer: >60 mL/min (ref >60)
GFR calc non Af Amer: >60 mL/min (ref >60)
Glucose, Bld: 149 mg/dL — ABNORMAL HIGH (ref 70–99)
Potassium: 4 mmol/L (ref 3.5–5.1)
Sodium: 135 mmol/L (ref 135–145)

## 2020-02-15 MED ORDER — AMIODARONE HCL 200 MG PO TABS
400.0000 mg | ORAL_TABLET | Freq: Two times a day (BID) | ORAL | Status: DC
  Administered 2020-02-15 – 2020-02-17 (×5): 400 mg via ORAL
  Filled 2020-02-15 (×5): qty 2

## 2020-02-15 MED ORDER — INSULIN ASPART 100 UNIT/ML ~~LOC~~ SOLN
0.0000 [IU] | Freq: Three times a day (TID) | SUBCUTANEOUS | Status: DC
  Administered 2020-02-15 – 2020-02-16 (×2): 2 [IU] via SUBCUTANEOUS

## 2020-02-15 MED ORDER — ZOLPIDEM TARTRATE 5 MG PO TABS: 5.0000 mg | ORAL_TABLET | Freq: Every evening | ORAL | Status: DC | PRN

## 2020-02-15 NOTE — Progress Notes (Signed)
CARDIAC REHAB PHASE I   PRE:  Rate/Rhythm: 65 SR  BP:  Supine: 133/77  Sitting:   Standing:    SaO2: 100 L  MODE:  Ambulation: 370 ft   POST:  Rate/Rhythm: 92 Sr  BP:  Supine:   Sitting: 143/75  Standing:    SaO2: 100 RA 1420-1455  On arrival pt in bed states that he has not walked today. Assisted X 1 and used walker to ambualte. Gait steady with walker. Pt walked 370 feet without c/o. VS stable. Pt states that he quits breathing, "I just forget to breathe and the monitor starts alarming." His oxygen saturation after walking was 100% on room air,  but he insist that he needs the oxygen especially if he dozes off. Oxygen replaced on at 1 liter nasal cannula. Pt to recliner after walk with call light in reach and wife present. I encouraged more walks today and using IS hourly.  Rodney Langton RN 02/15/2020 2:48 PM

## 2020-02-15 NOTE — Progress Notes (Signed)
3 Days Post-Op Procedure(s) (LRB): CORONARY ARTERY BYPASS GRAFTING (CABG) times four, using left internal mammary artery and right greater saphenous vein harvested endoscopically. (N/A) TRANSESOPHAGEAL ECHOCARDIOGRAM (TEE) (N/A) ENDARTERECTOMY CAROTID (Left) Subjective: Didn't sleep well last night  Objective: Vital signs in last 24 hours: Temp:  [97.5 F (36.4 C)-98.1 F (36.7 C)] 98 F (36.7 C) (02/20 0737) Pulse Rate:  [59-82] 69 (02/20 0700) Cardiac Rhythm: Normal sinus rhythm (02/20 0400) Resp:  [8-25] 20 (02/20 0700) BP: (92-125)/(54-79) 113/73 (02/20 0700) SpO2:  [92 %-100 %] 99 % (02/20 0700)  Hemodynamic parameters for last 24 hours:    Intake/Output from previous day: 02/19 0701 - 02/20 0700 In: 1388.8 [I.V.:1388.8] Out: 1325 [Urine:1325] Intake/Output this shift: No intake/output data recorded.  General appearance: alert, cooperative and no distress Neurologic: intact Heart: regular rate and rhythm Lungs: clear to auscultation bilaterally Abdomen: normal findings: soft, non-tender  Lab Results: Recent Labs    02/14/20 0325 02/15/20 0802  WBC 21.3* 17.7*  HGB 9.1* 9.7*  HCT 27.6* 29.7*  PLT 128* 160   BMET:  Recent Labs    02/13/20 1708 02/14/20 0325  NA 138 135  K 4.2 4.3  CL 105 100  CO2 26 27  GLUCOSE 162* 193*  BUN 14 15  CREATININE 0.92 0.91  CALCIUM 8.8* 8.7*    PT/INR:  Recent Labs    02/12/20 1550  LABPROT 17.2*  INR 1.4*   ABG    Component Value Date/Time   PHART 7.455 (H) 02/13/2020 0459   HCO3 23.9 02/13/2020 0459   TCO2 25 02/13/2020 0459   ACIDBASEDEF 3.0 (H) 02/13/2020 0110   O2SAT 100.0 02/13/2020 0459   CBG (last 3)  Recent Labs    02/15/20 0000 02/15/20 0417 02/15/20 0735  GLUCAP 108* 121* 132*    Assessment/Plan: S/P Procedure(s) (LRB): CORONARY ARTERY BYPASS GRAFTING (CABG) times four, using left internal mammary artery and right greater saphenous vein harvested endoscopically. (N/A) TRANSESOPHAGEAL  ECHOCARDIOGRAM (TEE) (N/A) ENDARTERECTOMY CAROTID (Left) Plan for transfer to step-down: see transfer orders  CV- in SR on metoprolol and amiodarone  Change Amiodarone to PO RESP- continue IS RENAL- creatinine and lytes OK ENDO- CBG well controlled- change to [facility] and HS Continue cardiac rehab   LOS: 4 days    Jesus Francis 02/15/2020

## 2020-02-15 NOTE — Progress Notes (Signed)
Patient to room 4E06 from [facility]. VSS. On monitor CCMD notified. CHG bath completed.  Paulene Floor, RN

## 2020-02-15 NOTE — Plan of Care (Signed)
  Problem: Education: Goal: Will demonstrate proper wound care and an understanding of methods to prevent future damage Outcome: Progressing   Problem: Cardiac: Goal: Will achieve and/or maintain hemodynamic stability Outcome: Progressing   Problem: Respiratory: Goal: Respiratory status will improve Outcome: Progressing   Problem: Urinary Elimination: Goal: Ability to achieve and maintain adequate renal perfusion and functioning will improve Outcome: Progressing

## 2020-02-16 LAB — BASIC METABOLIC PANEL
Anion gap: 10 (ref 5–15)
BUN: 16 mg/dL (ref 8–23)
CO2: 28 mmol/L (ref 22–32)
Calcium: 9.2 mg/dL (ref 8.9–10.3)
Chloride: 101 mmol/L (ref 98–111)
Creatinine, Ser: 1.07 mg/dL (ref 0.61–1.24)
GFR calc Af Amer: >60 mL/min (ref >60)
GFR calc non Af Amer: >60 mL/min (ref >60)
Glucose, Bld: 112 mg/dL — ABNORMAL HIGH (ref 70–99)
Potassium: 3.9 mmol/L (ref 3.5–5.1)
Sodium: 139 mmol/L (ref 135–145)

## 2020-02-16 LAB — CBC
HCT: 31 % — ABNORMAL LOW (ref 39.0–52.0)
Hemoglobin: 10.3 g/dL — ABNORMAL LOW (ref 13.0–17.0)
MCH: 31.1 pg (ref 26.0–34.0)
MCHC: 33.2 g/dL (ref 30.0–36.0)
MCV: 93.7 fL (ref 80.0–100.0)
Platelets: 185 10*3/uL (ref 150–400)
RBC: 3.31 MIL/uL — ABNORMAL LOW (ref 4.22–5.81)
RDW: 12.3 % (ref 11.5–15.5)
WBC: 14.7 10*3/uL — ABNORMAL HIGH (ref 4.0–10.5)
nRBC: 0.1 % (ref 0.0–0.2)

## 2020-02-16 LAB — GLUCOSE, CAPILLARY
Glucose-Capillary: 103 mg/dL — ABNORMAL HIGH (ref 70–99)
Glucose-Capillary: 118 mg/dL — ABNORMAL HIGH (ref 70–99)
Glucose-Capillary: 134 mg/dL — ABNORMAL HIGH (ref 70–99)

## 2020-02-16 NOTE — Progress Notes (Signed)
Patient resting comfortably on nasal cannula at this time.  Bipap ordered for PRN.  Currently not indicated.  Will continue to monitor and assess for bipap needs.

## 2020-02-16 NOTE — Progress Notes (Signed)
When Pt's on room air at night, I observed that Pt snored heavily, SPO2 dropped to 65-89%, but unsustained. Mostly his SPO2 > 96-100% on 2 LPM of NCL. Marland Kitchen Report given to RN day shift and Tessa PA, made aware. Will continue to monitor.   Kennyth Lose, RN

## 2020-02-16 NOTE — Progress Notes (Signed)
Patient ambulated in hallway with nursing staff and rolling walker on Room air. 470 feet. Back in room will monitor patient. Aveena Bari, Bettina Gavia RN

## 2020-02-16 NOTE — Progress Notes (Signed)
Patient ambulated in hallway with nursing staff and rolling walker on room air 444feet. Patient tolerated well, had a brisk pace during this walk. Patient states "he is tired today" ambulated back to room and assisted patient to bed. Spouse at bedside. Call bell within reach. Will monitor patient Lun Muro, Bettina Gavia RN

## 2020-02-16 NOTE — Progress Notes (Addendum)
Plan of care reviewed. Pt is progressing. Remained afebrile, vital signs stable,   Normal sinus rhythm on monitor, HR 70s-80s, no ectopic beats,  BP 123/70- 138/74 mmHg,  RR 19-23,  SPO2 99-100% on room air.  Temp 98.4 F  Pt's alert and oriented x 4 ,quite anxious and agitated about beeping sound of heart monitor.  Stated he couldn't sleep. Pt refused Ambient at bedtime, preferred to sleep without unnecessary interruptions. Reassured and emotional support given.   Pain tolerated well with Tylenol scheduled q 6 hrs. Surgical wounds stayed dry and clean, no drainage, sternal wound, left carotid and right leg EVH cleaned with Betadine.   No immediate distress noted. We will continue to monitor.  Dallie Patton.RN

## 2020-02-16 NOTE — Progress Notes (Signed)
Patient ambulated in hallway 470 feet with rolling walker on room air. Patients 02 saturation ranged from 92%-100 % on room air. Back in room to chair call bell within reach. Shandy Vi, Bettina Gavia RN

## 2020-02-16 NOTE — Progress Notes (Addendum)
      [address]       [place] [address]             [phone]      4 Days Post-Op Procedure(s) (LRB): CORONARY ARTERY BYPASS GRAFTING (CABG) times four, using left internal mammary artery and right greater saphenous vein harvested endoscopically. (N/A) TRANSESOPHAGEAL ECHOCARDIOGRAM (TEE) (N/A) ENDARTERECTOMY CAROTID (Left) Subjective: Biggest issue is not getting any sleep while in the hospital.   Objective: Vital signs in last 24 hours: Temp:  [97.6 F (36.4 C)-98.4 F (36.9 C)] 97.6 F (36.4 C) (02/21 0322) Pulse Rate:  [64-94] 80 (02/21 0322) Cardiac Rhythm: Normal sinus rhythm (02/21 0332) Resp:  [11-19] 18 (02/21 0322) BP: (115-155)/(67-83) 135/75 (02/21 0322) SpO2:  [96 %-100 %] 100 % (02/21 0322) FiO2 (%):  [2 %] 2 % (02/20 2010) Weight:  [92.9 kg] 92.9 kg (02/21 0302)    Intake/Output from previous day: 02/20 0701 - 02/21 0700 In: 756.7 [P.O.:740; I.V.:16.7] Out: 250 [Urine:250] Intake/Output this shift: No intake/output data recorded.  General appearance: alert, cooperative and no distress Heart: regular rate and rhythm, S1, S2 normal, no murmur, click, rub or gallop Lungs: clear to auscultation bilaterally Abdomen: soft, non-tender; bowel sounds normal; no masses,  no organomegaly Extremities: extremities normal, atraumatic, no cyanosis or edema Wound: clean and dry  Lab Results: Recent Labs    02/15/20 0802 02/16/20 0248  WBC 17.7* 14.7*  HGB 9.7* 10.3*  HCT 29.7* 31.0*  PLT 160 185   BMET:  Recent Labs    02/15/20 0802 02/16/20 0248  NA 135 139  K 4.0 3.9  CL 99 101  CO2 26 28  GLUCOSE 149* 112*  BUN 16 16  CREATININE 0.96 1.07  CALCIUM 8.9 9.2    PT/INR: No results for input(s): LABPROT, INR in the last 72 hours. ABG    Component Value Date/Time   PHART 7.455 (H) 02/13/2020 0459   HCO3 23.9 02/13/2020 0459   TCO2 25 02/13/2020 0459   ACIDBASEDEF 3.0 (H) 02/13/2020 0110   O2SAT 100.0 02/13/2020 0459    CBG (last 3)  Recent Labs    02/15/20 1658 02/15/20 2114 02/16/20 0623  GLUCAP 144* 95 103*    Assessment/Plan: S/P Procedure(s) (LRB): CORONARY ARTERY BYPASS GRAFTING (CABG) times four, using left internal mammary artery and right greater saphenous vein harvested endoscopically. (N/A) TRANSESOPHAGEAL ECHOCARDIOGRAM (TEE) (N/A) ENDARTERECTOMY CAROTID (Left)  1. CV-NSR in the 70s, BP well controlled. Continue ASA, Zetia, Amio, and Lopressor. 2. Pulm-tolerating room air with excellent saturation 3. Renal-creatinine 1.07, electrolytes okay.  4. H and H 10.3/31.0 5. Endo-blood glucose is well controlled and patient is not a diabetic. Will discontinue CBG checks 6. WBC slightly elevated. Encouraged incentive spirometer.   Plan: Discharge planned for tomorrow. Needing oxygen at night due to what sounds like some sleep apnea. I doubt insurance will approve this since he is 100% on RA when awake but will attempt. He will need close follow-up with Dr. Claiborne Billings.    LOS: 5 days    Elgie Collard 02/16/2020 Patient seen and examined, agree with above  Remo Lipps C. Roxan Hockey, MD [facility] [phone]

## 2020-02-17 MED ORDER — POTASSIUM CHLORIDE ER 20 MEQ PO TBCR: 20.0000 meq | EXTENDED_RELEASE_TABLET | Freq: Every day | ORAL | 1 refills | Status: DC

## 2020-02-17 MED ORDER — METOPROLOL TARTRATE 25 MG/10 ML ORAL SUSPENSION
25.0000 mg | Freq: Two times a day (BID) | ORAL | Status: DC
  Filled 2020-02-17 (×2): qty 10

## 2020-02-17 MED ORDER — FUROSEMIDE 40 MG PO TABS: 40.0000 mg | ORAL_TABLET | Freq: Every day | ORAL | 1 refills | Status: DC

## 2020-02-17 MED ORDER — ACETAMINOPHEN 500 MG PO TABS: 1000.0000 mg | ORAL_TABLET | Freq: Four times a day (QID) | ORAL | 0 refills | Status: DC | PRN

## 2020-02-17 MED ORDER — TRAMADOL HCL 50 MG PO TABS: 50.0000 mg | ORAL_TABLET | ORAL | 0 refills | Status: DC | PRN

## 2020-02-17 MED ORDER — AMIODARONE HCL 400 MG PO TABS: 400.0000 mg | ORAL_TABLET | Freq: Two times a day (BID) | ORAL | 1 refills | Status: DC

## 2020-02-17 MED ORDER — METOPROLOL TARTRATE 25 MG PO TABS
25.0000 mg | ORAL_TABLET | Freq: Two times a day (BID) | ORAL | Status: DC
  Administered 2020-02-17: 25 mg via ORAL
  Filled 2020-02-17: qty 1

## 2020-02-17 MED FILL — Sodium Bicarbonate IV Soln 8.4%: INTRAVENOUS | Qty: 50 | Status: AC

## 2020-02-17 MED FILL — Mannitol IV Soln 20%: INTRAVENOUS | Qty: 500 | Status: AC

## 2020-02-17 MED FILL — Albumin, Human Inj 5%: INTRAVENOUS | Qty: 250 | Status: AC

## 2020-02-17 MED FILL — Calcium Chloride Inj 10%: INTRAVENOUS | Qty: 10 | Status: AC

## 2020-02-17 MED FILL — Heparin Sodium (Porcine) Inj 1000 Unit/ML: INTRAMUSCULAR | Qty: 10 | Status: AC

## 2020-02-17 MED FILL — Electrolyte-R (PH 7.4) Solution: INTRAVENOUS | Qty: 5000 | Status: AC

## 2020-02-17 NOTE — TOC Transition Note (Signed)
Transition of Care [facility]) - CM/SW Discharge Note Marvetta Gibbons RN, BSN Transitions of Care Unit 4E- RN Case Manager [phone]   Patient Details  Name: Jesus Francis MRN: [id number] Date of Birth: [birth year]  Transition of Care [facility]) CM/SW Contact:  Dawayne Patricia, RN Phone Number: 02/17/2020, 12:14 PM   Clinical Narrative:    Pt stable for transition home today, order placed for home 02-nighttime use only- spoke with Thedore Mins at [employer] to review. Per Thedore Mins pt does not meet for insurance to cover home 02 for night time use- CM spoke with pt and wife at bedside to explain - per pt plan is to have a sleep study done once he is cleared to do so after his post op follow up. He reports the doctors feel he may need a cpap. He asking how much home 02 cost out of pocket- Zach with [employer] to run the cost and come up to speak with pt at the beside regarding private pay cost.    Final next level of care: Home/Self Care Barriers to Discharge: No Barriers Identified   Patient Goals and CMS Choice Patient states their goals for this hospitalization and ongoing recovery are:: return home and recover CMS Medicare.gov Compare Post Acute Care list provided to:: Patient    Discharge Placement               Home        Discharge Plan and Services   Discharge Planning Services: CM Consult Post Acute Care Choice: Durable Medical Equipment          DME Arranged: Oxygen DME Agency: [employer] Date DME Agency Contacted: 02/17/20 Time DME Agency Contacted: 1000 Representative spoke with at DME Agency: Thedore Mins HH Arranged: NA [place] Agency: NA        Social Determinants of Health ([place]) Interventions     Readmission Risk Interventions Readmission Risk Prevention Plan 02/17/2020  Post Dischage Appt Complete  Medication Screening Complete  Transportation Screening Complete  Some recent data might be hidden

## 2020-02-17 NOTE — Progress Notes (Signed)
[id number] Pt has walker at home if needed. Discussed sternal precautions, staying in the tube, IS, walking for ex, wound care, gave heart healthy diet and discussed CRP 2. Will refer to [place] program. Gave IS as pt's had broken. Voiced understanding of ed. Pt is interested in participating in Virtual Cardiac and Pulmonary Rehab. Pt advised that Virtual Cardiac and Pulmonary Rehab is provided at no cost to the patient.  Checklist:  1. Pt has smart device  ie smartphone and/or ipad for downloading an app  Yes 2. Reliable internet/wifi service    Yes 3. Understands how to use their smartphone and navigate within an app.  Yes   Pt verbalized understanding and is in agreement.

## 2020-02-17 NOTE — Progress Notes (Signed)
   02/17/20 1300  Mobility  Activity Ambulated in room  Range of Motion Active;All extremities  Level of Assistance Independent  Assistive Device None  Mobility Response Tolerated well

## 2020-02-17 NOTE — Progress Notes (Signed)
      [address]       [place] [address]             [phone]      5 Days Post-Op Procedure(s) (LRB): CORONARY ARTERY BYPASS GRAFTING (CABG) times four, using left internal mammary artery and right greater saphenous vein harvested endoscopically. (N/A) TRANSESOPHAGEAL ECHOCARDIOGRAM (TEE) (N/A) ENDARTERECTOMY CAROTID (Left)   Subjective:  Patient states he notices his neck seems a little more swollen this morning.  He took some Tylenol which he thinks helped.  He otherwise states he has been told he is doing well.  He is ambulating + BM  Objective: Vital signs in last 24 hours: Temp:  [97.8 F (36.6 C)-98.9 F (37.2 C)] 98.3 F (36.8 C) (02/22 0459) Pulse Rate:  [79-94] 94 (02/22 0459) Cardiac Rhythm: Normal sinus rhythm (02/21 2001) Resp:  [16-24] 16 (02/22 0459) BP: (113-149)/(77-91) 149/91 (02/22 0459) SpO2:  [94 %-100 %] 100 % (02/22 0459) Weight:  [90.5 kg] 90.5 kg (02/22 0459)  Intake/Output from previous day: 02/21 0701 - 02/22 0700 In: 460 [P.O.:460] Out: -   General appearance: alert, cooperative and no distress Heart: regular rate and rhythm Lungs: clear to auscultation bilaterally Abdomen: soft, non-tender; bowel sounds normal; no masses,  no organomegaly Extremities: edema trace, ecchymosis RLE Wound: clean and dry incision sites, mild swelling along neck incision  Lab Results: Recent Labs    02/15/20 0802 02/16/20 0248  WBC 17.7* 14.7*  HGB 9.7* 10.3*  HCT 29.7* 31.0*  PLT 160 185   BMET:  Recent Labs    02/15/20 0802 02/16/20 0248  NA 135 139  K 4.0 3.9  CL 99 101  CO2 26 28  GLUCOSE 149* 112*  BUN 16 16  CREATININE 0.96 1.07  CALCIUM 8.9 9.2    PT/INR: No results for input(s): LABPROT, INR in the last 72 hours. ABG    Component Value Date/Time   PHART 7.455 (H) 02/13/2020 0459   HCO3 23.9 02/13/2020 0459   TCO2 25 02/13/2020 0459   ACIDBASEDEF 3.0 (H) 02/13/2020 0110   O2SAT 100.0 02/13/2020 0459   CBG  (last 3)  Recent Labs    02/16/20 0623 02/16/20 1110 02/16/20 1612  GLUCAP 103* 118* 134*    Assessment/Plan: S/P Procedure(s) (LRB): CORONARY ARTERY BYPASS GRAFTING (CABG) times four, using left internal mammary artery and right greater saphenous vein harvested endoscopically. (N/A) TRANSESOPHAGEAL ECHOCARDIOGRAM (TEE) (N/A) ENDARTERECTOMY CAROTID (Left)  1. CV- NSR, + HTN- will increase Lopressor to 25 mg BID, continue Amiodarone for previous PAF 2. Pulm- no acute issues, continue IS 3. Renal- creatinine has been stable, his weight is mildly elevated, continue Lasix 4. Mild left neck swelling at Carotid site, I think this is ok, there may be a small hematoma present, vascular surgery will check on wound as this is the first day I have seen the patient 5. Dispo- patient stable, ambulating independently, no further A. Fib, increase BB for better BP control, will likely d/c home today   LOS: 6 days    Ellwood Handler, PA-C  02/17/2020

## 2020-02-17 NOTE — Progress Notes (Signed)
Patient ID: Jesus Francis, male   DOB: 19-Aug-1956, 64 y.o.   MRN: [id number] Comfortable.  For discharge to home today.  Left neck incision healing nicely.  Typical healing ridge.  Neurologically intact.  I will see him in the office and 3 to 4 weeks for wound follow-up.

## 2020-02-18 ENCOUNTER — Encounter (HOSPITAL_COMMUNITY): Payer: Self-pay | Admitting: *Deleted

## 2020-02-18 NOTE — Progress Notes (Signed)
Referral received and verified for MD signature.  Follow up appt is 2/26 CV surgical; 3/4 with Cardiology and 3/9 Vascular surgery.  Insurance benefits and eligibility to be determined upon the satisfactory completion of all follow up appt. Cherre Huger, BSN Cardiac and Training and development officer

## 2020-02-18 NOTE — Telephone Encounter (Signed)
Patient contacted regarding discharge from [facility] on 2/22.  Patient understands to follow up with provider [name], PA on 3/4 at 3:15 at [facility]. Patient understands discharge instructions? Yes Patient understands medications and regiment? Yes Patient understands to bring all medications to this visit? Yes  Patient did have questions concerning his Valsartan, I advised patient that they stopped it at discharge, but any further discussion on restarting could come from the upcoming visit.  Patient verbalized understanding.

## 2020-02-20 ENCOUNTER — Telehealth (HOSPITAL_COMMUNITY): Payer: Self-pay

## 2020-02-20 NOTE — Telephone Encounter (Signed)
Pt insurance is active and benefits verified through [place] 0, DED $2,000/$2,000 met, out of pocket $4,000/$4,000 met, co-insurance 20%. no pre-authorization required. Passport, 02/20/2020'@1'$ :40pm, REF# [id number]  Will contact patient to see if he is interested in the Cardiac Rehab Program. If interested, patient will need to complete follow up appt. Once completed, patient will be contacted for scheduling upon review by the RN Navigator.

## 2020-02-21 ENCOUNTER — Ambulatory Visit (INDEPENDENT_AMBULATORY_CARE_PROVIDER_SITE_OTHER): Payer: Self-pay | Admitting: Thoracic Surgery (Cardiothoracic Vascular Surgery)

## 2020-02-21 ENCOUNTER — Encounter: Payer: Self-pay | Admitting: Thoracic Surgery (Cardiothoracic Vascular Surgery)

## 2020-02-21 ENCOUNTER — Other Ambulatory Visit: Payer: Self-pay

## 2020-02-21 VITALS — BP 130/81 | HR 85 | Temp 97.7°F | Resp 20 | Ht 69.0 in | Wt 201.5 lb

## 2020-02-21 DIAGNOSIS — Z951 Presence of aortocoronary bypass graft: Secondary | ICD-10-CM

## 2020-02-21 NOTE — Progress Notes (Signed)
      [address]       [place], [address]             [phone]        Bary M Eisenhardt [place] Medical Record [id number] Date of Birth: [birth year]  Referring: Antony Contras, MD Primary Care: Antony Contras, MD Primary Cardiologist:No primary care provider on file.  Reason for visit:   follow-up  History of Present Illness:     Mr. Crute presents for his first follow-up appointment following CABG/CEA.  He says that he is doing well and he mostly complains of right leg pain.  Physical Exam: BP 130/81 (BP Location: Right Arm, Patient Position: Sitting, Cuff Size: Normal)   Pulse 85   Temp 97.7 F (36.5 C) (Temporal)   Resp 20   Ht 5\' 9"  (1.753 m)   Wt 201 lb 8 oz (91.4 kg)   SpO2 96% Comment: RA  BMI 29.76 kg/m   Alert NAD Incision clean.  Sternum stable Regular rate and rhythm Abdomen soft, ND Trace peripheral edema       Assessment / Plan:   64 year old male status post CABG/CEA.  Doing well Follow-up 1 month with a chest x-ray.   Lajuana Matte 02/21/2020 2:13 PM

## 2020-02-24 NOTE — Progress Notes (Signed)
Cardiology Office Note   Date:  02/28/2020   ID:  Senica, Nilo 24-Mar-1956, MRN [id number]  PCP:  Antony Contras, MD  Cardiologist:  Shelva Majestic, MD EP: None  Chief Complaint  Patient presents with  . Hospitalization Follow-up    CAD s/p recent CABG      History of Present Illness: JOSGAR SIXKILLER is a 64 y.o. male with PMH of CAD s/p CABG 02/12/20, HTN, HLD, Carotid artery disease s/p L CEA 02/12/20, who presents for post-hospital follow-up.  He was evaluated by Dr. Claiborne Billings outpatient 02/05/20 in follow-up of recent cardiac work-up to evaluate exertional chest pain. He had a coronary CTA which revealed a calcium score of 2195 placing him in the 98th percentile for age/sex, and he was noted to have severe pRCA and mLAD lesions, and significant stenosis in the LCx. Echo showed EF 60-65%, no RWMA, and mild AI. Decision made to pursue a LHC which occurred 02/11/20 and revealed severe multivessel CAD of the LAD and RCA, with moderate ramus lesions. He was recommended for admission and TCTS evaluate for possible CABG. His pre-CABG work-up revealed severe left ICA stenosis. He underwent left CEA and CABG with LIMA to LAD, SVG-PDA, SVG-OM1, and SVG-diagonal on 02/12/20. His post op course was complicated by atrial fibrillation with RVR with conversion to NSR with amiodarone. He was discharged home 02/17/20 in stable condition.  He presents today for post-hospital follow-up after recent CABG. He has done well since discharge. He has worked up to two 15 minute walks daily. He is eating a healthier diet and has lost 10lbs since he was last seen in our office. He still has some mild RLE edema from vein graft harvest. He has not had any chest pain, SOB, DOE, palpitations, orthopnea, PND, dizziness, lightheadedness, or syncope. He reports sleeping well though notes he had trouble with apneic episodes during his recent hospitalization and he snored prior to his CABG, though he thinks this may have  gotten better since discharge. He has never had a sleep study before.    History reviewed. No pertinent past medical history.  Past Surgical History:  Procedure Laterality Date  . CORONARY ARTERY BYPASS GRAFT N/A 02/12/2020   Procedure: CORONARY ARTERY BYPASS GRAFTING (CABG) times four, using left internal mammary artery and right greater saphenous vein harvested endoscopically.;  Surgeon: Lajuana Matte, MD;  Location: [facility];  Service: Open Heart Surgery;  Laterality: N/A;  . ENDARTERECTOMY Left 02/12/2020   Procedure: ENDARTERECTOMY CAROTID;  Surgeon: Rosetta Posner, MD;  Location: [facility];  Service: Vascular;  Laterality: Left;  . LEFT HEART CATH AND CORONARY ANGIOGRAPHY N/A 02/11/2020   Procedure: LEFT HEART CATH AND CORONARY ANGIOGRAPHY;  Surgeon: Troy Sine, MD;  Location: [facility];  Service: Cardiovascular;  Laterality: N/A;  . TEE WITHOUT CARDIOVERSION N/A 02/12/2020   Procedure: TRANSESOPHAGEAL ECHOCARDIOGRAM (TEE);  Surgeon: Lajuana Matte, MD;  Location: [facility];  Service: Open Heart Surgery;  Laterality: N/A;     Current Outpatient Medications  Medication Sig Dispense Refill  . acetaminophen (TYLENOL) 500 MG tablet Take 2 tablets (1,000 mg total) by mouth every 6 (six) hours as needed. 30 tablet 0  . amiodarone (PACERONE) 200 MG tablet Take 1 tablet (200 mg total) by mouth daily. 90 tablet 3  . aspirin EC 81 MG tablet Take 81 mg by mouth at bedtime.     Marland Kitchen ezetimibe (ZETIA) 10 MG tablet Take 1 tablet (10 mg total) by mouth  daily. (Patient taking differently: Take 10 mg by mouth every evening. ) 90 tablet 3  . furosemide (LASIX) 40 MG tablet Take 1 tablet as needed for weight gain of 3 lbs in a day or 5 lbs in a week 30 tablet 3  . metoprolol succinate (TOPROL-XL) 50 MG 24 hr tablet Take 1 tablet (50 mg total) by mouth daily. 90 tablet 3  . nitroGLYCERIN (NITROSTAT) 0.4 MG SL tablet Place 1 tablet (0.4 mg total) under the tongue every 5 (five) minutes as needed  for chest pain. (Patient taking differently: Place 0.4 mg under the tongue every 5 (five) minutes x 3 doses as needed for chest pain. ) 90 tablet 3  . Potassium Chloride ER 20 MEQ TBCR Take 1 tablet as needed on days Lasix is taken. 30 tablet 3  . rosuvastatin (CRESTOR) 40 MG tablet Take 40 mg by mouth every evening.     . traMADol (ULTRAM) 50 MG tablet Take 1-2 tablets (50-100 mg total) by mouth every 4 (four) hours as needed for moderate pain. (Patient not taking: Reported on 02/27/2020) 30 tablet 0   No current facility-administered medications for this visit.    Allergies:   Patient has no known allergies.    Social History:  The patient  reports that he has never smoked. He has never used smokeless tobacco. He reports current alcohol use of about 3.0 standard drinks of alcohol per week.   Family History:  The patient's family history includes Heart attack in his brother, brother, and father; Heart disease in his brother, sister, and sister.    ROS:  Please see the history of present illness.   Otherwise, review of systems are positive for none.   All other systems are reviewed and negative.    PHYSICAL EXAM: VS:  BP 128/76   Pulse 82   Temp 98.6 F (37 C) Comment: Forehead  Ht 5\' 9"  (1.753 m)   Wt 202 lb 9.6 oz (91.9 kg)   SpO2 97%   BMI 29.92 kg/m  , BMI Body mass index is 29.92 kg/m. GEN: Well nourished, well developed, in no acute distress HEENT: sclera anicteric Neck: no JVD, carotid bruits, or masses; s/p L CEA with incision site healing well without erythema or drainage Cardiac: RRR; no murmurs, rubs, or gallops, Trace RLE edema; sternal incision healing well without erythema or drainage Respiratory:  clear to auscultation bilaterally, normal work of breathing GI: soft, nontender, nondistended, + BS MS: no deformity or atrophy Skin: warm and dry, no rash Neuro:  Strength and sensation are intact Psych: euthymic mood, full affect   EKG:  EKG is ordered today. The  ekg ordered today demonstrates NSR with PACs, rate 82 bpm, no STE/D; ST-T wave abnormalities have improved from previous.    Recent Labs: 02/11/2020: ALT 26 02/13/2020: Magnesium 2.3 02/16/2020: BUN 16; Creatinine, Ser 1.07; Hemoglobin 10.3; Platelets 185; Potassium 3.9; Sodium 139    Lipid Panel    Component Value Date/Time   CHOL 164 01/28/2020 1043   TRIG 113 01/28/2020 1043   HDL 47 01/28/2020 1043   CHOLHDL 3.5 01/28/2020 1043   LDLCALC 97 01/28/2020 1043      Wt Readings from Last 3 Encounters:  02/27/20 202 lb 9.6 oz (91.9 kg)  02/21/20 201 lb 8 oz (91.4 kg)  02/17/20 199 lb 8 oz (90.5 kg)      Other studies Reviewed: Additional studies/ records that were reviewed today include:   Left heart catheterization 02/11/20:  Ramus lesion  is 50% stenosed.  Dist LM to Ost LAD lesion is 30% stenosed.  1st Mrg lesion is 60% stenosed.  Prox LAD lesion is 90% stenosed.  Prox LAD to Mid LAD lesion is 60% stenosed.  Mid LAD lesion is 70% stenosed.  Prox RCA lesion is 99% stenosed.  Ost RCA to Prox RCA lesion is 90% stenosed.  Mid RCA lesion is 50% stenosed.  Dist RCA lesion is 20% stenosed.  Severe coronary calcification with multivessel CAD. There is 30% distal narrowing of the left main coronary artery prior to its trifurcation into the LAD, ramus intermediate, and left circumflex vessel. The LAD has 90% very proximal (near ostial) stenosis followed by diffuse 60 and 70% proximal to mid calcified stenoses with diffuse 70% stenosis in the diagonal vessel; 50% very proximal ramus intermediate stenosis; 60% ostial OM1 stenosis and large dominant RCA with 90% proximal followed by 99% calcified proximal to mid stenosis with 50% mid stenosis and mild 20% areas of luminal irregularity.  CABG/ L CEA 02/12/20:   CABG X4. LIMA LAD, RSVG PDA, Ramus, and OM1Endoscopic greater saphenous vein harvest on theright Intra-operative Transesophageal Echocardiogram (Left carotid  endarterectomy and Dacron patch angioplasty- Please refer to Dr. Luther Parody note for further details)  Echocardiogram 01/08/20: 1. Left ventricular ejection fraction, by visual estimation, is 60 to  65%. The left ventricle has normal function. Left ventricular septal wall  thickness was mildly increased. Mildly increased left ventricular  posterior wall thickness. There is no left  ventricular hypertrophy.  2. The left ventricle has no regional wall motion abnormalities.  3. Global right ventricle has normal systolic function.The right  ventricular size is normal. No increase in right ventricular wall  thickness.  4. Left atrial size was normal.  5. Right atrial size was normal.  6. Mild mitral annular calcification.  7. The mitral valve is normal in structure. Trivial mitral valve  regurgitation. No evidence of mitral stenosis.  8. The tricuspid valve is normal in structure.  9. The aortic valve is tricuspid. Aortic valve regurgitation is mild. No  evidence of aortic valve sclerosis or stenosis.  10. The pulmonic valve was normal in structure. Pulmonic valve  regurgitation is not visualized.  11. Normal pulmonary artery systolic pressure.  12. The inferior vena cava is normal in size with greater than 50%  respiratory variability, suggesting right atrial pressure of 3 mmHg.     ASSESSMENT AND PLAN:  1. CAD s/p CABG 02/12/20: no anginal complaints. Sternal incision is healing well - Continue aspirin, statin, and zetia  2. HTN: BP 128/76 today - Will consolidate metoprolol to 50mg  daily - Will transition lasix and potassium to prn given improvement in swelling and clear lungs on exam. He weighs himself daily and will monitor for weight gain of 3lbs in 1 day or 5lbs in 1 week to determine need for lasix.   3. HLD: LDL 97 02/17/20 on crestor 40mg  daily and zetia 10mg  daily - Will repeat a FLP in 1 month - if not at goal LDL <70, will consider referral to lipid clinic  4.  Post-op atrial fibrillation: no complaints of palpitations, however he was unaware of Afib during his hospitalization. EKG today with sinus rhythm - Will taper amiodarone to 200mg  daily - If no reoccurrence at follow-up visit in 2 months can consider discontinuation of amiodarone  5. Carotid artery disease s/p left CEA: incision site is healing well. - Continue aspirin, statin, and zetia  6. Apneic episodes during hospitalization: he also notes  snoring. He has never had a sleep study done before. Given CAD and atrial fibrillation during his hospitalization feel he would benefit from a sleep study - Will order a home sleep study to r/o OSA    Current medicines are reviewed at length with the patient today.  The patient does not have concerns regarding medicines.  The following changes have been made:  As above  Labs/ tests ordered today include:   Orders Placed This Encounter  Procedures  . Lipid panel  . EKG 12-Lead  . Split night study     Disposition:   FU with Dr. Claiborne Billings in 2 months  Signed, Abigail Butts, PA-C  02/28/2020 8:50 AM

## 2020-02-27 ENCOUNTER — Encounter: Payer: Self-pay | Admitting: Medical

## 2020-02-27 ENCOUNTER — Ambulatory Visit (INDEPENDENT_AMBULATORY_CARE_PROVIDER_SITE_OTHER): Payer: 59 | Admitting: Medical

## 2020-02-27 ENCOUNTER — Other Ambulatory Visit: Payer: Self-pay

## 2020-02-27 VITALS — BP 128/76 | HR 82 | Temp 98.6°F | Ht 69.0 in | Wt 202.6 lb

## 2020-02-27 DIAGNOSIS — R9431 Abnormal electrocardiogram [ECG] [EKG]: Secondary | ICD-10-CM | POA: Diagnosis not present

## 2020-02-27 DIAGNOSIS — E785 Hyperlipidemia, unspecified: Secondary | ICD-10-CM

## 2020-02-27 DIAGNOSIS — I209 Angina pectoris, unspecified: Secondary | ICD-10-CM

## 2020-02-27 DIAGNOSIS — I10 Essential (primary) hypertension: Secondary | ICD-10-CM | POA: Diagnosis not present

## 2020-02-27 DIAGNOSIS — R0683 Snoring: Secondary | ICD-10-CM

## 2020-02-27 DIAGNOSIS — I480 Paroxysmal atrial fibrillation: Secondary | ICD-10-CM

## 2020-02-27 MED ORDER — METOPROLOL SUCCINATE ER 50 MG PO TB24: 50.0000 mg | ORAL_TABLET | Freq: Every day | ORAL | 3 refills | Status: DC

## 2020-02-27 MED ORDER — FUROSEMIDE 40 MG PO TABS: ORAL_TABLET | ORAL | 3 refills | Status: DC

## 2020-02-27 MED ORDER — POTASSIUM CHLORIDE ER 20 MEQ PO TBCR: EXTENDED_RELEASE_TABLET | ORAL | 3 refills | Status: DC

## 2020-02-27 MED ORDER — AMIODARONE HCL 200 MG PO TABS: 200.0000 mg | ORAL_TABLET | Freq: Every day | ORAL | 3 refills | Status: DC

## 2020-02-27 NOTE — Patient Instructions (Signed)
Medication Instructions:   DECREASE AMIODARONE to 200 mg daily  TAKE METOPROLOL SUCCINATE 50 mg daily  TAKE LASIX AS NEEDED FOR WEIGHT GAIN OF 3 LBS IN A DAY AND/OR 5 LBS IN A WEEK  TAKE POTASSIUM AS NEEDED ON DAYS LASIX IS TAKEN  *If you need a refill on your cardiac medications before your next appointment, please call your pharmacy*   Lab Work: Your physician recommends that you return for lab work in 1 MONTH:   FASTING LIPID PANEL-DO NOT EAT OR DRINK PAST MIDNIGHT. (OK TO [place])  If you have labs (blood work) drawn today and your tests are completely normal, you will receive your results only by: Marland Kitchen MyChart Message (if you have MyChart) OR . A paper copy in the mail If you have any lab test that is abnormal or we need to change your treatment, we will call you to review the results.  Testing/Procedures: Your physician has recommended that you have a sleep study. This test records several body functions during sleep, including: brain activity, eye movement, oxygen and carbon dioxide blood levels, heart rate and rhythm, breathing rate and rhythm, the flow of air through your mouth and nose, snoring, body muscle movements, and chest and belly movement.    Follow-Up: At [facility], you and your health needs are our priority.  As part of our continuing mission to provide you with exceptional heart care, we have created designated Provider Care Teams.  These Care Teams include your primary Cardiologist (physician) and Advanced Practice Providers (APPs -  Physician Assistants and Nurse Practitioners) who all work together to provide you with the care you need, when you need it.  We recommend signing up for the patient portal called "MyChart".  Sign up information is provided on this After Visit Summary.  MyChart is used to connect with patients for Virtual Visits (Telemedicine).  Patients are able to view lab/test results, encounter notes, upcoming appointments, etc.  Non-urgent  messages can be sent to your provider as well.   To learn more about what you can do with MyChart, go to [email or web address].    Your next appointment:   2 month(s)  The format for your next appointment:   In Person  Provider:   Shelva Majestic, MD  Other Instructions

## 2020-02-28 ENCOUNTER — Ambulatory Visit: Payer: 59 | Admitting: Thoracic Surgery (Cardiothoracic Vascular Surgery)

## 2020-03-03 ENCOUNTER — Ambulatory Visit (INDEPENDENT_AMBULATORY_CARE_PROVIDER_SITE_OTHER): Payer: 59 | Admitting: Vascular Surgery

## 2020-03-03 ENCOUNTER — Encounter: Payer: Self-pay | Admitting: Vascular Surgery

## 2020-03-03 ENCOUNTER — Other Ambulatory Visit: Payer: Self-pay

## 2020-03-03 VITALS — BP 119/72 | HR 67 | Temp 98.0°F | Resp 20 | Ht 69.0 in | Wt 203.0 lb

## 2020-03-03 DIAGNOSIS — I6522 Occlusion and stenosis of left carotid artery: Secondary | ICD-10-CM

## 2020-03-03 NOTE — Progress Notes (Signed)
   Patient name: Jesus Francis MRN: [id number] DOB: [birth year] Sex: male  REASON FOR VISIT: Follow-up left carotid endarterectomy in conjunction with coronary artery bypass grafting  HPI: Jesus Francis is a 64 y.o. male here today for follow-up.  He presented with unstable coronary disease and on 3 coronary bypass screening was found to have critical left carotid stenosis.  On further discussion he did have a very clear-cut left brain TIA several months prior to presentation.  He underwent combined left carotid endarterectomy by myself and coronary artery bypass grafting with Dr. Kipp Brood.  He did extremely well and was discharged without complication.  He reports that he is walking 30 minutes a day.  He has had no neurologic deficits.  He does have the usual periincisional numbness in the left neck  Current Outpatient Medications  Medication Sig Dispense Refill  . acetaminophen (TYLENOL) 500 MG tablet Take 2 tablets (1,000 mg total) by mouth every 6 (six) hours as needed. 30 tablet 0  . amiodarone (PACERONE) 200 MG tablet Take 1 tablet (200 mg total) by mouth daily. 90 tablet 3  . aspirin EC 81 MG tablet Take 81 mg by mouth at bedtime.     Marland Kitchen ezetimibe (ZETIA) 10 MG tablet Take 1 tablet (10 mg total) by mouth daily. (Patient taking differently: Take 10 mg by mouth every evening. ) 90 tablet 3  . metoprolol succinate (TOPROL-XL) 50 MG 24 hr tablet Take 1 tablet (50 mg total) by mouth daily. 90 tablet 3  . nitroGLYCERIN (NITROSTAT) 0.4 MG SL tablet Place 1 tablet (0.4 mg total) under the tongue every 5 (five) minutes as needed for chest pain. (Patient taking differently: Place 0.4 mg under the tongue every 5 (five) minutes x 3 doses as needed for chest pain. ) 90 tablet 3  . rosuvastatin (CRESTOR) 40 MG tablet Take 40 mg by mouth every evening.     . furosemide (LASIX) 40 MG tablet Take 1 tablet as needed for weight gain of 3 lbs in a day or 5 lbs in a week  (Patient not taking: Reported on 03/03/2020) 30 tablet 3  . Potassium Chloride ER 20 MEQ TBCR Take 1 tablet as needed on days Lasix is taken. (Patient not taking: Reported on 03/03/2020) 30 tablet 3  . traMADol (ULTRAM) 50 MG tablet Take 1-2 tablets (50-100 mg total) by mouth every 4 (four) hours as needed for moderate pain. (Patient not taking: Reported on 02/27/2020) 30 tablet 0   No current facility-administered medications for this visit.     PHYSICAL EXAM: Vitals:   03/03/20 0819 03/03/20 0821  BP: 140/86 119/72  Pulse: 67   Resp: 20   Temp: 98 F (36.7 C)   SpO2: 99%   Weight: 92.1 kg   Height: 5\' 9"  (1.753 m)     GENERAL: The patient is a well-nourished male, in no acute distress. The vital signs are documented above. Left neck incision is well-healed.  He has no bruits bilaterally.  2+ radial pulses bilaterally  MEDICAL ISSUES: Stable status post left carotid endarterectomy in conjunction with coronary artery bypass grafting on 02/12/2020.  Will resume full activity from a carotid standpoint.  Will see [place] in 9 months with repeat carotid duplex.  He will notify should he develop any neurologic deficits or wound issues   Rosetta Posner, MD [facility] Tel [phone] Pager [phone]

## 2020-03-04 ENCOUNTER — Other Ambulatory Visit: Payer: Self-pay | Admitting: *Deleted

## 2020-03-04 DIAGNOSIS — I6522 Occlusion and stenosis of left carotid artery: Secondary | ICD-10-CM

## 2020-03-10 ENCOUNTER — Telehealth (HOSPITAL_COMMUNITY): Payer: Self-pay

## 2020-03-10 NOTE — Telephone Encounter (Signed)
Called patient to see if he was interested in participating in the Cardiac Rehab Program. Patient stated yes. Patient will come in for orientation on 03/24/20 @ 830AM and will attend the 7:00AM exercise class. Went over insurance, patient verbalized understanding.   Mailed letter.

## 2020-03-12 ENCOUNTER — Other Ambulatory Visit: Payer: Self-pay | Admitting: Physician Assistant

## 2020-03-17 ENCOUNTER — Telehealth (HOSPITAL_COMMUNITY): Payer: Self-pay | Admitting: Pharmacist

## 2020-03-17 NOTE — Telephone Encounter (Signed)
Cardiac Rehab Medication Review by a Pharmacist  Does the patient  feel that his/her medications are working for him/her?  Yes- Patient does feel like they are benficial  Has the patient been experiencing any side effects to the medications prescribed?  Yes - just the low blood pressure from metoprolol.   Does the patient measure his/her own blood pressure or blood glucose at home?  yes - Patient checks BP everyday. In the last 10 BP readings, the average BP has been 103/63, average pulse 66. Patient does report some blood pressure in the 80/52, 81/53, and pulses around 60. Also 93/60 with a 60 pulse. Highest BP number was 126/76 which was after 30 minutes of walking.  Does the patient have any problems obtaining medications due to transportation or finances?   No issues  Understanding of regimen: excellent Understanding of indications: excellent Potential of compliance: excellent   Pharmacist comments: Patient reports having hypotension with metoprolol succinate 50 mg daily. He reports that he felt better when he was taking 25 mg and did not have the issues with low blood pressure.  Patient would also like for Dr. Evette Georges office to advise him on how long he needs to take amiodarone and if there is a plan to reduce the dose any further.    Sherren Kerns, PharmD PGY1 Acute Care Pharmacy Resident 03/17/2020 4:32 PM

## 2020-03-19 ENCOUNTER — Encounter (HOSPITAL_COMMUNITY)
Admission: RE | Admit: 2020-03-19 | Discharge: 2020-03-19 | Disposition: A | Discharge status: Home / Self Care | Payer: 59 | Source: Ambulatory Visit | Attending: Cardiovascular Disease | Admitting: Cardiovascular Disease

## 2020-03-19 ENCOUNTER — Other Ambulatory Visit: Payer: Self-pay

## 2020-03-19 ENCOUNTER — Other Ambulatory Visit: Payer: Self-pay | Admitting: Thoracic Surgery (Cardiothoracic Vascular Surgery)

## 2020-03-19 DIAGNOSIS — Z951 Presence of aortocoronary bypass graft: Secondary | ICD-10-CM | POA: Insufficient documentation

## 2020-03-19 NOTE — Progress Notes (Signed)
PC to pt to confirm appointment for cardiac rehab orientation and obtain health history.  Pt confirmed appointment for 03/24/2020 at 0830.  Health history obtained.  Pt voiced concerns related to his BP.  Of note, he discussed this with the pharmacist as prescreening for cardiac rehab.  This note was routed to Dr. Claiborne Billings for review. He also reports some pain with inspiration on the right side of his incision and a popping sensation.  Pt has OV with surgeon tomorrow with CXR.  Encouraged pt to discuss this with his provider.

## 2020-03-20 ENCOUNTER — Ambulatory Visit
Admission: RE | Admit: 2020-03-20 | Discharge: 2020-03-20 | Disposition: A | Discharge status: Home / Self Care | Payer: 59 | Source: Ambulatory Visit | Attending: Thoracic Surgery (Cardiothoracic Vascular Surgery) | Admitting: Thoracic Surgery (Cardiothoracic Vascular Surgery)

## 2020-03-20 ENCOUNTER — Encounter: Payer: Self-pay | Admitting: Thoracic Surgery (Cardiothoracic Vascular Surgery)

## 2020-03-20 ENCOUNTER — Ambulatory Visit (INDEPENDENT_AMBULATORY_CARE_PROVIDER_SITE_OTHER): Payer: Self-pay | Admitting: Thoracic Surgery (Cardiothoracic Vascular Surgery)

## 2020-03-20 ENCOUNTER — Other Ambulatory Visit: Payer: Self-pay

## 2020-03-20 VITALS — BP 147/87 | HR 68 | Temp 99.1°F | Resp 20 | Ht 69.0 in | Wt 202.0 lb

## 2020-03-20 DIAGNOSIS — Z951 Presence of aortocoronary bypass graft: Secondary | ICD-10-CM

## 2020-03-20 NOTE — Progress Notes (Signed)
      [address]       [place] [address]             [phone]        Pranay M Grabinski [facility] Medical Record [id number] Date of Birth: 1956-06-11  Referring: Troy Sine, MD Primary Care: Antony Contras, MD Primary Cardiologist:Thomas Claiborne Billings, MD  Reason for visit:   follow-up  History of Present Illness:     Mr. Ugalde comes in for his follow-up appointment.  He is done exceptionally well.  He remains very active and has had no issues.  He does complain of some musculoskeletal tightness along the incision.  Physical Exam: BP (!) 147/87 (BP Location: Right Arm)   Pulse 68   Temp 99.1 F (37.3 C) (Temporal)   Resp 20   Ht 5\' 9"  (1.753 m)   Wt 202 lb (91.6 kg)   SpO2 97% Comment: RA  BMI 29.83 kg/m   Alert NAD Incision well-healed.  Sternum stable Abdomen soft, ND No peripheral edema   Diagnostic Studies & Laboratory data: CXR: Clear     Assessment / Plan:   64 year old male status post CABG.  Doing well. Cleared for cardiac rehab Follow-up as needed.   Lajuana Matte 03/20/2020 3:03 PM

## 2020-03-24 ENCOUNTER — Other Ambulatory Visit: Payer: Self-pay

## 2020-03-24 ENCOUNTER — Encounter (HOSPITAL_COMMUNITY)
Admission: RE | Admit: 2020-03-24 | Discharge: 2020-03-24 | Disposition: A | Discharge status: Home / Self Care | Payer: 59 | Source: Ambulatory Visit | Attending: Cardiovascular Disease | Admitting: Cardiovascular Disease

## 2020-03-24 ENCOUNTER — Encounter (HOSPITAL_COMMUNITY): Payer: Self-pay

## 2020-03-24 VITALS — BP 140/80 | HR 74 | Temp 97.3°F | Ht 69.75 in | Wt 200.2 lb

## 2020-03-24 DIAGNOSIS — Z951 Presence of aortocoronary bypass graft: Secondary | ICD-10-CM

## 2020-03-24 HISTORY — DX: Essential (primary) hypertension: I10

## 2020-03-24 HISTORY — DX: Atherosclerotic heart disease of native coronary artery without angina pectoris: I25.10

## 2020-03-24 NOTE — Progress Notes (Signed)
Cardiac Individual Treatment Plan  Patient Details  Name: PRESLEY SUMMERLIN MRN: [id number] Date of Birth: 1956-01-27 Referring Provider:     CARDIAC REHAB PHASE II ORIENTATION from 03/24/2020 in [facility]  Referring Provider  Troy Sine, MD      Initial Encounter Date:    CARDIAC REHAB PHASE II ORIENTATION from 03/24/2020 in [facility]  Date  03/24/20      Visit Diagnosis: S/P CABG x 4 02/12/20  S/P L Carotid Endarterctomy  Patient's Home Medications on Admission:  Current Outpatient Medications:  .  acetaminophen (TYLENOL) 500 MG tablet, Take 2 tablets (1,000 mg total) by mouth every 6 (six) hours as needed. (Patient taking differently: Take 1,000 mg by mouth in the morning and at bedtime. ), Disp: 30 tablet, Rfl: 0 .  amiodarone (PACERONE) 200 MG tablet, Take 1 tablet (200 mg total) by mouth daily., Disp: 90 tablet, Rfl: 3 .  aspirin EC 81 MG tablet, Take 81 mg by mouth at bedtime. , Disp: , Rfl:  .  ezetimibe (ZETIA) 10 MG tablet, Take 1 tablet (10 mg total) by mouth daily. (Patient taking differently: Take 10 mg by mouth every evening. ), Disp: 90 tablet, Rfl: 3 .  metoprolol succinate (TOPROL-XL) 50 MG 24 hr tablet, Take 1 tablet (50 mg total) by mouth daily., Disp: 90 tablet, Rfl: 3 .  nitroGLYCERIN (NITROSTAT) 0.4 MG SL tablet, Place 1 tablet (0.4 mg total) under the tongue every 5 (five) minutes as needed for chest pain., Disp: 90 tablet, Rfl: 3 .  rosuvastatin (CRESTOR) 40 MG tablet, Take 40 mg by mouth every evening. , Disp: , Rfl:   Past Medical History: Past Medical History:  Diagnosis Date  . Carotid artery occlusion   . Coronary artery disease   . Hyperlipidemia   . Hypertension     Tobacco Use: Social History   Tobacco Use  Smoking Status Never Smoker  Smokeless Tobacco Never Used    Labs: Recent Review Flowsheet Data    Labs for ITP Cardiac and Pulmonary Rehab Latest Ref Rng & Units  02/12/2020 02/12/2020 02/12/2020 02/13/2020 02/13/2020   Cholestrol 100 - 199 mg/dL - - - - -   LDLCALC 0 - 99 mg/dL - - - - -   HDL >39 mg/dL - - - - -   Trlycerides 0 - 149 mg/dL - - - - -   Hemoglobin A1c 4.8 - 5.6 % - - - - -   PHART 7.350 - 7.450 7.256(L) 7.299(L) 7.349(L) 7.315(L) 7.455(H)   PCO2ART 32.0 - 48.0 mmHg 47.6 49.6(H) 48.0 46.0 33.9   HCO3 20.0 - 28.0 mmol/L 21.2 24.3 26.4 23.3 23.9   TCO2 22 - 32 mmol/L _0 ACIDBASEDEF 0.0 - 2.0 mmol/L 6.0(H) 2.0 - 3.0(H) -   O2SAT % 94.0 97.0 100.0 100.0 100.0      Capillary Blood Glucose: Lab Results  Component Value Date   GLUCAP 134 (H) 02/16/2020   GLUCAP 118 (H) 02/16/2020   GLUCAP 103 (H) 02/16/2020   GLUCAP 95 02/15/2020   GLUCAP 144 (H) 02/15/2020     Exercise Target Goals: Exercise Program Goal: Individual exercise prescription set using results from initial 6 min walk test and THRR while considering  patient's activity barriers and safety.   Exercise Prescription Goal: Starting with aerobic activity 30 plus minutes a day, 3 days per week for initial exercise prescription. Provide home exercise prescription and guidelines  that participant acknowledges understanding prior to discharge.  Activity Barriers & Risk Stratification: Activity Barriers & Cardiac Risk Stratification - 03/24/20 0854      Activity Barriers & Cardiac Risk Stratification   Activity Barriers  None    Cardiac Risk Stratification  High       6 Minute Walk: 6 Minute Walk    Row Name 03/24/20 0902         6 Minute Walk   Phase  Initial     Distance  1868 feet     Walk Time  6 minutes     # of Rest Breaks  0     MPH  3.54     METS  4.53     RPE  11     Perceived Dyspnea   0     VO2 Peak  15.86     Symptoms  No     Resting HR  74 bpm     Resting BP  140/80     Resting Oxygen Saturation   99 %     Exercise Oxygen Saturation  during 6 min walk  97 %     Max Ex. HR  103 bpm     Max Ex. BP  174/62     2 Minute Post BP   138/80        Oxygen Initial Assessment:   Oxygen Re-Evaluation:   Oxygen Discharge (Final Oxygen Re-Evaluation):   Initial Exercise Prescription: Initial Exercise Prescription - 03/24/20 1000      Date of Initial Exercise RX and Referring Provider   Date  03/24/20    Referring Provider  Troy Sine, MD    Expected Discharge Date  05/22/20      Treadmill   MPH  3.2    Grade  1    Minutes  15    METs  3.89      NuStep   Level  4    SPM  85    Minutes  15    METs  3      Prescription Details   Frequency (times per week)  3    Duration  Progress to 30 minutes of continuous aerobic without signs/symptoms of physical distress      Intensity   THRR 40-80% of Max Heartrate  62-125    Ratings of Perceived Exertion  11-13    Perceived Dyspnea  0-4      Progression   Progression  Continue to progress workloads to maintain intensity without signs/symptoms of physical distress.      Resistance Training   Training Prescription  Yes    Weight  4lbs    Reps  10-15       Perform Capillary Blood Glucose checks as needed.  Exercise Prescription Changes:   Exercise Comments:   Exercise Goals and Review:  Exercise Goals    Row Name 03/24/20 0854             Exercise Goals   Increase Physical Activity  Yes       Intervention  Provide advice, education, support and counseling about physical activity/exercise needs.;Develop an individualized exercise prescription for aerobic and resistive training based on initial evaluation findings, risk stratification, comorbidities and participant's personal goals.       Expected Outcomes  Short Term: Attend rehab on a regular basis to increase amount of physical activity.;Long Term: Exercising regularly at least 3-5 days a week.;Long Term: Add in home exercise to make exercise  part of routine and to increase amount of physical activity.       Increase Strength and Stamina  Yes       Intervention  Provide advice, education,  support and counseling about physical activity/exercise needs.;Develop an individualized exercise prescription for aerobic and resistive training based on initial evaluation findings, risk stratification, comorbidities and participant's personal goals.       Expected Outcomes  Short Term: Increase workloads from initial exercise prescription for resistance, speed, and METs.;Short Term: Perform resistance training exercises routinely during rehab and add in resistance training at home;Long Term: Improve cardiorespiratory fitness, muscular endurance and strength as measured by increased METs and functional capacity (6MWT)       Able to understand and use rate of perceived exertion (RPE) scale  Yes       Intervention  Provide education and explanation on how to use RPE scale       Expected Outcomes  Short Term: Able to use RPE daily in rehab to express subjective intensity level;Long Term:  Able to use RPE to guide intensity level when exercising independently       Knowledge and understanding of Target Heart Rate Range (THRR)  Yes       Intervention  Provide education and explanation of THRR including how the numbers were predicted and where they are located for reference       Expected Outcomes  Short Term: Able to state/look up THRR;Long Term: Able to use THRR to govern intensity when exercising independently;Short Term: Able to use daily as guideline for intensity in rehab       Able to check pulse independently  Yes       Intervention  Provide education and demonstration on how to check pulse in carotid and radial arteries.;Review the importance of being able to check your own pulse for safety during independent exercise       Expected Outcomes  Short Term: Able to explain why pulse checking is important during independent exercise;Long Term: Able to check pulse independently and accurately       Understanding of Exercise Prescription  Yes       Intervention  Provide education, explanation, and written  materials on patient's individual exercise prescription       Expected Outcomes  Short Term: Able to explain program exercise prescription;Long Term: Able to explain home exercise prescription to exercise independently          Exercise Goals Re-Evaluation :    Discharge Exercise Prescription (Final Exercise Prescription Changes):   Nutrition:  Target Goals: Understanding of nutrition guidelines, daily intake of sodium <1525m, cholesterol <2057m calories 30% from fat and 7% or less from saturated fats, daily to have 5 or more servings of fruits and vegetables.  Biometrics: Pre Biometrics - 03/24/20 0853      Pre Biometrics   Height  5' 9.75" (1.772 m)    Weight  90.8 kg    Waist Circumference  41.5 inches    Hip Circumference  41.5 inches    Waist to Hip Ratio  1 %    BMI (Calculated)  28.92    Triceps Skinfold  10 mm    % Body Fat  26.8 %    Grip Strength  44 kg    Flexibility  14 in    Single Leg Stand  22.56 seconds        Nutrition Therapy Plan and Nutrition Goals:   Nutrition Assessments:   Nutrition Goals Re-Evaluation:   Nutrition  Goals Discharge (Final Nutrition Goals Re-Evaluation):   Psychosocial: Target Goals: Acknowledge presence or absence of significant depression and/or stress, maximize coping skills, provide positive support system. Participant is able to verbalize types and ability to use techniques and skills needed for reducing stress and depression.  Initial Review & Psychosocial Screening: Initial Psych Review & Screening - 03/24/20 0928      Initial Review   Current issues with  None Identified      Family Dynamics   Good Support System?  Yes   Richardson Landry lives alone and has his girlfirend and children forn support   Comments  Steve's wife passed away from multiple myeloma 6 years ago. Richardson Landry has support of his girfriend and adult children      Barriers   Psychosocial barriers to participate in program  There are no identifiable barriers  or psychosocial needs.      Screening Interventions   Interventions  Encouraged to exercise       Quality of Life Scores: Quality of Life - 03/24/20 0854      Quality of Life   Select  Quality of Life      Quality of Life Scores   Health/Function Pre  29.2 %    Socioeconomic Pre  30 %    Psych/Spiritual Pre  30 %    Family Pre  30 %    GLOBAL Pre  29.65 %      Scores of 19 and below usually indicate a poorer quality of life in these areas.  A difference of  2-3 points is a clinically meaningful difference.  A difference of 2-3 points in the total score of the Quality of Life Index has been associated with significant improvement in overall quality of life, self-image, physical symptoms, and general health in studies assessing change in quality of life.  PHQ-9: Recent Review Flowsheet Data    Depression screen [facility] 2/9 03/24/2020   Decreased Interest 0   Down, Depressed, Hopeless 0   PHQ - 2 Score 0     Interpretation of Total Score  Total Score Depression Severity:  1-4 = Minimal depression, 5-9 = Mild depression, 10-14 = Moderate depression, 15-19 = Moderately severe depression, 20-27 = Severe depression   Psychosocial Evaluation and Intervention:   Psychosocial Re-Evaluation:   Psychosocial Discharge (Final Psychosocial Re-Evaluation):   Vocational Rehabilitation: Provide vocational rehab assistance to qualifying candidates.   Vocational Rehab Evaluation & Intervention: Vocational Rehab - 03/24/20 0942      Initial Vocational Rehab Evaluation & Intervention   Assessment shows need for Vocational Rehabilitation  No   Richardson Landry works full time and does not need vocational rehab at this time      Education: Education Goals: Education classes will be provided on a weekly basis, covering required topics. Participant will state understanding/return demonstration of topics presented.  Learning Barriers/Preferences: Learning Barriers/Preferences - 03/24/20 1139       Learning Barriers/Preferences   Learning Barriers  None    Learning Preferences  Audio;Group Instruction;Individual Instruction;Pictoral;Skilled Demonstration;Verbal Instruction;Video;Written Material       Education Topics: Hypertension, Hypertension Reduction -Define heart disease and high blood pressure. Discus how high blood pressure affects the body and ways to reduce high blood pressure.   Exercise and Your Heart -Discuss why it is important to exercise, the FITT principles of exercise, normal and abnormal responses to exercise, and how to exercise safely.   Angina -Discuss definition of angina, causes of angina, treatment of angina, and how to decrease risk  of having angina.   Cardiac Medications -Review what the following cardiac medications are used for, how they affect the body, and side effects that may occur when taking the medications.  Medications include Aspirin, Beta blockers, calcium channel blockers, ACE Inhibitors, angiotensin receptor blockers, diuretics, digoxin, and antihyperlipidemics.   Congestive Heart Failure -Discuss the definition of CHF, how to live with CHF, the signs and symptoms of CHF, and how keep track of weight and sodium intake.   Heart Disease and Intimacy -Discus the effect sexual activity has on the heart, how changes occur during intimacy as we age, and safety during sexual activity.   Smoking Cessation / COPD -Discuss different methods to quit smoking, the health benefits of quitting smoking, and the definition of COPD.   Nutrition I: Fats -Discuss the types of cholesterol, what cholesterol does to the heart, and how cholesterol levels can be controlled.   Nutrition II: Labels -Discuss the different components of food labels and how to read food label   Heart Parts/Heart Disease and PAD -Discuss the anatomy of the heart, the pathway of blood circulation through the heart, and these are affected by heart disease.   Stress I:  Signs and Symptoms -Discuss the causes of stress, how stress may lead to anxiety and depression, and ways to limit stress.   Stress II: Relaxation -Discuss different types of relaxation techniques to limit stress.   Warning Signs of Stroke / TIA -Discuss definition of a stroke, what the signs and symptoms are of a stroke, and how to identify when someone is having stroke.   Knowledge Questionnaire Score: Knowledge Questionnaire Score - 03/24/20 1026      Knowledge Questionnaire Score   Pre Score  21/24       Core Components/Risk Factors/Patient Goals at Admission: Personal Goals and Risk Factors at Admission - 03/24/20 1140      Core Components/Risk Factors/Patient Goals on Admission    Weight Management  Yes;Weight Maintenance    Intervention  Weight Management: Develop a combined nutrition and exercise program designed to reach desired caloric intake, while maintaining appropriate intake of nutrient and fiber, sodium and fats, and appropriate energy expenditure required for the weight goal.;Weight Management: Provide education and appropriate resources to help participant work on and attain dietary goals.    Expected Outcomes  Weight Maintenance: Understanding of the daily nutrition guidelines, which includes 25-35% calories from fat, 7% or less cal from saturated fats, less than 237m cholesterol, less than 1.5gm of sodium, & 5 or more servings of fruits and vegetables daily;Weight Loss: Understanding of general recommendations for a balanced deficit meal plan, which promotes 1-2 lb weight loss per week and includes a negative energy balance of [phone] kcal/d    Hypertension  Yes    Intervention  Provide education on lifestyle modifcations including regular physical activity/exercise, weight management, moderate sodium restriction and increased consumption of fresh fruit, vegetables, and low fat dairy, alcohol moderation, and smoking cessation.;Monitor prescription use compliance.     Expected Outcomes  Short Term: Continued assessment and intervention until BP is < 140/954mHG in hypertensive participants. < 130/8080mG in hypertensive participants with diabetes, heart failure or chronic kidney disease.;Long Term: Maintenance of blood pressure at goal levels.    Lipids  Yes    Intervention  Provide education and support for participant on nutrition & aerobic/resistive exercise along with prescribed medications to achieve LDL <26m91mDL >40mg5m Expected Outcomes  Short Term: Participant states understanding of desired cholesterol values  and is compliant with medications prescribed. Participant is following exercise prescription and nutrition guidelines.;Long Term: Cholesterol controlled with medications as prescribed, with individualized exercise RX and with personalized nutrition plan. Value goals: LDL < 73m, HDL > 40 mg.       Core Components/Risk Factors/Patient Goals Review:    Core Components/Risk Factors/Patient Goals at Discharge (Final Review):    ITP Comments: ITP Comments    Row Name 03/24/20 1129           ITP Comments  Dr TFransico HimMD, Medical Director          Comments: SRichardson Landryattended orientation on 03/24/2020 to review rules and guidelines for program.  Completed 6 minute walk test, Intitial ITP, and exercise prescription. Pre entry blood pressure 140/80 HR 73 Post exercise walk test BP noted at 174/62. Post walk blood pressure 138/80. Heart rate 77. Will continue to monitor BP. Post walk test BP 138/80 heart rate 77 post exercise. Patient reports that he has white coat syndrome. Will continue to monitor BP and send today's vital signs for review.. Telemetry-Sinus Rhtyhm.  Asymptomatic. Safety measures and social distancing in place per CDC guidelines.MBarnet Pall RN,BSN 03/24/2020 11:51 AM

## 2020-03-30 ENCOUNTER — Other Ambulatory Visit: Payer: Self-pay

## 2020-03-30 ENCOUNTER — Encounter (HOSPITAL_COMMUNITY): Payer: 59

## 2020-03-30 DIAGNOSIS — E785 Hyperlipidemia, unspecified: Secondary | ICD-10-CM

## 2020-03-30 LAB — LIPID PANEL
Chol/HDL Ratio: 2.6 ratio (ref 0.0–5.0)
Cholesterol, Total: 128 mg/dL (ref 100–199)
HDL: 49 mg/dL (ref >39)
LDL Chol Calc (NIH): 62 mg/dL (ref 0–99)
Triglycerides: 91 mg/dL (ref 0–149)
VLDL Cholesterol Cal: 17 mg/dL (ref 5–40)

## 2020-04-01 ENCOUNTER — Other Ambulatory Visit: Payer: Self-pay

## 2020-04-01 ENCOUNTER — Encounter (HOSPITAL_COMMUNITY)
Admission: RE | Admit: 2020-04-01 | Discharge: 2020-04-01 | Disposition: A | Discharge status: Home / Self Care | Payer: 59 | Source: Ambulatory Visit | Attending: Cardiovascular Disease | Admitting: Cardiovascular Disease

## 2020-04-01 DIAGNOSIS — Z951 Presence of aortocoronary bypass graft: Secondary | ICD-10-CM | POA: Diagnosis present

## 2020-04-01 NOTE — Progress Notes (Signed)
Daily Session Note  Patient Details  Name: Jesus Francis MRN: [id number] Date of Birth: 1956-04-09 Referring Provider:     CARDIAC REHAB PHASE II ORIENTATION from 03/24/2020 in [facility]  Referring Provider  Troy Sine, MD      Encounter Date: 04/01/2020  Check In:   Capillary Blood Glucose: No results found for this or any previous visit (from the past 24 hour(s)).    Social History   Tobacco Use  Smoking Status Never Smoker  Smokeless Tobacco Never Used    Goals Met:  Exercise tolerated well No report of cardiac concerns or symptoms Strength training completed today  Goals Unmet:  Not Applicable  Comments: Pt started cardiac rehab today.  Pt tolerated light exercise without difficulty. VSS, telemetry-NSR, asymptomatic.  Medication list reconciled. Pt denies barriers to medicaiton compliance.  PSYCHOSOCIAL ASSESSMENT:  PHQ-0. Pt exhibits positive coping skills, hopeful outlook with supportive family. No psychosocial needs identified at this time, no psychosocial interventions necessary.  Pt oriented to exercise equipment and routine.    Understanding verbalized.   Dr. Fransico Him is Medical Director for Cardiac Rehab at [facility].

## 2020-04-03 ENCOUNTER — Encounter (HOSPITAL_COMMUNITY)
Admission: RE | Admit: 2020-04-03 | Discharge: 2020-04-03 | Disposition: A | Discharge status: Home / Self Care | Payer: 59 | Source: Ambulatory Visit | Attending: Cardiovascular Disease | Admitting: Cardiovascular Disease

## 2020-04-03 ENCOUNTER — Other Ambulatory Visit: Payer: Self-pay

## 2020-04-03 DIAGNOSIS — Z951 Presence of aortocoronary bypass graft: Secondary | ICD-10-CM | POA: Diagnosis not present

## 2020-04-06 ENCOUNTER — Encounter (HOSPITAL_COMMUNITY)
Admission: RE | Admit: 2020-04-06 | Discharge: 2020-04-06 | Disposition: A | Discharge status: Home / Self Care | Payer: 59 | Source: Ambulatory Visit | Attending: Cardiovascular Disease | Admitting: Cardiovascular Disease

## 2020-04-06 ENCOUNTER — Other Ambulatory Visit: Payer: Self-pay

## 2020-04-06 DIAGNOSIS — Z951 Presence of aortocoronary bypass graft: Secondary | ICD-10-CM

## 2020-04-06 NOTE — Progress Notes (Signed)
Jesus Francis 64 y.o. male Nutrition Note  Visit Diagnosis: S/P CABG x 4 02/12/20  S/P L Carotid Endarterctomy   Past Medical History:  Diagnosis Date  . Carotid artery occlusion   . Coronary artery disease   . Hyperlipidemia   . Hypertension      Medications reviewed.   Current Outpatient Medications:  .  acetaminophen (TYLENOL) 500 MG tablet, Take 2 tablets (1,000 mg total) by mouth every 6 (six) hours as needed. (Patient taking differently: Take 1,000 mg by mouth in the morning and at bedtime. ), Disp: 30 tablet, Rfl: 0 .  amiodarone (PACERONE) 200 MG tablet, Take 1 tablet (200 mg total) by mouth daily., Disp: 90 tablet, Rfl: 3 .  aspirin EC 81 MG tablet, Take 81 mg by mouth at bedtime. , Disp: , Rfl:  .  ezetimibe (ZETIA) 10 MG tablet, Take 1 tablet (10 mg total) by mouth daily. (Patient taking differently: Take 10 mg by mouth every evening. ), Disp: 90 tablet, Rfl: 3 .  metoprolol succinate (TOPROL-XL) 50 MG 24 hr tablet, Take 1 tablet (50 mg total) by mouth daily., Disp: 90 tablet, Rfl: 3 .  nitroGLYCERIN (NITROSTAT) 0.4 MG SL tablet, Place 1 tablet (0.4 mg total) under the tongue every 5 (five) minutes as needed for chest pain., Disp: 90 tablet, Rfl: 3 .  rosuvastatin (CRESTOR) 40 MG tablet, Take 40 mg by mouth every evening. , Disp: , Rfl:    Ht Readings from Last 1 Encounters:  03/24/20 5' 9.75" (1.772 m)     Wt Readings from Last 3 Encounters:  03/24/20 200 lb 2.8 oz (90.8 kg)  03/20/20 202 lb (91.6 kg)  03/03/20 203 lb (92.1 kg)     There is no height or weight on file to calculate BMI.   Social History   Tobacco Use  Smoking Status Never Smoker  Smokeless Tobacco Never Used     Lab Results  Component Value Date   CHOL 128 03/30/2020   Lab Results  Component Value Date   HDL 49 03/30/2020   Lab Results  Component Value Date   LDLCALC 62 03/30/2020   Lab Results  Component Value Date   TRIG 91 03/30/2020     Lab Results  Component Value  Date   HGBA1C 6.4 (H) 02/11/2020     CBG (last 3)  No results for input(s): GLUCAP in the last 72 hours.   Nutrition Note  Spoke with pt. Nutrition Plan and Nutrition Survey goals reviewed with pt. Pt is following a Heart Healthy diet.   Pt has Pre-diabetes. Last A1c indicates blood glucose well-controlled. Pt states he has had pre diabetes for years and feels confidant in his diet choices related to this number.  Pt feels satisfied with his diet. He made heart healthy changes about 4 months ago. He is not interested in further education or nutrition interventions.  Pt expressed understanding of the information reviewed.   Nutrition Diagnosis ? Food-and nutrition-related knowledge deficit related to lack of exposure to information as related to diagnosis of: ? CVD ? Pre-diabetes  Nutrition Intervention ? Benefits of adopting Heart Healthy diet discussed when Medficts reviewed.   ? Continue client-centered nutrition education by RD, as part of interdisciplinary care.  Goal(s) ? Pt to build a healthy plate including vegetables, fruits, whole grains, and low-fat dairy products in a heart healthy meal plan.  Plan:   Will provide client-centered nutrition education as part of interdisciplinary care  Monitor and evaluate progress toward  nutrition goal with team.   Michaele Offer, MS, RDN, LDN

## 2020-04-08 ENCOUNTER — Encounter (HOSPITAL_COMMUNITY)
Admission: RE | Admit: 2020-04-08 | Discharge: 2020-04-08 | Disposition: A | Discharge status: Home / Self Care | Payer: 59 | Source: Ambulatory Visit | Attending: Cardiovascular Disease | Admitting: Cardiovascular Disease

## 2020-04-08 ENCOUNTER — Other Ambulatory Visit: Payer: Self-pay

## 2020-04-08 DIAGNOSIS — Z951 Presence of aortocoronary bypass graft: Secondary | ICD-10-CM | POA: Diagnosis not present

## 2020-04-08 NOTE — Progress Notes (Signed)
   04/08/20 0702  Exercise Goal Re-Evaluation  Exercise Goals Review Increase Physical Activity;Able to understand and use rate of perceived exertion (RPE) scale;Knowledge and understanding of Target Heart Rate Range (THRR);Able to check pulse independently;Understanding of Exercise Prescription;Increase Strength and Stamina  Comments Reviewed home exercise guidelines with patient including endpoints, temperature precautions, target heart rate and rate of perceived exertion. Pt is walking 1.5-1.6 miles in about 30 minutes 3-4 days/week as his mode of home exercise. Pt knows how to manually count his pulse. Pt voices understanding of instructions given.  Expected Outcomes Patient will continue daily exercise routine to help achieve personal health and fitness goals.   Sol Passer, MS, ACSM CEP

## 2020-04-10 ENCOUNTER — Encounter (HOSPITAL_COMMUNITY)
Admission: RE | Admit: 2020-04-10 | Discharge: 2020-04-10 | Disposition: A | Discharge status: Home / Self Care | Payer: 59 | Source: Ambulatory Visit | Attending: Cardiovascular Disease | Admitting: Cardiovascular Disease

## 2020-04-10 ENCOUNTER — Other Ambulatory Visit: Payer: Self-pay

## 2020-04-10 DIAGNOSIS — Z951 Presence of aortocoronary bypass graft: Secondary | ICD-10-CM | POA: Diagnosis not present

## 2020-04-13 ENCOUNTER — Other Ambulatory Visit: Payer: Self-pay

## 2020-04-13 ENCOUNTER — Encounter (HOSPITAL_COMMUNITY)
Admission: RE | Admit: 2020-04-13 | Discharge: 2020-04-13 | Disposition: A | Discharge status: Home / Self Care | Payer: 59 | Source: Ambulatory Visit | Attending: Cardiovascular Disease | Admitting: Cardiovascular Disease

## 2020-04-13 DIAGNOSIS — Z951 Presence of aortocoronary bypass graft: Secondary | ICD-10-CM

## 2020-04-15 ENCOUNTER — Encounter (HOSPITAL_COMMUNITY)
Admission: RE | Admit: 2020-04-15 | Discharge: 2020-04-15 | Disposition: A | Discharge status: Home / Self Care | Payer: 59 | Source: Ambulatory Visit | Attending: Cardiovascular Disease | Admitting: Cardiovascular Disease

## 2020-04-15 ENCOUNTER — Other Ambulatory Visit: Payer: Self-pay

## 2020-04-15 DIAGNOSIS — Z951 Presence of aortocoronary bypass graft: Secondary | ICD-10-CM | POA: Diagnosis not present

## 2020-04-17 ENCOUNTER — Encounter (HOSPITAL_COMMUNITY)
Admission: RE | Admit: 2020-04-17 | Discharge: 2020-04-17 | Disposition: A | Discharge status: Home / Self Care | Payer: 59 | Source: Ambulatory Visit | Attending: Cardiovascular Disease | Admitting: Cardiovascular Disease

## 2020-04-17 ENCOUNTER — Other Ambulatory Visit: Payer: Self-pay

## 2020-04-17 DIAGNOSIS — Z951 Presence of aortocoronary bypass graft: Secondary | ICD-10-CM

## 2020-04-20 ENCOUNTER — Encounter (HOSPITAL_COMMUNITY)
Admission: RE | Admit: 2020-04-20 | Discharge: 2020-04-20 | Disposition: A | Discharge status: Home / Self Care | Payer: 59 | Source: Ambulatory Visit | Attending: Cardiovascular Disease | Admitting: Cardiovascular Disease

## 2020-04-20 ENCOUNTER — Other Ambulatory Visit: Payer: Self-pay

## 2020-04-20 DIAGNOSIS — Z951 Presence of aortocoronary bypass graft: Secondary | ICD-10-CM | POA: Diagnosis not present

## 2020-04-22 ENCOUNTER — Other Ambulatory Visit: Payer: Self-pay

## 2020-04-22 ENCOUNTER — Encounter (HOSPITAL_COMMUNITY)
Admission: RE | Admit: 2020-04-22 | Discharge: 2020-04-22 | Disposition: A | Discharge status: Home / Self Care | Payer: 59 | Source: Ambulatory Visit | Attending: Cardiovascular Disease | Admitting: Cardiovascular Disease

## 2020-04-22 DIAGNOSIS — Z951 Presence of aortocoronary bypass graft: Secondary | ICD-10-CM

## 2020-04-22 IMAGING — CR DG CHEST 2V
2 series · 2 of 2 positions shown · non-contrast
Comparison: None.

CLINICAL DATA: Preoperative

EXAM:
CHEST - 2 VIEW

[chest lat]
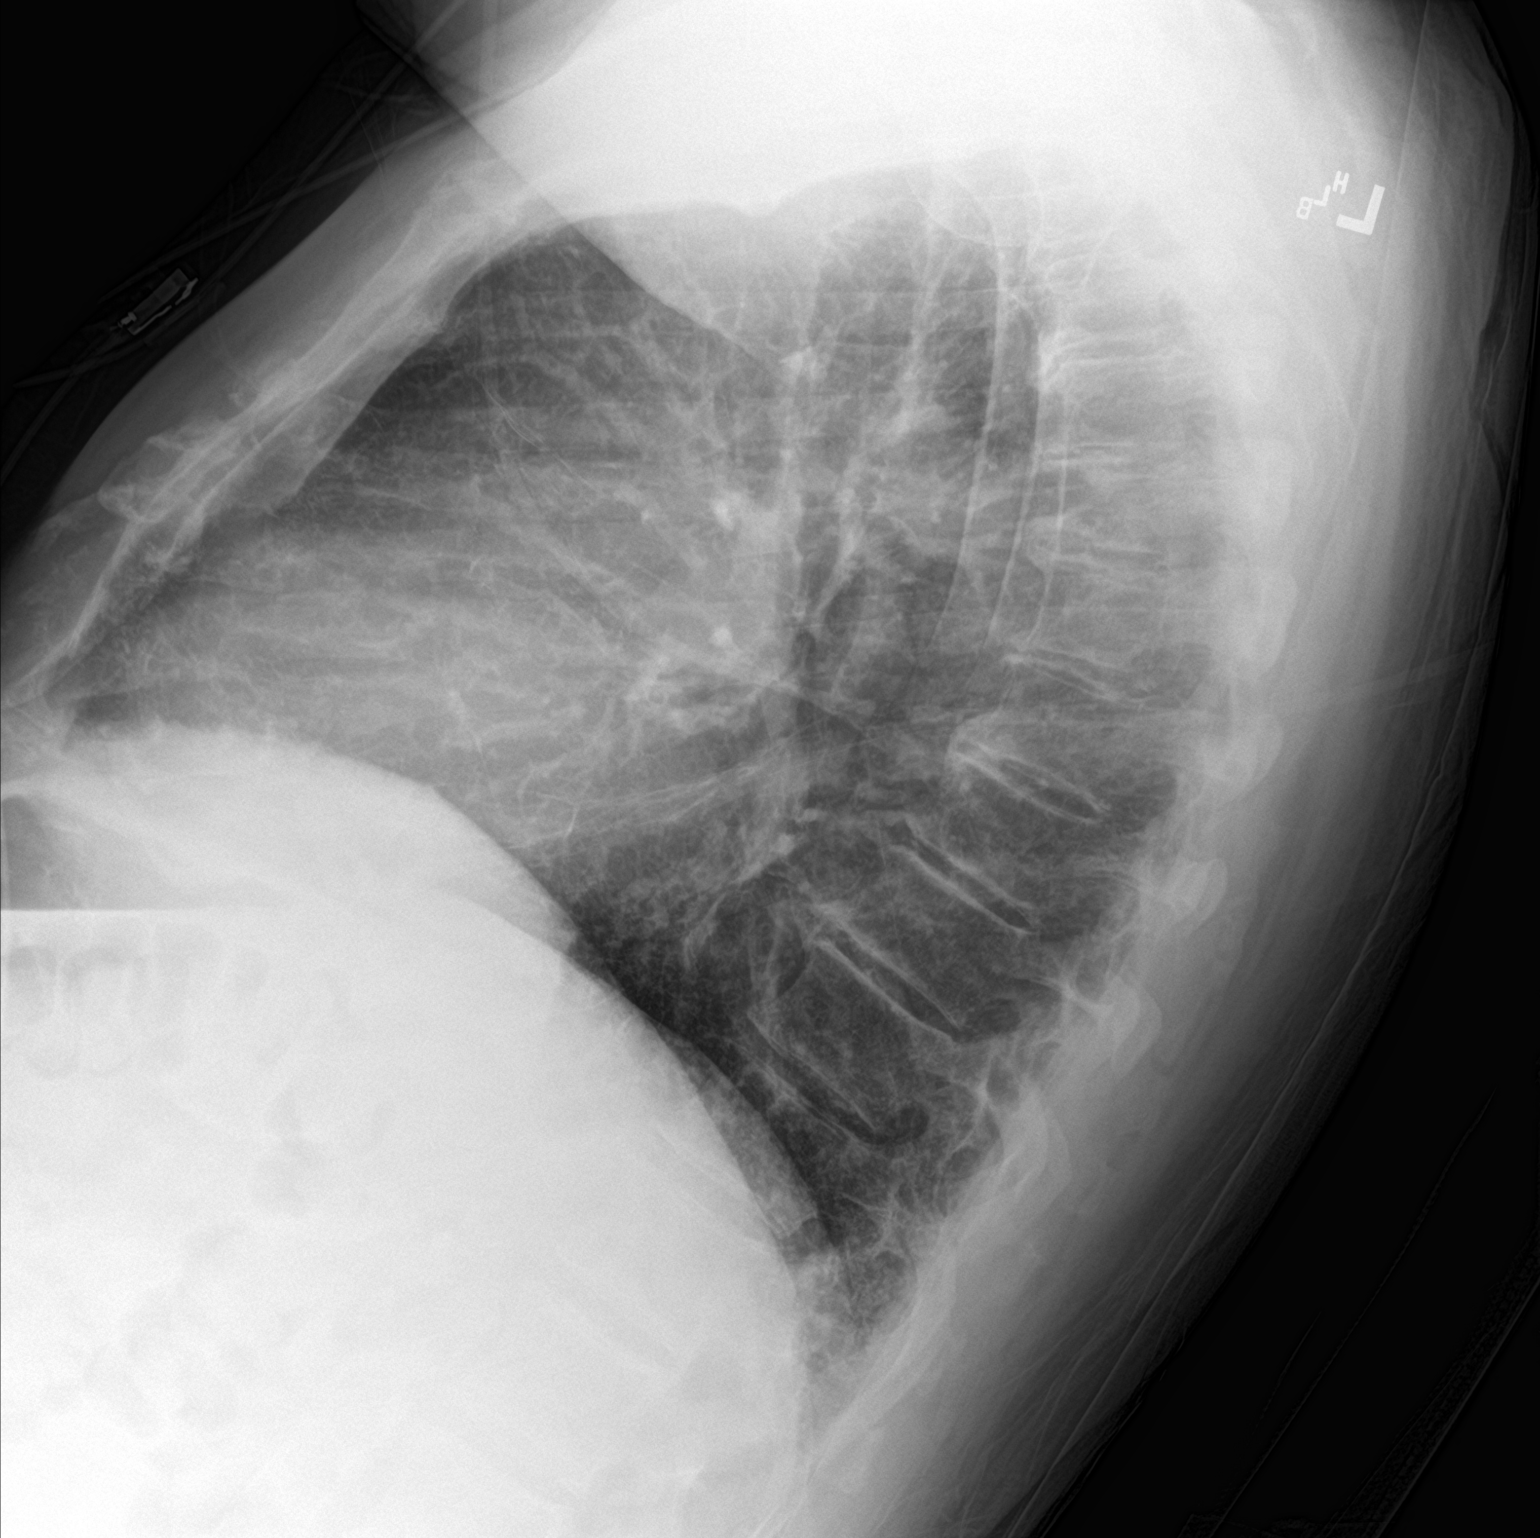

[chest ap]
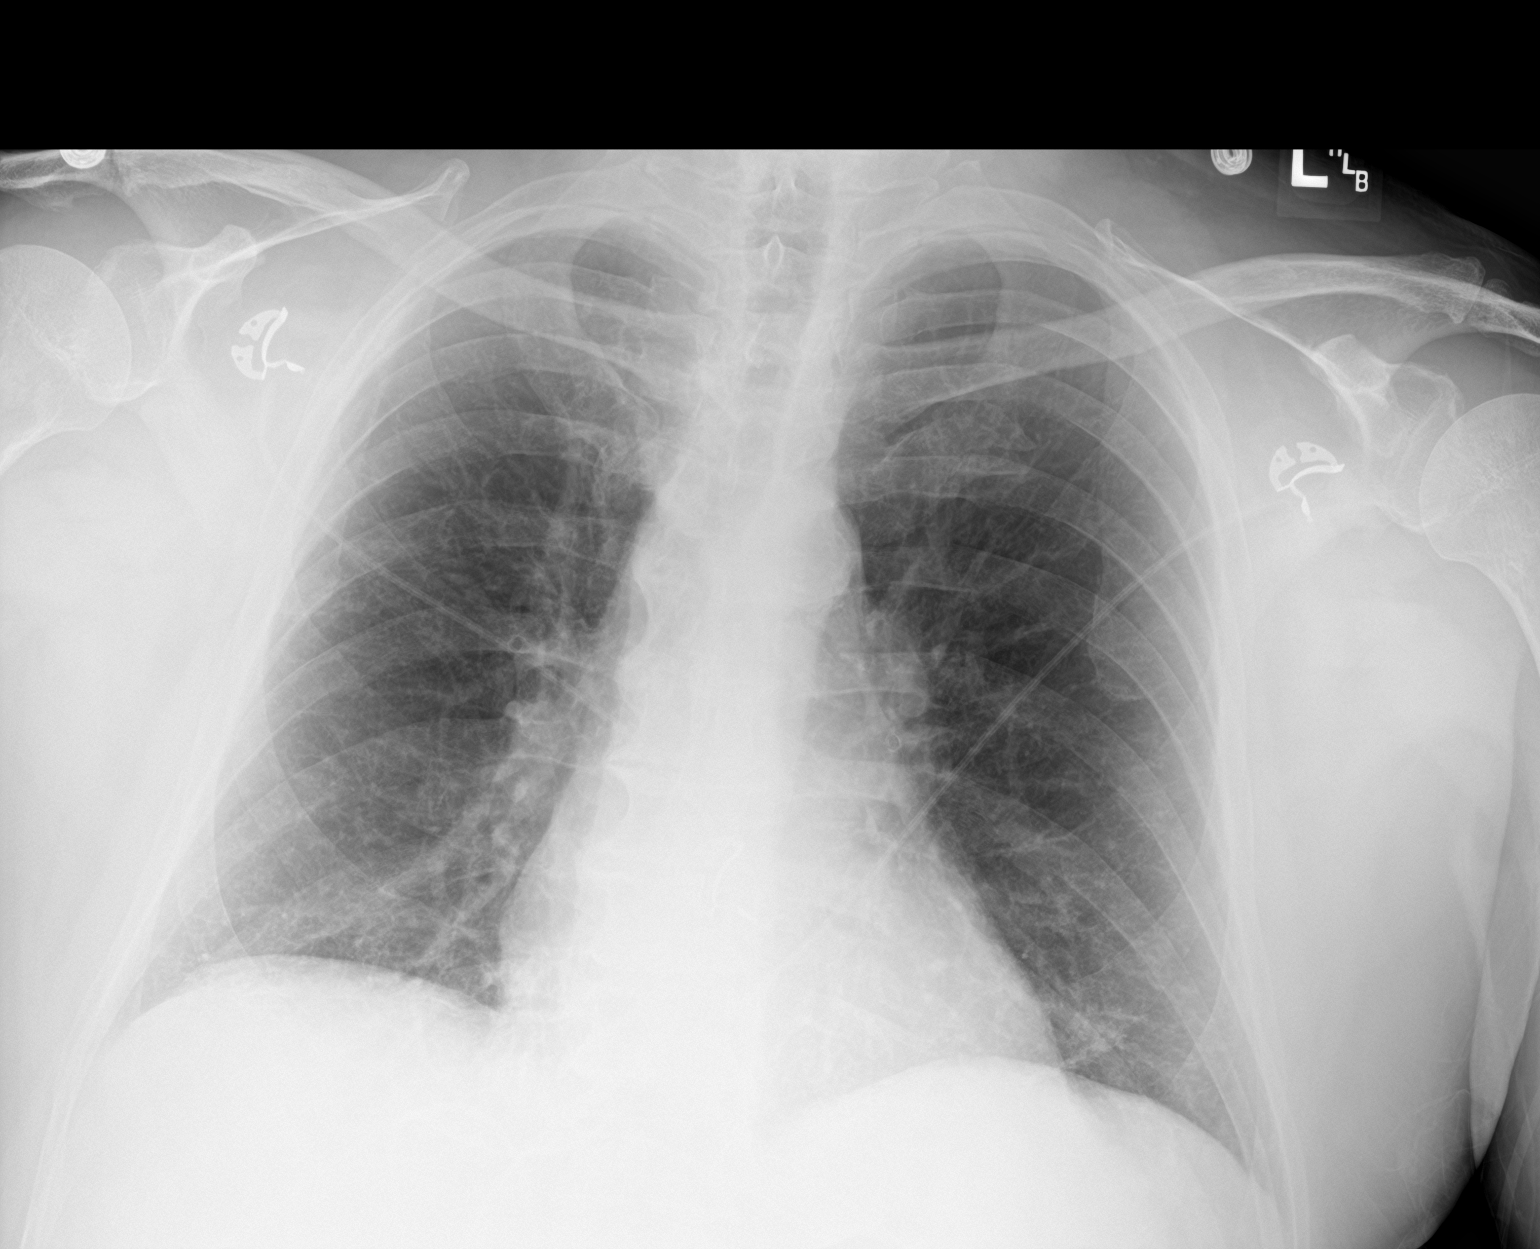

[2 of 2 positions shown; findings below may reference images not displayed]

FINDINGS: The heart size and mediastinal contours are within normal limits.
Both lungs are clear. Disc degenerative disease of the thoracic
spine.
IMPRESSION: No acute abnormality of the lungs.

## 2020-04-23 IMAGING — DX DG CHEST 1V PORT
1 series · 1 of 1 positions shown · non-contrast
Comparison: Chest radiographs 02/11/2020 and earlier.

CLINICAL DATA: 63-year-old male postoperative day zero left carotid
endarterectomy and CABG.

EXAM:
PORTABLE CHEST 1 VIEW

[chest ap]
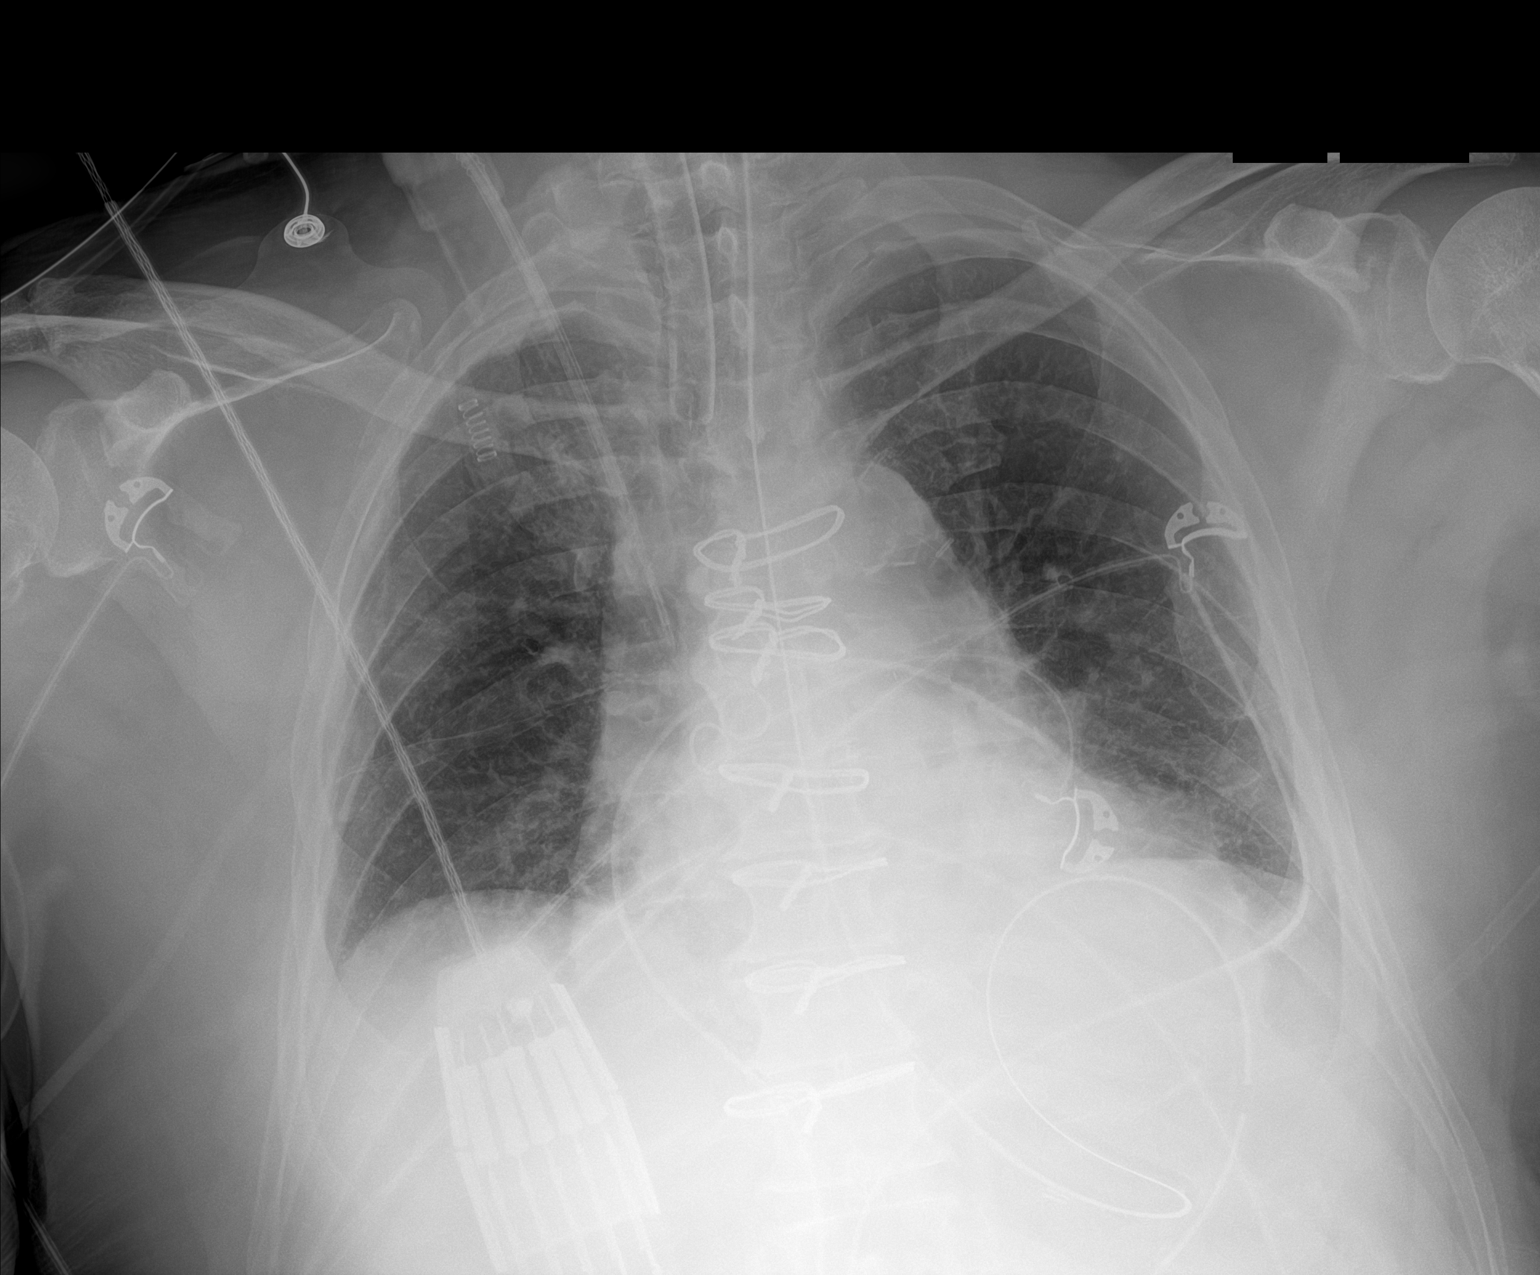

[1 of 1 positions shown; findings below may reference images not displayed]

FINDINGS: Portable AP semi upright view at 1433 hours. Intubated with
endotracheal tube tip just above the clavicles. Enteric tube is in
the left upper quadrant, side hole the level of the gastric body.
Right IJ central line in place, tip at the level of the carina/SVC.

A left chest tube is in place.  No pneumothorax identified.

Lower lung volumes. Sequelae of CABG. Stable cardiac size and
mediastinal contours. No pulmonary edema. No pleural effusion or
confluent opacity is evident.
IMPRESSION: 1. Lines and tubes appear adequately placed. ETT tip just above the
clavicles.
2. No pneumothorax or pulmonary edema.  Lower lung volumes.

## 2020-04-24 ENCOUNTER — Encounter (HOSPITAL_COMMUNITY)
Admission: RE | Admit: 2020-04-24 | Discharge: 2020-04-24 | Disposition: A | Discharge status: Home / Self Care | Payer: 59 | Source: Ambulatory Visit | Attending: Cardiovascular Disease | Admitting: Cardiovascular Disease

## 2020-04-24 ENCOUNTER — Other Ambulatory Visit: Payer: Self-pay

## 2020-04-24 DIAGNOSIS — Z951 Presence of aortocoronary bypass graft: Secondary | ICD-10-CM | POA: Diagnosis not present

## 2020-04-27 ENCOUNTER — Encounter (HOSPITAL_COMMUNITY)
Admission: RE | Admit: 2020-04-27 | Discharge: 2020-04-27 | Disposition: A | Discharge status: Home / Self Care | Payer: 59 | Source: Ambulatory Visit | Attending: Cardiovascular Disease | Admitting: Cardiovascular Disease

## 2020-04-27 ENCOUNTER — Other Ambulatory Visit: Payer: Self-pay

## 2020-04-27 DIAGNOSIS — Z951 Presence of aortocoronary bypass graft: Secondary | ICD-10-CM

## 2020-04-27 NOTE — Progress Notes (Signed)
Cardiac Individual Treatment Plan  Patient Details  Name: HUSAM HOHN MRN: [id number] Date of Birth: Nov 23, 1956 Referring Provider:     CARDIAC REHAB PHASE II ORIENTATION from 03/24/2020 in [place]  Referring Provider  Troy Sine, MD      Initial Encounter Date:    CARDIAC REHAB PHASE II ORIENTATION from 03/24/2020 in [place]  Date  03/24/20      Visit Diagnosis: S/P CABG x 4 02/12/20  S/P L Carotid Endarterctomy  Patient's Home Medications on Admission:  Current Outpatient Medications:  .  aspirin EC 81 MG tablet, Take 81 mg by mouth at bedtime. , Disp: , Rfl:  .  ezetimibe (ZETIA) 10 MG tablet, Take 1 tablet (10 mg total) by mouth daily. (Patient taking differently: Take 10 mg by mouth every evening. ), Disp: 90 tablet, Rfl: 3 .  metoprolol succinate (TOPROL-XL) 50 MG 24 hr tablet, Take 1.5 tablets (75 mg total) by mouth daily., Disp: 135 tablet, Rfl: 3 .  nitroGLYCERIN (NITROSTAT) 0.4 MG SL tablet, Place 1 tablet (0.4 mg total) under the tongue every 5 (five) minutes as needed for chest pain., Disp: 90 tablet, Rfl: 3 .  rosuvastatin (CRESTOR) 40 MG tablet, Take 40 mg by mouth every evening. , Disp: , Rfl:   Past Medical History: Past Medical History:  Diagnosis Date  . Carotid artery occlusion   . Coronary artery disease   . Hyperlipidemia   . Hypertension     Tobacco Use: Social History   Tobacco Use  Smoking Status Never Smoker  Smokeless Tobacco Never Used    Labs: Recent Review Flowsheet Data    Labs for ITP Cardiac and Pulmonary Rehab Latest Ref Rng & Units 02/12/2020 02/12/2020 02/13/2020 02/13/2020 03/30/2020   Cholestrol 100 - 199 mg/dL - - - - 128   LDLCALC 0 - 99 mg/dL - - - - 62   HDL >39 mg/dL - - - - 49   Trlycerides 0 - 149 mg/dL - - - - 91   Hemoglobin A1c 4.8 - 5.6 % - - - - -   PHART 7.350 - 7.450 7.299(L) 7.349(L) 7.315(L) 7.455(H) -   PCO2ART 32.0 - 48.0 mmHg 49.6(H)  48.0 46.0 33.9 -   HCO3 20.0 - 28.0 mmol/L 24.3 26.4 23.3 23.9 -   TCO2 22 - 32 mmol/L _0 -   ACIDBASEDEF 0.0 - 2.0 mmol/L 2.0 - 3.0(H) - -   O2SAT % 97.0 100.0 100.0 100.0 -      Capillary Blood Glucose: Lab Results  Component Value Date   GLUCAP 134 (H) 02/16/2020   GLUCAP 118 (H) 02/16/2020   GLUCAP 103 (H) 02/16/2020   GLUCAP 95 02/15/2020   GLUCAP 144 (H) 02/15/2020     Exercise Target Goals: Exercise Program Goal: Individual exercise prescription set using results from initial 6 min walk test and THRR while considering  patient's activity barriers and safety.   Exercise Prescription Goal: Initial exercise prescription builds to 30-45 minutes a day of aerobic activity, 2-3 days per week.  Home exercise guidelines will be given to patient during program as part of exercise prescription that the participant will acknowledge.  Activity Barriers & Risk Stratification: Activity Barriers & Cardiac Risk Stratification - 03/24/20 0854      Activity Barriers & Cardiac Risk Stratification   Activity Barriers  None    Cardiac Risk Stratification  High       6 Minute  Walk: 6 Minute Walk    Row Name 03/24/20 0902         6 Minute Walk   Phase  Initial     Distance  1868 feet     Walk Time  6 minutes     # of Rest Breaks  0     MPH  3.54     METS  4.53     RPE  11     Perceived Dyspnea   0     VO2 Peak  15.86     Symptoms  No     Resting HR  74 bpm     Resting BP  140/80     Resting Oxygen Saturation   99 %     Exercise Oxygen Saturation  during 6 min walk  97 %     Max Ex. HR  103 bpm     Max Ex. BP  174/62     2 Minute Post BP  138/80        Oxygen Initial Assessment:   Oxygen Re-Evaluation:   Oxygen Discharge (Final Oxygen Re-Evaluation):   Initial Exercise Prescription: Initial Exercise Prescription - 03/24/20 1000      Date of Initial Exercise RX and Referring Provider   Date  03/24/20    Referring Provider  Troy Sine, MD     Expected Discharge Date  05/22/20      Treadmill   MPH  3.2    Grade  1    Minutes  15    METs  3.89      NuStep   Level  4    SPM  85    Minutes  15    METs  3      Prescription Details   Frequency (times per week)  3    Duration  Progress to 30 minutes of continuous aerobic without signs/symptoms of physical distress      Intensity   THRR 40-80% of Max Heartrate  62-125    Ratings of Perceived Exertion  11-13    Perceived Dyspnea  0-4      Progression   Progression  Continue to progress workloads to maintain intensity without signs/symptoms of physical distress.      Resistance Training   Training Prescription  Yes    Weight  4lbs    Reps  10-15       Perform Capillary Blood Glucose checks as needed.  Exercise Prescription Changes:  Exercise Prescription Changes    Row Name 04/01/20 0708 04/20/20 0709 04/27/20 0708         Response to Exercise   Blood Pressure (Admit)  118/64  112/68  108/68     Blood Pressure (Exercise)  148/66  124/60  138/70     Blood Pressure (Exit)  118/64  108/62  112/66     Heart Rate (Admit)  68 bpm  69 bpm  71 bpm     Heart Rate (Exercise)  97 bpm  95 bpm  93 bpm     Heart Rate (Exit)  73 bpm  77 bpm  76 bpm     Rating of Perceived Exertion (Exercise)  _0 Symptoms  none  none  none     Comments  Off to a good start with exercise.  -  -     Duration  Continue with 30 min of aerobic exercise without signs/symptoms of physical distress.  Continue with  30 min of aerobic exercise without signs/symptoms of physical distress.  Continue with 30 min of aerobic exercise without signs/symptoms of physical distress.     Intensity  THRR unchanged  THRR unchanged  THRR unchanged       Progression   Progression  Continue to progress workloads to maintain intensity without signs/symptoms of physical distress.  Continue to progress workloads to maintain intensity without signs/symptoms of physical distress.  Continue to progress workloads  to maintain intensity without signs/symptoms of physical distress.     Average METs  3.2  4  4.3       Resistance Training   Training Prescription  No  Yes  Yes     Weight  -  5lbs  6lbs     Reps  -  10-15  10-15     Time  -  10 Minutes  10 Minutes       Interval Training   Interval Training  No  No  No       Treadmill   MPH  3.2  3.2  3.2     Grade  _0 Minutes  _1 METs  3.89  4.33  4.33       NuStep   Level  _2 SPM  85  85  85     Minutes  _3 METs  2.6  3.6  4.3       Home Exercise Plan   Plans to continue exercise at  -  Home (comment) Walking  Home (comment) Walking     Frequency  -  Add 4 additional days to program exercise sessions.  Add 4 additional days to program exercise sessions.     Initial Home Exercises Provided  -  04/08/20  04/08/20        Exercise Comments:  Exercise Comments    Row Name 04/01/20 0757 04/08/20 0702 04/27/20 0710 04/29/20 0710     Exercise Comments  Patient tolerated 1st session of exercise well without symptoms.  Reviewed home exercise guidelines with patient.  Reviewed goals with patient.  Reviewed METs with patient.       Exercise Goals and Review:  Exercise Goals    Row Name 03/24/20 0854             Exercise Goals   Increase Physical Activity  Yes       Intervention  Provide advice, education, support and counseling about physical activity/exercise needs.;Develop an individualized exercise prescription for aerobic and resistive training based on initial evaluation findings, risk stratification, comorbidities and participant's personal goals.       Expected Outcomes  Short Term: Attend rehab on a regular basis to increase amount of physical activity.;Long Term: Exercising regularly at least 3-5 days a week.;Long Term: Add in home exercise to make exercise part of routine and to increase amount of physical activity.       Increase Strength and Stamina  Yes       Intervention  Provide  advice, education, support and counseling about physical activity/exercise needs.;Develop an individualized exercise prescription for aerobic and resistive training based on initial evaluation findings, risk stratification, comorbidities and participant's personal goals.       Expected Outcomes  Short Term: Increase workloads from initial exercise prescription for resistance, speed, and METs.;Short Term: Perform resistance training exercises  routinely during rehab and add in resistance training at home;Long Term: Improve cardiorespiratory fitness, muscular endurance and strength as measured by increased METs and functional capacity (6MWT)       Able to understand and use rate of perceived exertion (RPE) scale  Yes       Intervention  Provide education and explanation on how to use RPE scale       Expected Outcomes  Short Term: Able to use RPE daily in rehab to express subjective intensity level;Long Term:  Able to use RPE to guide intensity level when exercising independently       Knowledge and understanding of Target Heart Rate Range (THRR)  Yes       Intervention  Provide education and explanation of THRR including how the numbers were predicted and where they are located for reference       Expected Outcomes  Short Term: Able to state/look up THRR;Long Term: Able to use THRR to govern intensity when exercising independently;Short Term: Able to use daily as guideline for intensity in rehab       Able to check pulse independently  Yes       Intervention  Provide education and demonstration on how to check pulse in carotid and radial arteries.;Review the importance of being able to check your own pulse for safety during independent exercise       Expected Outcomes  Short Term: Able to explain why pulse checking is important during independent exercise;Long Term: Able to check pulse independently and accurately       Understanding of Exercise Prescription  Yes       Intervention  Provide education,  explanation, and written materials on patient's individual exercise prescription       Expected Outcomes  Short Term: Able to explain program exercise prescription;Long Term: Able to explain home exercise prescription to exercise independently          Exercise Goals Re-Evaluation : Exercise Goals Re-Evaluation    Row Name 04/01/20 0757 04/08/20 0702 04/27/20 0710         Exercise Goal Re-Evaluation   Exercise Goals Review  Increase Physical Activity;Able to understand and use rate of perceived exertion (RPE) scale  Increase Physical Activity;Able to understand and use rate of perceived exertion (RPE) scale;Knowledge and understanding of Target Heart Rate Range (THRR);Able to check pulse independently;Understanding of Exercise Prescription;Increase Strength and Stamina  Increase Physical Activity;Able to understand and use rate of perceived exertion (RPE) scale;Knowledge and understanding of Target Heart Rate Range (THRR);Able to check pulse independently;Understanding of Exercise Prescription;Increase Strength and Stamina     Comments  Patient able to understand and use RPE scale appropriately.  Reviewed home exercise guidelines with patient including endpoints, temperature precautions, target heart rate and rate of perceived exertion. Pt is walking 1.5-1.6 miles in about 30 minutes 3-4 days/week as his mode of home exercise. Pt knows how to manually count his pulse. Pt voices understanding of instructions given.  Patient states he is continuing his same exercise routine 30 minutes walking. Patient is progressing well with exercise.     Expected Outcomes  Increase workloads as tolerated to help achieve personal health and  fitness goals.  Patient will continue daily exercise routine to help achieve personal health and fitness goals.  Continue daily exercise routine.        Discharge Exercise Prescription (Final Exercise Prescription Changes): Exercise Prescription Changes - 04/27/20 0708       Response to Exercise   Blood Pressure (Admit)  108/68    Blood Pressure (Exercise)  138/70    Blood Pressure (Exit)  112/66    Heart Rate (Admit)  71 bpm    Heart Rate (Exercise)  93 bpm    Heart Rate (Exit)  76 bpm    Rating of Perceived Exertion (Exercise)  12    Symptoms  none    Duration  Continue with 30 min of aerobic exercise without signs/symptoms of physical distress.    Intensity  THRR unchanged      Progression   Progression  Continue to progress workloads to maintain intensity without signs/symptoms of physical distress.    Average METs  4.3      Resistance Training   Training Prescription  Yes    Weight  6lbs    Reps  10-15    Time  10 Minutes      Interval Training   Interval Training  No      Treadmill   MPH  3.2    Grade  2    Minutes  15    METs  4.33      NuStep   Level  4    SPM  85    Minutes  15    METs  4.3      Home Exercise Plan   Plans to continue exercise at  Home (comment)   Walking   Frequency  Add 4 additional days to program exercise sessions.    Initial Home Exercises Provided  04/08/20       Nutrition:  Target Goals: Understanding of nutrition guidelines, daily intake of sodium '1500mg'$ , cholesterol '200mg'$ , calories 30% from fat and 7% or less from saturated fats, daily to have 5 or more servings of fruits and vegetables.  Biometrics: Pre Biometrics - 03/24/20 0853      Pre Biometrics   Height  5' 9.75" (1.772 m)    Weight  90.8 kg    Waist Circumference  41.5 inches    Hip Circumference  41.5 inches    Waist to Hip Ratio  1 %    BMI (Calculated)  28.92    Triceps Skinfold  10 mm    % Body Fat  26.8 %    Grip Strength  44 kg    Flexibility  14 in    Single Leg Stand  22.56 seconds        Nutrition Therapy Plan and Nutrition Goals: Nutrition Therapy & Goals - 04/06/20 0756      Nutrition Therapy   Diet  Heart Healthy      Personal Nutrition Goals   Nutrition Goal  Pt to build a healthy plate including vegetables,  fruits, whole grains, and low-fat dairy products in a heart healthy meal plan.      Intervention Plan   Intervention  Prescribe, educate and counsel regarding individualized specific dietary modifications aiming towards targeted core components such as weight, hypertension, lipid management, diabetes, heart failure and other comorbidities.    Expected Outcomes  Short Term Goal: Understand basic principles of dietary content, such as calories, fat, sodium, cholesterol and nutrients.       Nutrition Assessments: Nutrition Assessments - 03/24/20 1405      MEDFICTS Scores   Pre Score  44       Nutrition Goals Re-Evaluation: Nutrition Goals Re-Evaluation    [place] Name 04/06/20 0756             Goals   Current Weight  200 lb (90.7 kg)  Expected Outcome  Maintain current heart healthy diet          Nutrition Goals Re-Evaluation: Nutrition Goals Re-Evaluation    Row Name 04/06/20 0756             Goals   Current Weight  200 lb (90.7 kg)       Expected Outcome  Maintain current heart healthy diet          Nutrition Goals Discharge (Final Nutrition Goals Re-Evaluation): Nutrition Goals Re-Evaluation - 04/06/20 0756      Goals   Current Weight  200 lb (90.7 kg)    Expected Outcome  Maintain current heart healthy diet       Psychosocial: Target Goals: Acknowledge presence or absence of significant depression and/or stress, maximize coping skills, provide positive support system. Participant is able to verbalize types and ability to use techniques and skills needed for reducing stress and depression.  Initial Review & Psychosocial Screening: Initial Psych Review & Screening - 03/24/20 0928      Initial Review   Current issues with  None Identified      Family Dynamics   Good Support System?  Yes   Richardson Landry lives alone and has his girlfirend and children forn support   Comments  Steve's wife passed away from multiple myeloma 6 years ago. Richardson Landry has support of his  girfriend and adult children      Barriers   Psychosocial barriers to participate in program  There are no identifiable barriers or psychosocial needs.      Screening Interventions   Interventions  Encouraged to exercise       Quality of Life Scores: Quality of Life - 03/24/20 0854      Quality of Life   Select  Quality of Life      Quality of Life Scores   Health/Function Pre  29.2 %    Socioeconomic Pre  30 %    Psych/Spiritual Pre  30 %    Family Pre  30 %    GLOBAL Pre  29.65 %      Scores of 19 and below usually indicate a poorer quality of life in these areas.  A difference of  2-3 points is a clinically meaningful difference.  A difference of 2-3 points in the total score of the Quality of Life Index has been associated with significant improvement in overall quality of life, self-image, physical symptoms, and general health in studies assessing change in quality of life.  PHQ-9: Recent Review Flowsheet Data    Depression screen [facility] 2/9 03/24/2020   Decreased Interest 0   Down, Depressed, Hopeless 0   PHQ - 2 Score 0     Interpretation of Total Score  Total Score Depression Severity:  1-4 = Minimal depression, 5-9 = Mild depression, 10-14 = Moderate depression, 15-19 = Moderately severe depression, 20-27 = Severe depression   Psychosocial Evaluation and Intervention: Psychosocial Evaluation - 04/03/20 1031      Psychosocial Evaluation & Interventions   Interventions  Encouraged to exercise with the program and follow exercise prescription    Comments  Mr. Hosick has a positive attitude attitude and outlook. He denies psychosocial barriers to participation in CR. He is a widower of several years but is in a new relationship. His girlfriend is a big part of his support system along with his two children. He enjoys playing his guitar in the praise and worship band at his church and playing golf. He uses these hobbies to  deal with any stress that may arise    Expected  Outcomes  Mr. Virgil will continue to have a positive outlook and attitude.    Continue Psychosocial Services   No Follow up required       Psychosocial Re-Evaluation: Psychosocial Re-Evaluation    [place] Name 04/27/20 1141             Psychosocial Re-Evaluation   Current issues with  None Identified       Comments  Mr. Copeman has a positive attitude attitude and outlook. He denies psychosocial barriers to participation in CR. He is a widower of several years but is in a new relationship. His girlfriend is a big part of his support system along with his two children. He enjoys playing his guitar in the praise and worship band at his church and playing golf. He uses these hobbies to deal with any stress that may arise       Expected Outcomes  Mr. Theiler will continue to have a positive attitude and outlook. He will continue to utilize his support system as needs arise. He will continue to enjoy his hobbies and use as a healthy way to relieve stress.       Interventions  Encouraged to attend Cardiac Rehabilitation for the exercise       Continue Psychosocial Services   No Follow up required          Psychosocial Discharge (Final Psychosocial Re-Evaluation): Psychosocial Re-Evaluation - 04/27/20 1141      Psychosocial Re-Evaluation   Current issues with  None Identified    Comments  Mr. Lard has a positive attitude attitude and outlook. He denies psychosocial barriers to participation in CR. He is a widower of several years but is in a new relationship. His girlfriend is a big part of his support system along with his two children. He enjoys playing his guitar in the praise and worship band at his church and playing golf. He uses these hobbies to deal with any stress that may arise    Expected Outcomes  Mr. Raper will continue to have a positive attitude and outlook. He will continue to utilize his support system as needs arise. He will continue to enjoy his hobbies and use as  a healthy way to relieve stress.    Interventions  Encouraged to attend Cardiac Rehabilitation for the exercise    Continue Psychosocial Services   No Follow up required       Vocational Rehabilitation: Provide vocational rehab assistance to qualifying candidates.   Vocational Rehab Evaluation & Intervention: Vocational Rehab - 03/24/20 0942      Initial Vocational Rehab Evaluation & Intervention   Assessment shows need for Vocational Rehabilitation  No   Richardson Landry works full time and does not need vocational rehab at this time      Education: Education Goals: Education classes will be provided on a weekly basis, covering required topics. Participant will state understanding/return demonstration of topics presented.  Learning Barriers/Preferences: Learning Barriers/Preferences - 03/24/20 1139      Learning Barriers/Preferences   Learning Barriers  None    Learning Preferences  Audio;Group Instruction;Individual Instruction;Pictoral;Skilled Demonstration;Verbal Instruction;Video;Written Material       Education Topics: Count Your Pulse:  -Group instruction provided by verbal instruction, demonstration, patient participation and written materials to support subject.  Instructors address importance of being able to find your pulse and how to count your pulse when at home without a heart monitor.  Patients get hands on experience  counting their pulse with staff help and individually.   Heart Attack, Angina, and Risk Factor Modification:  -Group instruction provided by verbal instruction, video, and written materials to support subject.  Instructors address signs and symptoms of angina and heart attacks.    Also discuss risk factors for heart disease and how to make changes to improve heart health risk factors.   Functional Fitness:  -Group instruction provided by verbal instruction, demonstration, patient participation, and written materials to support subject.  Instructors address  safety measures for doing things around the house.  Discuss how to get up and down off the floor, how to pick things up properly, how to safely get out of a chair without assistance, and balance training.   Meditation and Mindfulness:  -Group instruction provided by verbal instruction, patient participation, and written materials to support subject.  Instructor addresses importance of mindfulness and meditation practice to help reduce stress and improve awareness.  Instructor also leads participants through a meditation exercise.    Stretching for Flexibility and Mobility:  -Group instruction provided by verbal instruction, patient participation, and written materials to support subject.  Instructors lead participants through series of stretches that are designed to increase flexibility thus improving mobility.  These stretches are additional exercise for major muscle groups that are typically performed during regular warm up and cool down.   Hands Only CPR:  -Group verbal, video, and participation provides a basic overview of AHA guidelines for community CPR. Role-play of emergencies allow participants the opportunity to practice calling for help and chest compression technique with discussion of AED use.   Hypertension: -Group verbal and written instruction that provides a basic overview of hypertension including the most recent diagnostic guidelines, risk factor reduction with self-care instructions and medication management.    Nutrition I class: Heart Healthy Eating:  -Group instruction provided by PowerPoint slides, verbal discussion, and written materials to support subject matter. The instructor gives an explanation and review of the Therapeutic Lifestyle Changes diet recommendations, which includes a discussion on lipid goals, dietary fat, sodium, fiber, plant stanol/sterol esters, sugar, and the components of a well-balanced, healthy diet.   Nutrition II class: Lifestyle Skills:   -Group instruction provided by PowerPoint slides, verbal discussion, and written materials to support subject matter. The instructor gives an explanation and review of label reading, grocery shopping for heart health, heart healthy recipe modifications, and ways to make healthier choices when eating out.   Diabetes Question & Answer:  -Group instruction provided by PowerPoint slides, verbal discussion, and written materials to support subject matter. The instructor gives an explanation and review of diabetes co-morbidities, pre- and post-prandial blood glucose goals, pre-exercise blood glucose goals, signs, symptoms, and treatment of hypoglycemia and hyperglycemia, and foot care basics.   Diabetes Blitz:  -Group instruction provided by PowerPoint slides, verbal discussion, and written materials to support subject matter. The instructor gives an explanation and review of the physiology behind type 1 and type 2 diabetes, diabetes medications and rational behind using different medications, pre- and post-prandial blood glucose recommendations and Hemoglobin A1c goals, diabetes diet, and exercise including blood glucose guidelines for exercising safely.    Portion Distortion:  -Group instruction provided by PowerPoint slides, verbal discussion, written materials, and food models to support subject matter. The instructor gives an explanation of serving size versus portion size, changes in portions sizes over the last 20 years, and what consists of a serving from each food group.   Stress Management:  -Group instruction provided by verbal  instruction, video, and written materials to support subject matter.  Instructors review role of stress in heart disease and how to cope with stress positively.     Exercising on Your Own:  -Group instruction provided by verbal instruction, power point, and written materials to support subject.  Instructors discuss benefits of exercise, components of exercise,  frequency and intensity of exercise, and end points for exercise.  Also discuss use of nitroglycerin and activating EMS.  Review options of places to exercise outside of rehab.  Review guidelines for sex with heart disease.   Cardiac Drugs I:  -Group instruction provided by verbal instruction and written materials to support subject.  Instructor reviews cardiac drug classes: antiplatelets, anticoagulants, beta blockers, and statins.  Instructor discusses reasons, side effects, and lifestyle considerations for each drug class.   Cardiac Drugs II:  -Group instruction provided by verbal instruction and written materials to support subject.  Instructor reviews cardiac drug classes: angiotensin converting enzyme inhibitors (ACE-I), angiotensin II receptor blockers (ARBs), nitrates, and calcium channel blockers.  Instructor discusses reasons, side effects, and lifestyle considerations for each drug class.   Anatomy and Physiology of the Circulatory System:  Group verbal and written instruction and models provide basic cardiac anatomy and physiology, with the coronary electrical and arterial systems. Review of: AMI, Angina, Valve disease, Heart Failure, Peripheral Artery Disease, Cardiac Arrhythmia, Pacemakers, and the ICD.   Other Education:  -Group or individual verbal, written, or video instructions that support the educational goals of the cardiac rehab program.   Holiday Eating Survival Tips:  -Group instruction provided by PowerPoint slides, verbal discussion, and written materials to support subject matter. The instructor gives patients tips, tricks, and techniques to help them not only survive but enjoy the holidays despite the onslaught of food that accompanies the holidays.   Knowledge Questionnaire Score: Knowledge Questionnaire Score - 03/24/20 1026      Knowledge Questionnaire Score   Pre Score  21/24       Core Components/Risk Factors/Patient Goals at Admission: Personal Goals  and Risk Factors at Admission - 03/24/20 1140      Core Components/Risk Factors/Patient Goals on Admission    Weight Management  Yes;Weight Maintenance    Intervention  Weight Management: Develop a combined nutrition and exercise program designed to reach desired caloric intake, while maintaining appropriate intake of nutrient and fiber, sodium and fats, and appropriate energy expenditure required for the weight goal.;Weight Management: Provide education and appropriate resources to help participant work on and attain dietary goals.    Expected Outcomes  Weight Maintenance: Understanding of the daily nutrition guidelines, which includes 25-35% calories from fat, 7% or less cal from saturated fats, less than 279m cholesterol, less than 1.5gm of sodium, & 5 or more servings of fruits and vegetables daily;Weight Loss: Understanding of general recommendations for a balanced deficit meal plan, which promotes 1-2 lb weight loss per week and includes a negative energy balance of [phone] kcal/d    Hypertension  Yes    Intervention  Provide education on lifestyle modifcations including regular physical activity/exercise, weight management, moderate sodium restriction and increased consumption of fresh fruit, vegetables, and low fat dairy, alcohol moderation, and smoking cessation.;Monitor prescription use compliance.    Expected Outcomes  Short Term: Continued assessment and intervention until BP is < 140/972mHG in hypertensive participants. < 130/8057mG in hypertensive participants with diabetes, heart failure or chronic kidney disease.;Long Term: Maintenance of blood pressure at goal levels.    Lipids  Yes  Intervention  Provide education and support for participant on nutrition & aerobic/resistive exercise along with prescribed medications to achieve LDL <29m, HDL >459m    Expected Outcomes  Short Term: Participant states understanding of desired cholesterol values and is compliant with medications  prescribed. Participant is following exercise prescription and nutrition guidelines.;Long Term: Cholesterol controlled with medications as prescribed, with individualized exercise RX and with personalized nutrition plan. Value goals: LDL < 7051mHDL > 40 mg.       Core Components/Risk Factors/Patient Goals Review:  Goals and Risk Factor Review    Row Name 04/03/20 1038 04/27/20 1142           Core Components/Risk Factors/Patient Goals Review   Personal Goals Review  Weight Management/Obesity;Hypertension;Lipids  Weight Management/Obesity;Hypertension;Lipids      Review  Mr. MidSchwankes multiple CAD risk factors. He is eager to participate in cardiac rehab for risk factor modification. His goals are to regain his stamina and strength, have less sternal soreness and to maintain/improve his engergy to do ADLS  Mr. MidCaporales multiple CAD risk factors. He continues to be eager to participate in cardiac rehab for risk factor modification. He is tolerating workload increases and always workes to an RPE of 11-13. He feels he is on track to meet his goals which are to increase his stamina and strength and maintain the energy needed to do ADLs      Expected Outcomes  Continue to participate in CR for risk factor reduction  Continue to participate in CR for risk factor reduction and maintain his home exercise plan         Core Components/Risk Factors/Patient Goals at Discharge (Final Review):  Goals and Risk Factor Review - 04/27/20 1142      Core Components/Risk Factors/Patient Goals Review   Personal Goals Review  Weight Management/Obesity;Hypertension;Lipids    Review  Mr. MidMcenroes multiple CAD risk factors. He continues to be eager to participate in cardiac rehab for risk factor modification. He is tolerating workload increases and always workes to an RPE of 11-13. He feels he is on track to meet his goals which are to increase his stamina and strength and maintain the energy needed to do  ADLs    Expected Outcomes  Continue to participate in CR for risk factor reduction and maintain his home exercise plan       ITP Comments: ITP Comments    Row Name 03/24/20 1129 04/01/20 1800 04/27/20 1139       ITP Comments  Dr TraFransico Him, Medical Director  Mr. MidTawilmpleted is first exercise session today and tolerate well. VSS. Denied complaints. Worked to an RPE of 10 to 12.  30 day ITP review: Mr. MidCertain doing very well in CR. He is tolerating workload increases and is self motivated. He has recently returned to work post hospitalization, however he is working out of his home. He feels his stamina and strength are increasing.        Comments: see ITP comments

## 2020-04-29 ENCOUNTER — Encounter (HOSPITAL_COMMUNITY)
Admission: RE | Admit: 2020-04-29 | Discharge: 2020-04-29 | Disposition: A | Discharge status: Home / Self Care | Payer: 59 | Source: Ambulatory Visit | Attending: Cardiovascular Disease | Admitting: Cardiovascular Disease

## 2020-04-29 ENCOUNTER — Ambulatory Visit (INDEPENDENT_AMBULATORY_CARE_PROVIDER_SITE_OTHER): Payer: 59 | Admitting: Cardiovascular Disease

## 2020-04-29 ENCOUNTER — Encounter: Payer: Self-pay | Admitting: Cardiovascular Disease

## 2020-04-29 ENCOUNTER — Other Ambulatory Visit: Payer: Self-pay

## 2020-04-29 DIAGNOSIS — Z951 Presence of aortocoronary bypass graft: Secondary | ICD-10-CM

## 2020-04-29 DIAGNOSIS — E785 Hyperlipidemia, unspecified: Secondary | ICD-10-CM

## 2020-04-29 DIAGNOSIS — I10 Essential (primary) hypertension: Secondary | ICD-10-CM

## 2020-04-29 DIAGNOSIS — I480 Paroxysmal atrial fibrillation: Secondary | ICD-10-CM

## 2020-04-29 DIAGNOSIS — I25119 Atherosclerotic heart disease of native coronary artery with unspecified angina pectoris: Secondary | ICD-10-CM

## 2020-04-29 DIAGNOSIS — I209 Angina pectoris, unspecified: Secondary | ICD-10-CM

## 2020-04-29 DIAGNOSIS — R9431 Abnormal electrocardiogram [ECG] [EKG]: Secondary | ICD-10-CM

## 2020-04-29 MED ORDER — METOPROLOL SUCCINATE ER 50 MG PO TB24
75.0000 mg | ORAL_TABLET | Freq: Every day | ORAL | 3 refills | Status: DC
Start: 2020-04-29 — End: 2021-04-14

## 2020-04-29 NOTE — Patient Instructions (Signed)
Medication Instructions:  DECREASE YOUR AMIODARONE TO 100MG  FOR 2 WEEKS (1/2 TAB) THEN STOP THE AMIODARONE ALTOGETHER.  IN 2 WEEKS, INCREASE YOUR TOPROL TO 75MG  DAILY (1.5 TABS)  *If you need a refill on your cardiac medications before your next appointment, please call your pharmacy*   Testing/Procedures: Your physician has requested that you have an echocardiogram. Echocardiography is a painless test that uses sound waves to create images of your heart. It provides your doctor with information about the size and shape of your heart and how well your heart's chambers and valves are working. This procedure takes approximately one hour. There are no restrictions for this procedure.  [place]   Follow-Up: At [facility], you and your health needs are our priority.  As part of our continuing mission to provide you with exceptional heart care, we have created designated Provider Care Teams.  These Care Teams include your primary Cardiologist (physician) and Advanced Practice Providers (APPs -  Physician Assistants and Nurse Practitioners) who all work together to provide you with the care you need, when you need it.  We recommend signing up for the patient portal called "MyChart".  Sign up information is provided on this After Visit Summary.  MyChart is used to connect with patients for Virtual Visits (Telemedicine).  Patients are able to view lab/test results, encounter notes, upcoming appointments, etc.  Non-urgent messages can be sent to your provider as well.   To learn more about what you can do with MyChart, go to [email or web address].    Your next appointment:   3 month(s)  The format for your next appointment:   In Person  Provider:   Shelva Majestic, MD

## 2020-04-29 NOTE — Progress Notes (Signed)
Cardiology Office Note    Date:  05/06/2020   ID:  CELESTER LECH, DOB 1956/11/01, MRN [id number]  PCP: Dr. Marijo File Cardiologist:  Shelva Majestic, MD   F/U cardiology evaluation initially sent through the courtesy of Dr. Marijo File for evaluation of exertional chest discomfort.  History of Present Illness:  NECO KLING is a 64 y.o. male who was initially referred for through the courtesy of Dr. Marijo File for exertional chest pressure.  He was last seen on February 05, 2020.  He presents to the office today in follow-up after his CABG revascularization surgery.  Mr. Baumgartner has a history of hypertension as well as hyperlipidemia.  In November 2018, LDL cholesterol was 264 and apparently was against statin therapy.  He states that over 10 years ago he did experience some chest discomfort and apparently had a stress test.  He was told that this was normal but never saw a cardiologist.  The patient has issues with bone spurs involving his cervical and thoracic spine.  He states that around Thanksgiving he became upset and apparently passed out for short duration.  He did note chest pressure.  Subsequently, he has noted some exertional chest pressure with activity.  Remotely he used to work out hard but admits that over the past 2 years he really has not exercised to any capacity.  He does admit to being under increased stress and that his wife died 45 years ago at age 60.  His youngest daughter also had brain cancer.  He was recently evaluated by Dr. Marijo File on December 25, 2019.  His ECG raise concern for prior heart attack.  There was no ECG to compare to laboratory showed a total cholesterol of 318, HDL 45, LDL 240, triglycerides 170.  Hemoglobin A1c was 6.1.  Creatinine 1.02.  LFTs were normal.  During his evaluation, his blood pressure was elevated at 172/102 and he was started on valsartan 160 mg he also was started on rosuvastatin 40 mg in light of his significant  hyperlipidemia.    When I saw him for initial evaluation on January 02, 2020, with his exertional change symptomatology I recommended initiation of metoprolol succinate 25 mg which would also assist in his blood pressure control.  I recommended an echo Doppler study as well as coronary CTA evaluation with possible FFR.  If LDL continues to be elevated on maximal therapy I discussed potential future PCSK9 inhibition.  His echo Doppler study on January 08, 2020 demonstrated normal systolic function with EF 60 to 65%.  There was mildly increased left ventricular posterior wall thickness without LVH.  Coronary CTA was significant abnormal with a coronary calcium score 2195, 98th percentile for age and sex matched control.  He was found to have severe calcified plaque in the proximal RCA and proximal to mid LAD.  Subsequent FFR analysis suggested hemodynamic significance with mid LAD FFR less than 0.5 and circumflex FFR 0.77.  The proximal RCA FFR was 0.74.  He was felt after occlusion of the mid RCA.  Mr. Reuter has felt improved with the addition of Toprol added to his valsartan.  I saw him as an add-on on February 04 2019 in follow-up of his severely abnormal coronary CTA.    He underwent cardiac catheterization on February 11 2020 which revealed severe coronary calcification with multivessel CAD.  There was 30% distal left main narrowing prior to its trifurcation into the LAD, ramus and circumflex vessel.  The LAD had 95%  near ostial stenosis followed by diffuse 60 to 70% proximal to mid calcified stenoses with diffuse 70% stenosis of the diagonal vessel.  Please intermediate had 50% proximal stenosis the circumflex 60% ostial OM1 stenosis in the large dominant RCA had severe 90 to 99% calcified proximal to mid stenoses.  He underwent successful CABG revascularization surgery the following day and had a LIMA placed to his LAD, SVG to PDA, SVG to OM1, and SVG to diagonal.  He developed postoperative atrial  fibrillation and was treated with IV amiodarone with conversion to sinus rhythm.    He subsequent was seen in our office by [name], PA-C on February 27 2020.  Presently he has been participating in cardiac rehab, phase 2.  He had not you have any recurrent atrial fibrillation and has continued to be on amiodarone 200 mg daily.  He states his blood pressure at cardiac rehab has been stable and today was 118/60.  He denies any recurrent chest pain.  He denies any exertional dyspnea.  He is unaware of palpitations.  He presents for evaluation.   Past medical history is notable for hypertension, hyperlipidemia, history of kidney stones, history of internal hemorrhoids, remote history of hematuria with negative work-up by urology, history of carpal tunnel syndrome status post bilateral release, previous documentation of aortoiliac atherosclerosis seen on CT imaging in 2019.  Past surgical history is notable for his carpal tunnel release, hemorrhoidal surgery, as well as vocal cord surgery.  Current Medications: Outpatient Medications Prior to Visit  Medication Sig Dispense Refill  . aspirin EC 81 MG tablet Take 81 mg by mouth at bedtime.     Marland Kitchen ezetimibe (ZETIA) 10 MG tablet Take 1 tablet (10 mg total) by mouth daily. (Patient taking differently: Take 10 mg by mouth every evening. ) 90 tablet 3  . nitroGLYCERIN (NITROSTAT) 0.4 MG SL tablet Place 1 tablet (0.4 mg total) under the tongue every 5 (five) minutes as needed for chest pain. 90 tablet 3  . rosuvastatin (CRESTOR) 40 MG tablet Take 40 mg by mouth every evening.     Marland Kitchen amiodarone (PACERONE) 200 MG tablet Take 1 tablet (200 mg total) by mouth daily. 90 tablet 3  . metoprolol succinate (TOPROL-XL) 50 MG 24 hr tablet Take 1 tablet (50 mg total) by mouth daily. 90 tablet 3  . acetaminophen (TYLENOL) 500 MG tablet Take 2 tablets (1,000 mg total) by mouth every 6 (six) hours as needed. (Patient not taking: Reported on 04/29/2020) 30 tablet 0   No  facility-administered medications prior to visit.     Allergies:   Patient has no known allergies.   Social History   Socioeconomic History  . Marital status: Widowed    Spouse name: Not on file  . Number of children: 2  . Years of education: 16  . Highest education level: Bachelor's degree (e.g., BA, AB, BS)  Occupational History  . Not on file  Tobacco Use  . Smoking status: Never Smoker  . Smokeless tobacco: Never Used  Substance and Sexual Activity  . Alcohol use: Yes    Alcohol/week: 3.0 standard drinks    Types: 1 Glasses of wine, 1 Cans of beer, 1 Shots of liquor per week  . Drug use: Never  . Sexual activity: Not on file  Other Topics Concern  . Not on file  Social History Narrative  . Not on file   Social Determinants of Health   Financial Resource Strain:   . Difficulty of Paying Living Expenses:  Food Insecurity:   . Worried About Charity fundraiser in the Last Year:   . Arboriculturist in the Last Year:   Transportation Needs:   . Film/video editor (Medical):   Marland Kitchen Lack of Transportation (Non-Medical):   Physical Activity:   . Days of Exercise per Week:   . Minutes of Exercise per Session:   Stress:   . Feeling of Stress :   Social Connections:   . Frequency of Communication with Friends and Family:   . Frequency of Social Gatherings with Friends and Family:   . Attends Religious Services:   . Active Member of Clubs or Organizations:   . Attends Archivist Meetings:   Marland Kitchen Marital Status:      Social history is notable in that he is widowed for 5 years when his wife died at age 61.  He has 2 children ages 15 and 43 and his youngest daughter had a previous brain tumor.  He graduated from [employer].  He works for [employer] in [employer].  He does not smoke cigarettes.  There is a rare cigar use.  He drinks an occasional beer wine scotch or bourbon.  Family History:  The patient's family history includes Heart attack in his brother,  brother, and father; Heart disease in his brother, sister, and sister.   His mother died at age 35.  His father died at age 63 but had a heart attack at age 48.  There are 6 siblings.  Brother age 41 who is alive and well, brother age 20 has a pacemaker and defibrillator, a brother died at age 106 with a heart attack and another brother died at age 32 with a heart attack.  One sister age 68 had a stent at age 68 and another sister at age 53 had bypass surgery at age 64.  ROS General: Negative; No fevers, chills, or night sweats;  HEENT: Negative; No changes in vision or hearing, sinus congestion, difficulty swallowing Pulmonary: Negative; No cough, wheezing, shortness of breath, hemoptysis Cardiovascular: See HPI GI: Negative; No nausea, vomiting, diarrhea, or abdominal pain GU: Negative; No dysuria, hematuria, or difficulty voiding Musculoskeletal: Positive for left radicular pain Hematologic/Oncology: Negative; no easy bruising, bleeding Endocrine: Negative; no heat/cold intolerance; no diabetes Neuro: Negative; no changes in balance, headaches Skin: Negative; No rashes or skin lesions Psychiatric: Negative; No behavioral problems, depression Sleep: Negative; No snoring, daytime sleepiness, hypersomnolence, bruxism, restless legs, hypnogognic hallucinations, no cataplexy Other comprehensive 14 point system review is negative.   PHYSICAL EXAM:   VS:  BP (!) 164/90   Pulse 68   Ht '5\' 9"'$  (1.753 m)   Wt 202 lb (91.6 kg)   SpO2 99%   BMI 29.83 kg/m     Repeat blood pressure by me was 170/86  Wt Readings from Last 3 Encounters:  04/29/20 202 lb (91.6 kg)  03/24/20 200 lb 2.8 oz (90.8 kg)  03/20/20 202 lb (91.6 kg)    General: Alert, oriented, no distress.  Skin: normal turgor, no rashes, warm and dry HEENT: Normocephalic, atraumatic. Pupils equal round and reactive to light; sclera anicteric; extraocular muscles intact; Nose without nasal septal hypertrophy Mouth/Parynx benign;  Mallinpatti scale 3 Neck: No JVD, no carotid bruits; normal carotid upstroke Lungs: clear to ausculatation and percussion; no wheezing or rales Chest wall: without tenderness to palpitation Heart: PMI not displaced, RRR, s1 s2 normal, 1/6 systolic murmur, no diastolic murmur, no rubs, gallops, thrills, or heaves Abdomen:  soft, nontender; no hepatosplenomehaly, BS+; abdominal aorta nontender and not dilated by palpation. Back: no CVA tenderness Pulses 2+ Musculoskeletal: full range of motion, normal strength, no joint deformities Extremities: no clubbing cyanosis or edema, Homan's sign negative  Neurologic: grossly nonfocal; Cranial nerves grossly wnl Psychologic: Normal mood and affect   Studies/Labs Reviewed:   EKG:  EKG is ordered today.  ECG (independently read by me): Normal sinus rhythm at 68 bpm.  Small inferior Q waves and early transition.  February 05, 2020 ECG (independently read by me): Normal sinus rhythm at 85 bpm with mild sinus arrhythmia, small inferior Q waves with possible inferior infarct.  QTc interval 428 ms..  Number 150 ms  January 02, 2020 ECG (independently read by me): Normal sinus rhythm at 81 bpm.  Small inferior Q waves in leads III and aVF suggestive of  age-indeterminate inferior infarct.  Early transition.  Normal intervals.  No ectopy  Recent Labs: BMP Latest Ref Rng & Units 02/16/2020 02/15/2020 02/14/2020  Glucose 70 - 99 mg/dL 112(H) 149(H) 193(H)  BUN 8 - 23 mg/dL '16 16 15  '$ Creatinine 0.61 - 1.24 mg/dL 1.07 0.96 0.91  BUN/Creat Ratio 10 - 24 - - -  Sodium 135 - 145 mmol/L 139 135 135  Potassium 3.5 - 5.1 mmol/L 3.9 4.0 4.3  Chloride 98 - 111 mmol/L 101 99 100  CO2 22 - 32 mmol/L '28 26 27  '$ Calcium 8.9 - 10.3 mg/dL 9.2 8.9 8.7(L)     Hepatic Function Latest Ref Rng & Units 02/11/2020 01/28/2020  Total Protein 6.5 - 8.1 g/dL 6.6 7.1  Albumin 3.5 - 5.0 g/dL 3.3(L) 4.3  AST 15 - 41 U/L 22 21  ALT 0 - 44 U/L 26 22  Alk Phosphatase 38 - 126 U/L 43 56    Total Bilirubin 0.3 - 1.2 mg/dL 0.8 0.3    CBC Latest Ref Rng & Units 02/16/2020 02/15/2020 02/14/2020  WBC 4.0 - 10.5 K/uL 14.7(H) 17.7(H) 21.3(H)  Hemoglobin 13.0 - 17.0 g/dL 10.3(L) 9.7(L) 9.1(L)  Hematocrit 39.0 - 52.0 % 31.0(L) 29.7(L) 27.6(L)  Platelets 150 - 400 K/uL 185 160 128(L)   Lab Results  Component Value Date   MCV 93.7 02/16/2020   MCV 93.7 02/15/2020   MCV 96.2 02/14/2020   No results found for: TSH Lab Results  Component Value Date   HGBA1C 6.4 (H) 02/11/2020     BNP No results found for: BNP  ProBNP No results found for: PROBNP   Lipid Panel     Component Value Date/Time   CHOL 128 03/30/2020 0950   TRIG 91 03/30/2020 0950   HDL 49 03/30/2020 0950   CHOLHDL 2.6 03/30/2020 0950   LDLCALC 62 03/30/2020 0950   LABVLDL 17 03/30/2020 0950     RADIOLOGY: No results found.   Additional studies/ records that were reviewed today include:  I reviewed the records from Dr. Marijo File  Laboratory from December 25, 2019: Total cholesterol 318, HDL 45, LDL 240, triglycerides 170 HDL 6.1, consistent with prediabetes Creatinine 1.02.  Echo Doppler study, coronary CTA and FFR analysis were reviewed and thoroughly discussed with the patient.   Ramus lesion is 50% stenosed.  Dist LM to Ost LAD lesion is 30% stenosed.  1st Mrg lesion is 60% stenosed.  Prox LAD lesion is 90% stenosed.  Prox LAD to Mid LAD lesion is 60% stenosed.  Mid LAD lesion is 70% stenosed.  Prox RCA lesion is 99% stenosed.  Ost RCA to Prox RCA lesion  is 90% stenosed.  Mid RCA lesion is 50% stenosed.  Dist RCA lesion is 20% stenosed.   Severe coronary calcification with multivessel CAD.  There is 30% distal narrowing of the left main coronary artery prior to its trifurcation into the LAD, ramus intermediate, and left circumflex vessel. The LAD has 90% very proximal (near ostial) stenosis followed by diffuse 60 and 70% proximal to mid calcified stenoses with diffuse 70%  stenosis in the diagonal vessel; 50% very proximal ramus intermediate stenosis; 60% ostial OM1 stenosis and large dominant RCA with 90% proximal followed by 99% calcified proximal to mid stenosis with 50% mid stenosis and mild 20% areas of luminal irregularity.  LVEDP 16 mmHg.  RECOMMENDATION: Surgical consultation for CABG surgery.  The patient will be admitted to the hospital.  He will be hydrated post procedure, and started on heparin therapy.  Aggressive lipid-lowering therapy will be necessary with potential need for PCSK9 inhibition.     ASSESSMENT:    1. Coronary artery disease involving native coronary artery of native heart with angina pectoris ([place])   2. Hx of CABG   3. PAF (paroxysmal atrial fibrillation) ([place])   4. Essential hypertension   5. Hyperlipidemia with target LDL less than 70     PLAN:  Mr. Darian Cansler is a 64 year old gentleman who has a history of hypertension, significant hyperlipidemia as well as family history for heart disease.  The patient has had markedly elevated LDL levels in the past suggesting a high likelihood for familial hyperlipidemia.  His  lipid panel in December 2020 has shown a total cholesterol of 318 with an LDL cholesterol of 240.  The patient  had experienced an episode where he passed out being and subsequently has noticed some exertional chest pressure.  When I saw him for initial evaluation his ECG showed evidence for inferior Q waves with early transition suggesting the possibility that he may have had an inferior posterior MI in the past.  He had recently been found to have stage II hypertension and was started on valsartan 160 mg daily.  At his initial evaluation with me I reviewed new hypertensive guidelines and with his chest discomfort and initially low-dose metoprolol succinate at 25 mg daily.  He was found to have a markedly abnormal coronary CTA and subsequent cardiac catheterization demonstrated severe multivessel CAD.  He  underwent successful CABG surgery x4 the following day by Dr. Kipp Brood.  His postoperative course was complicated by atrial fibrillation for which she was started on amiodarone therapy.  Most recently he continues to be on amiodarone 20 mg daily, Toprol-XL 50 mg and is now on rosuvastatin 40 mg in addition to Zetia 10 mg and baby aspirin.  He is not aware of any recurrent atrial fibrillation and I have recommended he reduce his amiodarone down to 100 mg for the next 2 weeks and then discontinue amiodarone at which time he will also increase Toprol-XL to 75 mg daily.  Increase Toprol dose will also be effective for his blood pressure.  His blood pressure was elevated in the office today but he stayed at cardiac rehab earlier today his blood pressure was 118/60 and there have been some recordings at cardiac rehab where his blood pressure had been as low as 104/62.  His lipid studies have dramatically improved on rosuvastatin 40 mg in addition to Zetia 10 mg and studies from March 30 2020 showed a total cholesterol 128 with LDL cholesterol 62 and HDL cholesterol 49.  He has not had  any anginal symptoms.  He is unaware of any recurrent palpitations.  He feels well doing cardiac rehab and is benefiting from this.  I will see him in 3 months for reevaluation or sooner as necessary  Medication Adjustments/Labs and Tests Ordered: Current medicines are reviewed at length with the patient today.  Concerns regarding medicines are outlined above.  Medication changes, Labs and Tests ordered today are listed in the Patient Instructions below. Patient Instructions  Medication Instructions:  DECREASE YOUR AMIODARONE TO '100MG'$  FOR 2 WEEKS (1/2 TAB) THEN STOP THE AMIODARONE ALTOGETHER.  IN 2 WEEKS, INCREASE YOUR TOPROL TO '75MG'$  DAILY (1.5 TABS)  *If you need a refill on your cardiac medications before your next appointment, please call your pharmacy*   Testing/Procedures: Your physician has requested that you have an  echocardiogram. Echocardiography is a painless test that uses sound waves to create images of your heart. It provides your doctor with information about the size and shape of your heart and how well your heart's chambers and valves are working. This procedure takes approximately one hour. There are no restrictions for this procedure.  [place]   Follow-Up: At [facility], you and your health needs are our priority.  As part of our continuing mission to provide you with exceptional heart care, we have created designated Provider Care Teams.  These Care Teams include your primary Cardiologist (physician) and Advanced Practice Providers (APPs -  Physician Assistants and Nurse Practitioners) who all work together to provide you with the care you need, when you need it.  We recommend signing up for the patient portal called "MyChart".  Sign up information is provided on this After Visit Summary.  MyChart is used to connect with patients for Virtual Visits (Telemedicine).  Patients are able to view lab/test results, encounter notes, upcoming appointments, etc.  Non-urgent messages can be sent to your provider as well.   To learn more about what you can do with MyChart, go to [email or web address].    Your next appointment:   3 month(s)  The format for your next appointment:   In Person  Provider:   Shelva Majestic, MD       Signed, Shelva Majestic, MD  05/06/2020 [place] [address] [place], [place], [place]  [address] Phone: [phone]  He had discontinued this nature recommended with the spironolactone and that I would get that maybe about a week or 2 his digestive follow-up make sure the potassium does not allow

## 2020-05-01 ENCOUNTER — Encounter (HOSPITAL_COMMUNITY)
Admission: RE | Admit: 2020-05-01 | Discharge: 2020-05-01 | Disposition: A | Discharge status: Home / Self Care | Payer: 59 | Source: Ambulatory Visit | Attending: Cardiovascular Disease | Admitting: Cardiovascular Disease

## 2020-05-01 ENCOUNTER — Other Ambulatory Visit: Payer: Self-pay

## 2020-05-01 DIAGNOSIS — Z951 Presence of aortocoronary bypass graft: Secondary | ICD-10-CM

## 2020-05-04 ENCOUNTER — Other Ambulatory Visit: Payer: Self-pay

## 2020-05-04 ENCOUNTER — Encounter (HOSPITAL_COMMUNITY)
Admission: RE | Admit: 2020-05-04 | Discharge: 2020-05-04 | Disposition: A | Discharge status: Home / Self Care | Payer: 59 | Source: Ambulatory Visit | Attending: Cardiovascular Disease | Admitting: Cardiovascular Disease

## 2020-05-04 DIAGNOSIS — Z951 Presence of aortocoronary bypass graft: Secondary | ICD-10-CM

## 2020-05-06 ENCOUNTER — Other Ambulatory Visit: Payer: Self-pay

## 2020-05-06 ENCOUNTER — Encounter: Payer: Self-pay | Admitting: Cardiovascular Disease

## 2020-05-06 ENCOUNTER — Encounter (HOSPITAL_COMMUNITY)
Admission: RE | Admit: 2020-05-06 | Discharge: 2020-05-06 | Disposition: A | Discharge status: Home / Self Care | Payer: 59 | Source: Ambulatory Visit | Attending: Cardiovascular Disease | Admitting: Cardiovascular Disease

## 2020-05-06 DIAGNOSIS — Z951 Presence of aortocoronary bypass graft: Secondary | ICD-10-CM

## 2020-05-08 ENCOUNTER — Encounter (HOSPITAL_COMMUNITY)
Admission: RE | Admit: 2020-05-08 | Discharge: 2020-05-08 | Disposition: A | Discharge status: Home / Self Care | Payer: 59 | Source: Ambulatory Visit | Attending: Cardiovascular Disease | Admitting: Cardiovascular Disease

## 2020-05-08 ENCOUNTER — Other Ambulatory Visit: Payer: Self-pay

## 2020-05-08 DIAGNOSIS — Z951 Presence of aortocoronary bypass graft: Secondary | ICD-10-CM | POA: Diagnosis not present

## 2020-05-11 ENCOUNTER — Other Ambulatory Visit: Payer: Self-pay

## 2020-05-11 ENCOUNTER — Encounter (HOSPITAL_COMMUNITY)
Admission: RE | Admit: 2020-05-11 | Discharge: 2020-05-11 | Disposition: A | Discharge status: Home / Self Care | Payer: 59 | Source: Ambulatory Visit | Attending: Cardiovascular Disease | Admitting: Cardiovascular Disease

## 2020-05-11 DIAGNOSIS — Z951 Presence of aortocoronary bypass graft: Secondary | ICD-10-CM | POA: Diagnosis not present

## 2020-05-13 ENCOUNTER — Other Ambulatory Visit: Payer: Self-pay

## 2020-05-13 ENCOUNTER — Encounter (HOSPITAL_COMMUNITY)
Admission: RE | Admit: 2020-05-13 | Discharge: 2020-05-13 | Disposition: A | Discharge status: Home / Self Care | Payer: 59 | Source: Ambulatory Visit | Attending: Cardiovascular Disease | Admitting: Cardiovascular Disease

## 2020-05-13 DIAGNOSIS — Z951 Presence of aortocoronary bypass graft: Secondary | ICD-10-CM | POA: Diagnosis not present

## 2020-05-15 ENCOUNTER — Other Ambulatory Visit: Payer: Self-pay

## 2020-05-15 ENCOUNTER — Encounter (HOSPITAL_COMMUNITY)
Admission: RE | Admit: 2020-05-15 | Discharge: 2020-05-15 | Disposition: A | Discharge status: Home / Self Care | Payer: 59 | Source: Ambulatory Visit | Attending: Cardiovascular Disease | Admitting: Cardiovascular Disease

## 2020-05-15 DIAGNOSIS — Z951 Presence of aortocoronary bypass graft: Secondary | ICD-10-CM | POA: Diagnosis not present

## 2020-05-18 ENCOUNTER — Encounter (HOSPITAL_COMMUNITY)
Admission: RE | Admit: 2020-05-18 | Discharge: 2020-05-18 | Disposition: A | Discharge status: Home / Self Care | Payer: 59 | Source: Ambulatory Visit | Attending: Cardiovascular Disease | Admitting: Cardiovascular Disease

## 2020-05-18 ENCOUNTER — Other Ambulatory Visit: Payer: Self-pay

## 2020-05-18 DIAGNOSIS — Z951 Presence of aortocoronary bypass graft: Secondary | ICD-10-CM

## 2020-05-20 ENCOUNTER — Encounter (HOSPITAL_COMMUNITY)
Admission: RE | Admit: 2020-05-20 | Discharge: 2020-05-20 | Disposition: A | Discharge status: Home / Self Care | Payer: 59 | Source: Ambulatory Visit | Attending: Cardiovascular Disease | Admitting: Cardiovascular Disease

## 2020-05-20 ENCOUNTER — Other Ambulatory Visit: Payer: Self-pay

## 2020-05-20 DIAGNOSIS — Z951 Presence of aortocoronary bypass graft: Secondary | ICD-10-CM | POA: Diagnosis not present

## 2020-05-22 ENCOUNTER — Other Ambulatory Visit: Payer: Self-pay

## 2020-05-22 ENCOUNTER — Encounter (HOSPITAL_COMMUNITY)
Admission: RE | Admit: 2020-05-22 | Discharge: 2020-05-22 | Disposition: A | Discharge status: Home / Self Care | Payer: 59 | Source: Ambulatory Visit | Attending: Cardiovascular Disease | Admitting: Cardiovascular Disease

## 2020-05-22 VITALS — Ht 69.75 in | Wt 198.9 lb

## 2020-05-22 DIAGNOSIS — Z951 Presence of aortocoronary bypass graft: Secondary | ICD-10-CM | POA: Diagnosis not present

## 2020-05-22 NOTE — Progress Notes (Signed)
Discharge Progress Report  Patient Details  Name: Jesus Francis MRN: [id number] Date of Birth: [birth year] Referring Provider:     CARDIAC REHAB PHASE II ORIENTATION from 03/24/2020 in [facility]  Referring Provider  Troy Sine, MD       Number of Visits: 23  Reason for Discharge:  Patient reached a stable level of exercise. Patient independent in their exercise. Patient has met program and personal goals.  Smoking History:  Social History   Tobacco Use  Smoking Status Never Smoker  Smokeless Tobacco Never Used    Diagnosis:  S/P CABG x 4 02/12/20  S/P L Carotid Endarterctomy  ADL UCSD:   Initial Exercise Prescription: Initial Exercise Prescription - 03/24/20 1000      Date of Initial Exercise RX and Referring Provider   Date  03/24/20    Referring Provider  Troy Sine, MD    Expected Discharge Date  05/22/20      Treadmill   MPH  3.2    Grade  1    Minutes  15    METs  3.89      NuStep   Level  4    SPM  85    Minutes  15    METs  3      Prescription Details   Frequency (times per week)  3    Duration  Progress to 30 minutes of continuous aerobic without signs/symptoms of physical distress      Intensity   THRR 40-80% of Max Heartrate  62-125    Ratings of Perceived Exertion  11-13    Perceived Dyspnea  0-4      Progression   Progression  Continue to progress workloads to maintain intensity without signs/symptoms of physical distress.      Resistance Training   Training Prescription  Yes    Weight  4lbs    Reps  10-15       Discharge Exercise Prescription (Final Exercise Prescription Changes): Exercise Prescription Changes - 05/22/20 0700      Response to Exercise   Blood Pressure (Admit)  122/58    Blood Pressure (Exercise)  150/66    Blood Pressure (Exit)  112/66    Heart Rate (Admit)  86 bpm    Heart Rate (Exercise)  103 bpm    Heart Rate (Exit)  84 bpm    Rating of Perceived Exertion  (Exercise)  12    Symptoms  none    Duration  Continue with 30 min of aerobic exercise without signs/symptoms of physical distress.    Intensity  THRR unchanged      Progression   Progression  Continue to progress workloads to maintain intensity without signs/symptoms of physical distress.    Average METs  4.8      Resistance Training   Training Prescription  Yes    Weight  6lbs    Reps  10-15    Time  10 Minutes      Interval Training   Interval Training  No      Treadmill   MPH  3.2    Grade  3    Minutes  15    METs  4.77      NuStep   Level  5    SPM  85    Minutes  15    METs  4.9      Home Exercise Plan   Plans to continue exercise at  Home (comment)  Walking   Frequency  Add 4 additional days to program exercise sessions.    Initial Home Exercises Provided  04/08/20       Functional Capacity: 6 Minute Walk    Row Name 03/24/20 0902 05/15/20 0652       6 Minute Walk   Phase  Initial  Discharge    Distance  1868 feet  1868 feet    Distance % Change  -  0.05 %    Distance Feet Change  -  1 ft    Walk Time  6 minutes  6 minutes    # of Rest Breaks  0  0    MPH  3.54  3.54    METS  4.53  4.21    RPE  11  11    Perceived Dyspnea   0  0    VO2 Peak  15.86  14.74    Symptoms  No  No    Resting HR  74 bpm  68 bpm    Resting BP  140/80  114/64    Resting Oxygen Saturation   99 %  -    Exercise Oxygen Saturation  during 6 min walk  97 %  -    Max Ex. HR  103 bpm  98 bpm    Max Ex. BP  174/62  144/62    2 Minute Post BP  138/80  124/60       Psychological, QOL, Others - Outcomes: PHQ 2/9: Depression screen PHQ 2/9 03/24/2020  Decreased Interest 0  Down, Depressed, Hopeless 0  PHQ - 2 Score 0    Quality of Life: Quality of Life - 05/20/20 1441      Quality of Life   Select  Quality of Life      Quality of Life Scores   Health/Function Pre  29.2 %    Health/Function Post  30 %    Health/Function % Change  2.74 %    Socioeconomic Pre  30 %     Socioeconomic Post  30 %    Socioeconomic % Change   0 %    Psych/Spiritual Pre  30 %    Psych/Spiritual Post  30 %    Psych/Spiritual % Change  0 %    Family Pre  30 %    Family Post  30 %    Family % Change  0 %    GLOBAL Pre  29.65 %    GLOBAL Post  30 %    GLOBAL % Change  1.18 %       Personal Goals: Goals established at orientation with interventions provided to work toward goal. Personal Goals and Risk Factors at Admission - 03/24/20 1140      Core Components/Risk Factors/Patient Goals on Admission    Weight Management  Yes;Weight Maintenance    Intervention  Weight Management: Develop a combined nutrition and exercise program designed to reach desired caloric intake, while maintaining appropriate intake of nutrient and fiber, sodium and fats, and appropriate energy expenditure required for the weight goal.;Weight Management: Provide education and appropriate resources to help participant work on and attain dietary goals.    Expected Outcomes  Weight Maintenance: Understanding of the daily nutrition guidelines, which includes 25-35% calories from fat, 7% or less cal from saturated fats, less than 24m cholesterol, less than 1.5gm of sodium, & 5 or more servings of fruits and vegetables daily;Weight Loss: Understanding of general recommendations for a balanced deficit meal plan, which  promotes 1-2 lb weight loss per week and includes a negative energy balance of [phone] kcal/d    Hypertension  Yes    Intervention  Provide education on lifestyle modifcations including regular physical activity/exercise, weight management, moderate sodium restriction and increased consumption of fresh fruit, vegetables, and low fat dairy, alcohol moderation, and smoking cessation.;Monitor prescription use compliance.    Expected Outcomes  Short Term: Continued assessment and intervention until BP is < 140/85m HG in hypertensive participants. < 130/834mHG in hypertensive participants with diabetes,  heart failure or chronic kidney disease.;Long Term: Maintenance of blood pressure at goal levels.    Lipids  Yes    Intervention  Provide education and support for participant on nutrition & aerobic/resistive exercise along with prescribed medications to achieve LDL <7029mHDL >34m14m  Expected Outcomes  Short Term: Participant states understanding of desired cholesterol values and is compliant with medications prescribed. Participant is following exercise prescription and nutrition guidelines.;Long Term: Cholesterol controlled with medications as prescribed, with individualized exercise RX and with personalized nutrition plan. Value goals: LDL < 70mg70mL > 40 mg.        Personal Goals Discharge: Goals and Risk Factor Review    Row Name 04/03/20 1038 04/27/20 1142 05/29/20 1054         Core Components/Risk Factors/Patient Goals Review   Personal Goals Review  Weight Management/Obesity;Hypertension;Lipids  Weight Management/Obesity;Hypertension;Lipids  Weight Management/Obesity;Hypertension;Lipids     Review  Mr. MiddlShumanmultiple CAD risk factors. He is eager to participate in cardiac rehab for risk factor modification. His goals are to regain his stamina and strength, have less sternal soreness and to maintain/improve his engergy to do ADLS  Mr. MiddlSkelleymultiple CAD risk factors. He continues to be eager to participate in cardiac rehab for risk factor modification. He is tolerating workload increases and always workes to an RPE of 11-13. He feels he is on track to meet his goals which are to increase his stamina and strength and maintain the energy needed to do ADLs  Mr. MiddlGallimultiple CAD risk factors. He continues to exercise at home, take his medications as prescribed, and follow his CAD plan of care for risk factor reduction. His BP remains at goal, weight is stable.     Expected Outcomes  Continue to participate in CR for risk factor reduction  Continue to participate in CR  for risk factor reduction and maintain his home exercise plan  Continue to follow his CAD risk factor reduction plan of care post graduation.        Exercise Goals and Review: Exercise Goals    Row Name 03/24/20 0854             Exercise Goals   Increase Physical Activity  Yes       Intervention  Provide advice, education, support and counseling about physical activity/exercise needs.;Develop an individualized exercise prescription for aerobic and resistive training based on initial evaluation findings, risk stratification, comorbidities and participant's personal goals.       Expected Outcomes  Short Term: Attend rehab on a regular basis to increase amount of physical activity.;Long Term: Exercising regularly at least 3-5 days a week.;Long Term: Add in home exercise to make exercise part of routine and to increase amount of physical activity.       Increase Strength and Stamina  Yes       Intervention  Provide advice, education, support and counseling about physical activity/exercise needs.;Develop an individualized exercise prescription for  aerobic and resistive training based on initial evaluation findings, risk stratification, comorbidities and participant's personal goals.       Expected Outcomes  Short Term: Increase workloads from initial exercise prescription for resistance, speed, and METs.;Short Term: Perform resistance training exercises routinely during rehab and add in resistance training at home;Long Term: Improve cardiorespiratory fitness, muscular endurance and strength as measured by increased METs and functional capacity (6MWT)       Able to understand and use rate of perceived exertion (RPE) scale  Yes       Intervention  Provide education and explanation on how to use RPE scale       Expected Outcomes  Short Term: Able to use RPE daily in rehab to express subjective intensity level;Long Term:  Able to use RPE to guide intensity level when exercising independently        Knowledge and understanding of Target Heart Rate Range (THRR)  Yes       Intervention  Provide education and explanation of THRR including how the numbers were predicted and where they are located for reference       Expected Outcomes  Short Term: Able to state/look up THRR;Long Term: Able to use THRR to govern intensity when exercising independently;Short Term: Able to use daily as guideline for intensity in rehab       Able to check pulse independently  Yes       Intervention  Provide education and demonstration on how to check pulse in carotid and radial arteries.;Review the importance of being able to check your own pulse for safety during independent exercise       Expected Outcomes  Short Term: Able to explain why pulse checking is important during independent exercise;Long Term: Able to check pulse independently and accurately       Understanding of Exercise Prescription  Yes       Intervention  Provide education, explanation, and written materials on patient's individual exercise prescription       Expected Outcomes  Short Term: Able to explain program exercise prescription;Long Term: Able to explain home exercise prescription to exercise independently          Exercise Goals Re-Evaluation: Exercise Goals Re-Evaluation    Row Name 04/01/20 0757 04/08/20 0702 04/27/20 0710 05/13/20 0700 05/26/20 1434     Exercise Goal Re-Evaluation   Exercise Goals Review  Increase Physical Activity;Able to understand and use rate of perceived exertion (RPE) scale  Increase Physical Activity;Able to understand and use rate of perceived exertion (RPE) scale;Knowledge and understanding of Target Heart Rate Range (THRR);Able to check pulse independently;Understanding of Exercise Prescription;Increase Strength and Stamina  Increase Physical Activity;Able to understand and use rate of perceived exertion (RPE) scale;Knowledge and understanding of Target Heart Rate Range (THRR);Able to check pulse  independently;Understanding of Exercise Prescription;Increase Strength and Stamina  Increase Physical Activity;Able to understand and use rate of perceived exertion (RPE) scale;Knowledge and understanding of Target Heart Rate Range (THRR);Able to check pulse independently;Understanding of Exercise Prescription;Increase Strength and Stamina  Increase Physical Activity;Able to understand and use rate of perceived exertion (RPE) scale;Knowledge and understanding of Target Heart Rate Range (THRR);Able to check pulse independently;Understanding of Exercise Prescription;Increase Strength and Stamina   Comments  Patient able to understand and use RPE scale appropriately.  Reviewed home exercise guidelines with patient including endpoints, temperature precautions, target heart rate and rate of perceived exertion. Pt is walking 1.5-1.6 miles in about 30 minutes 3-4 days/week as his mode of home exercise. Pt knows  how to manually count his pulse. Pt voices understanding of instructions given.  Patient states he is continuing his same exercise routine 30 minutes walking. Patient is progressing well with exercise.  Patient will complete the cardiac rehab program on Friday 05/22/20. Patient will continue walking at least 30 minutes, 5-6 days/week. Patient also has free weights at home to use for his resistance training.  Patient completed the phase 2 cardiac rehab program and will continue exericse independently at this time.   Expected Outcomes  Increase workloads as tolerated to help achieve personal health and  fitness goals.  Patient will continue daily exercise routine to help achieve personal health and fitness goals.  Continue daily exercise routine.  Patient will continue walking routine to help maintain cardiorespiratory fitness.  Patient will continue walking at least 30 minutes, 5-6 days/week and resistance training at least 2 days/week to maintain health and fitness gains.      Nutrition & Weight -  Outcomes: Pre Biometrics - 03/24/20 0853      Pre Biometrics   Height  5' 9.75" (1.772 m)    Weight  90.8 kg    Waist Circumference  41.5 inches    Hip Circumference  41.5 inches    Waist to Hip Ratio  1 %    BMI (Calculated)  28.92    Triceps Skinfold  10 mm    % Body Fat  26.8 %    Grip Strength  44 kg    Flexibility  14 in    Single Leg Stand  22.56 seconds      Post Biometrics - 05/22/20 0700       Post  Biometrics   Height  5' 9.75" (1.772 m)    Weight  90.2 kg    Waist Circumference  40 inches    Hip Circumference  40.75 inches    Waist to Hip Ratio  0.98 %    BMI (Calculated)  28.73    Triceps Skinfold  17 mm    % Body Fat  28.2 %    Grip Strength  42.5 kg    Flexibility  14.5 in    Single Leg Stand  30 seconds       Nutrition: Nutrition Therapy & Goals - 04/06/20 0756      Nutrition Therapy   Diet  Heart Healthy      Personal Nutrition Goals   Nutrition Goal  Pt to build a healthy plate including vegetables, fruits, whole grains, and low-fat dairy products in a heart healthy meal plan.      Intervention Plan   Intervention  Prescribe, educate and counsel regarding individualized specific dietary modifications aiming towards targeted core components such as weight, hypertension, lipid management, diabetes, heart failure and other comorbidities.    Expected Outcomes  Short Term Goal: Understand basic principles of dietary content, such as calories, fat, sodium, cholesterol and nutrients.       Nutrition Discharge: Nutrition Assessments - 05/15/20 1007      MEDFICTS Scores   Post Score  45       Education Questionnaire Score: Knowledge Questionnaire Score - 05/20/20 1442      Knowledge Questionnaire Score   Pre Score  21/24    Post Score  24/24       Goals reviewed with patient; copy given to patient.

## 2020-05-26 ENCOUNTER — Telehealth: Payer: Self-pay | Admitting: Cardiovascular Disease

## 2020-05-26 NOTE — Telephone Encounter (Signed)
[id number]

## 2020-05-26 NOTE — Telephone Encounter (Signed)
Patient wanted to know if there were any special instructions that he would need to follow prior to having a regular cleaning at his Dentist. He is scheduled to have a cleaning 06/24 but wanted to check sooner just to be safe

## 2020-05-27 NOTE — Telephone Encounter (Signed)
No indication for prophylactic antibiotics prior to dental cleaning

## 2020-05-28 NOTE — Telephone Encounter (Signed)
Noted. Message sent through [facility]

## 2020-05-30 IMAGING — CR DG CHEST 2V
2 series · 2 of 2 positions shown · non-contrast
Comparison: 02/15/2020

CLINICAL DATA: Status post CABG.

EXAM:
CHEST - 2 VIEW

[w chest pa]
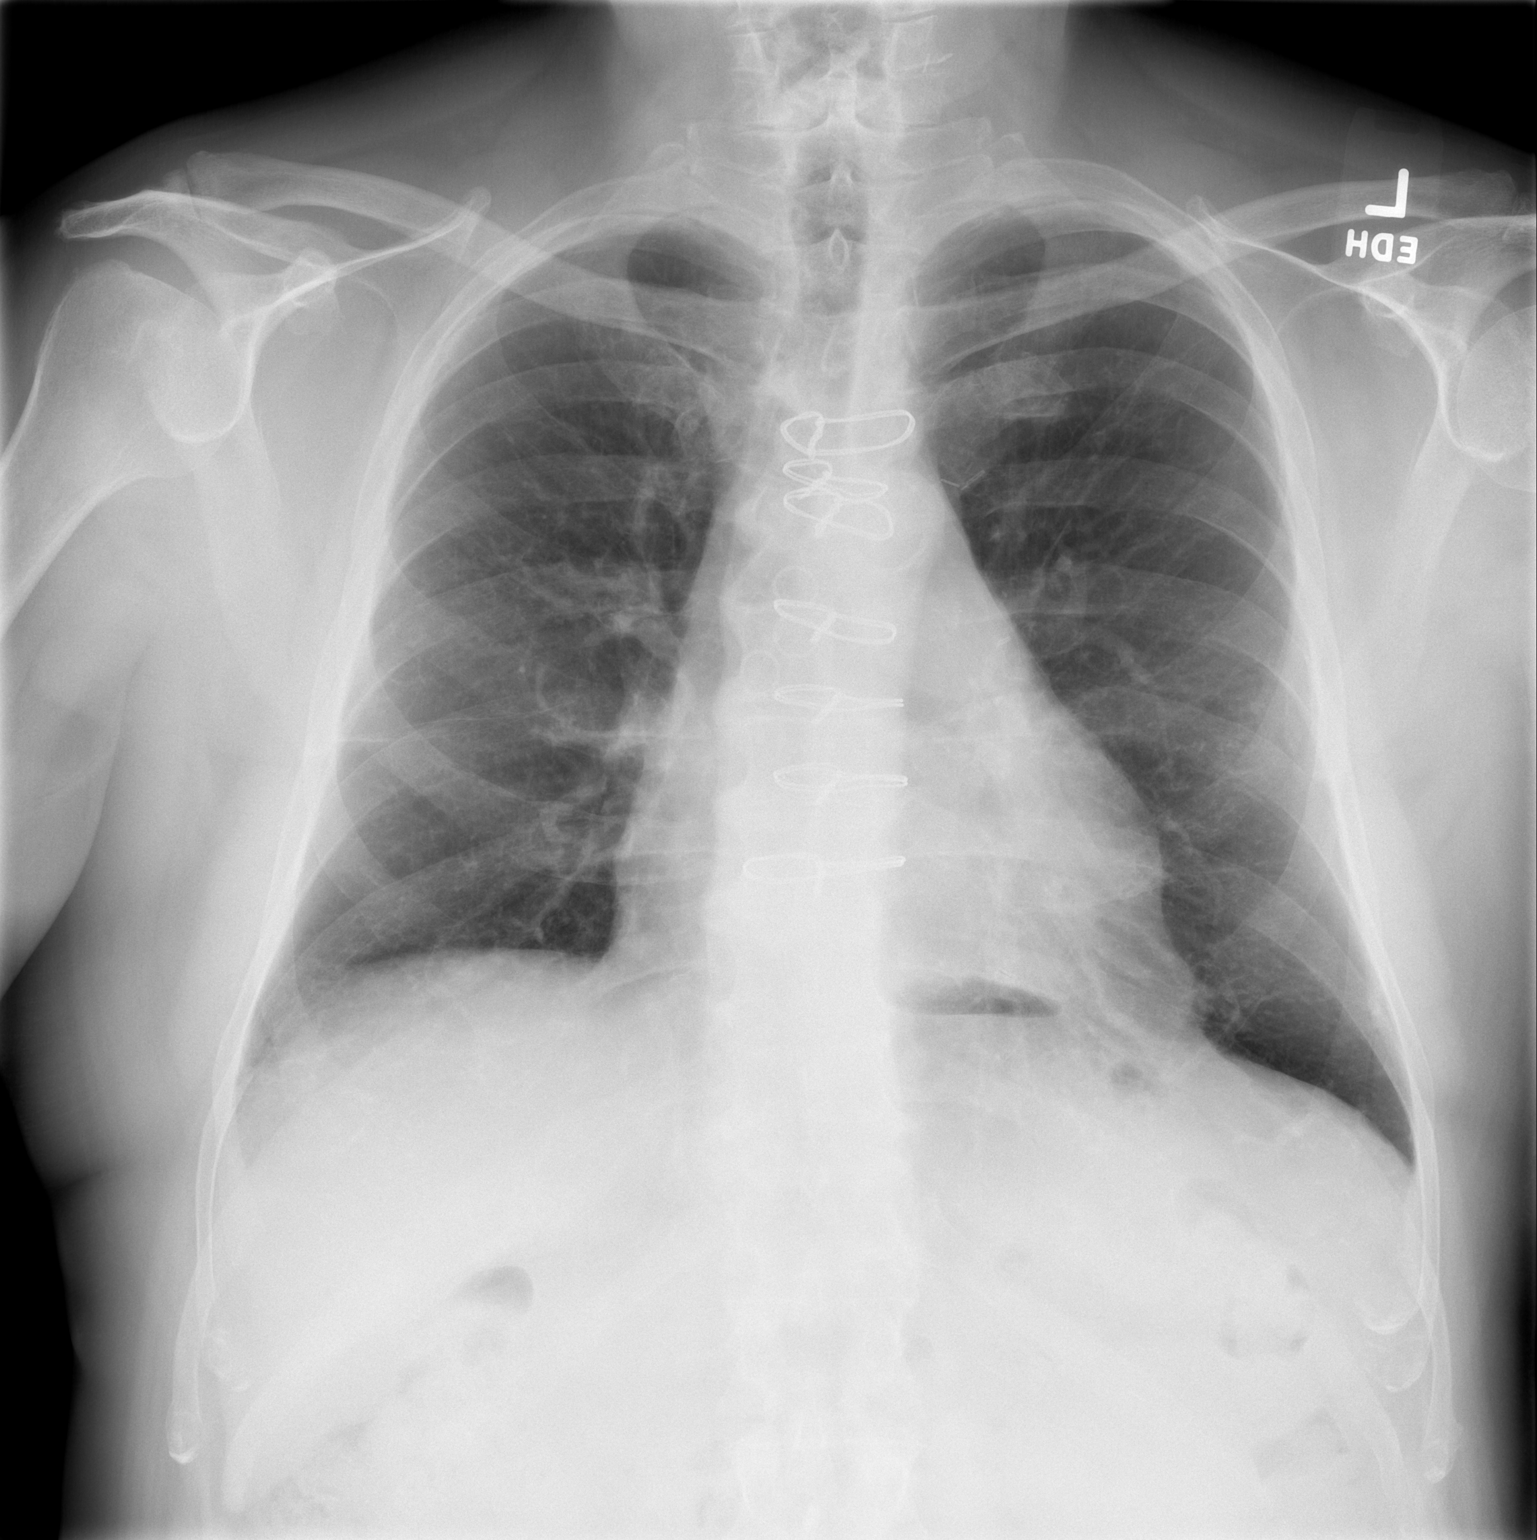

[w chest lat]
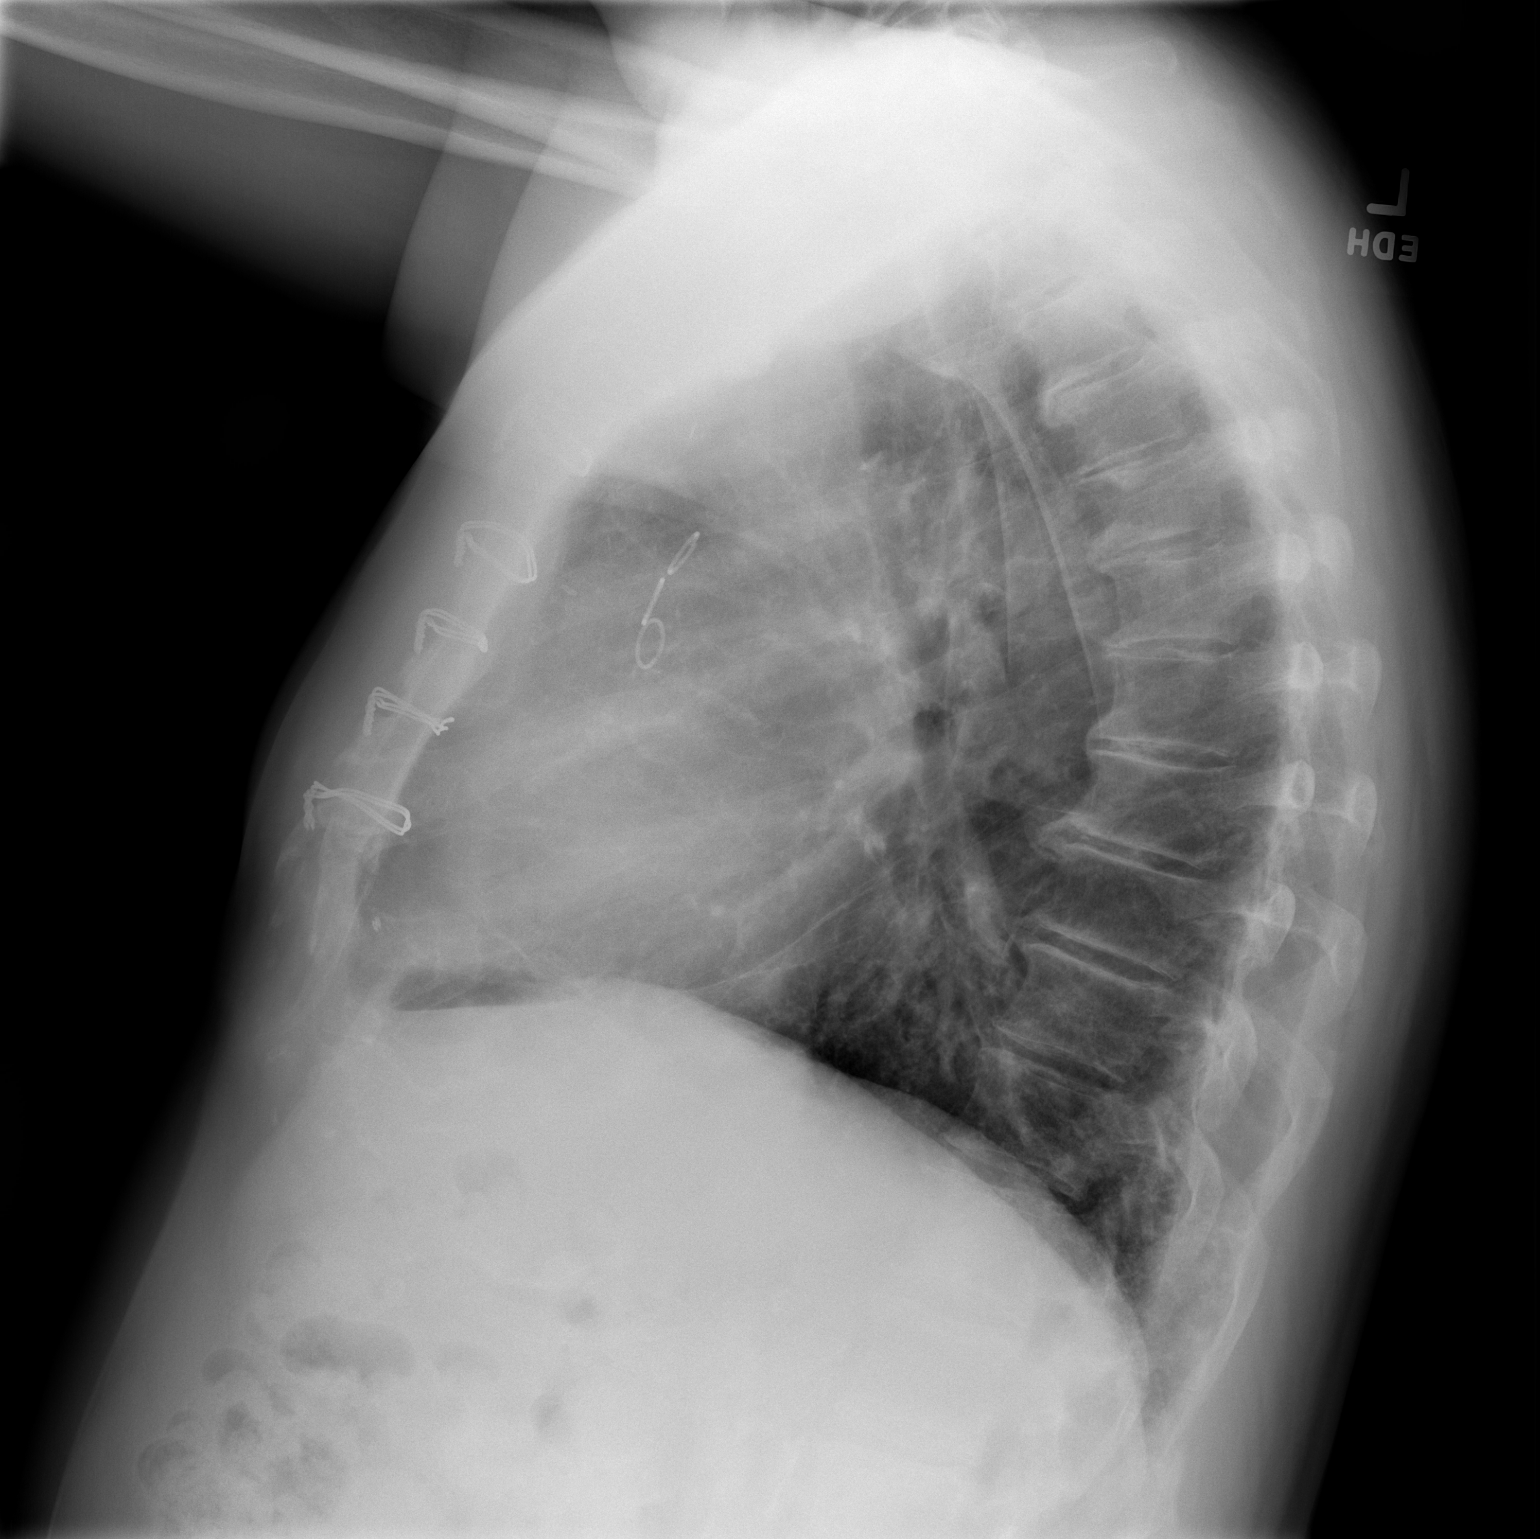

[2 of 2 positions shown; findings below may reference images not displayed]

FINDINGS: Median sternotomy and changes of CABG with stable mediastinal
contours.

Lungs are clear.

No acute bone finding. Spinal degenerative changes.
IMPRESSION: No active cardiopulmonary disease.

Status post CABG

## 2020-06-09 ENCOUNTER — Ambulatory Visit (HOSPITAL_COMMUNITY): Payer: 59 | Attending: Cardiovascular Disease

## 2020-06-09 ENCOUNTER — Other Ambulatory Visit: Payer: Self-pay

## 2020-06-09 DIAGNOSIS — I10 Essential (primary) hypertension: Secondary | ICD-10-CM | POA: Diagnosis present

## 2020-08-04 ENCOUNTER — Encounter: Payer: Self-pay | Admitting: Cardiovascular Disease

## 2020-08-04 ENCOUNTER — Ambulatory Visit (INDEPENDENT_AMBULATORY_CARE_PROVIDER_SITE_OTHER): Payer: No Typology Code available for payment source | Admitting: Cardiovascular Disease

## 2020-08-04 ENCOUNTER — Other Ambulatory Visit: Payer: Self-pay

## 2020-08-04 VITALS — BP 162/94 | HR 68 | Ht 69.0 in | Wt 204.2 lb

## 2020-08-04 DIAGNOSIS — I25119 Atherosclerotic heart disease of native coronary artery with unspecified angina pectoris: Secondary | ICD-10-CM | POA: Diagnosis not present

## 2020-08-04 DIAGNOSIS — Z951 Presence of aortocoronary bypass graft: Secondary | ICD-10-CM | POA: Diagnosis not present

## 2020-08-04 DIAGNOSIS — I10 Essential (primary) hypertension: Secondary | ICD-10-CM | POA: Diagnosis not present

## 2020-08-04 DIAGNOSIS — E785 Hyperlipidemia, unspecified: Secondary | ICD-10-CM

## 2020-08-04 DIAGNOSIS — I480 Paroxysmal atrial fibrillation: Secondary | ICD-10-CM | POA: Diagnosis not present

## 2020-08-04 NOTE — Patient Instructions (Signed)

## 2020-08-04 NOTE — Progress Notes (Signed)
Cardiology Office Note    Date:  08/04/2020   ID:  Jesus Francis, Jesus Francis 1956/10/10, MRN [id number]  PCP: Dr. Marijo Francis Cardiologist:  Jesus Majestic, MD   F/U cardiology evaluation initially sent through the courtesy of Dr. Marijo Francis for evaluation of exertional chest discomfort.  History of Present Illness:  Jesus Francis is a 64 y.o. male who was initially referred for through the courtesy of Dr. Marijo Francis for exertional chest pressure.  He presents for 70-monthfollow-up evaluation.    [name] a history of hypertension as well as hyperlipidemia.  In November 2018, LDL cholesterol was 264 and apparently was against statin therapy.  He states that over 10 years ago he did experience some chest discomfort and apparently had a stress test.  He was told that this was normal but never saw a cardiologist.  The patient has issues with bone spurs involving his cervical and thoracic spine.  He states that around Thanksgiving he became upset and apparently passed out for short duration.  He did note chest pressure.  Subsequently, he has noted some exertional chest pressure with activity.  Remotely he used to work out hard but admits that over the past 2 years he really has not exercised to any capacity.  He does admit to being under increased stress and that his wife died 581years ago at age 65  His youngest daughter also had brain cancer.  He was recently evaluated by Dr. DMarijo Fileon December 25, 2019.  His ECG raise concern for prior heart attack.  There was no ECG to compare to laboratory showed a total cholesterol of 318, HDL 45, LDL 240, triglycerides 170.  Hemoglobin A1c was 6.1.  Creatinine 1.02.  LFTs were normal.  During his evaluation, his blood pressure was elevated at 172/102 and he was started on valsartan 160 mg he also was started on rosuvastatin 40 mg in light of his significant hyperlipidemia.    When I saw him for initial evaluation on January 02, 2020, with his  exertional change symptomatology I recommended initiation of metoprolol succinate 25 mg which would also assist in his blood pressure control.  I recommended an echo Doppler study as well as coronary CTA evaluation with possible FFR.  If LDL continues to be elevated on maximal therapy I discussed potential future PCSK9 inhibition.  His echo Doppler study on January 08, 2020 demonstrated normal systolic function with EF 60 to 65%.  There was mildly increased left ventricular posterior wall thickness without LVH.  Coronary CTA was significant abnormal with a coronary calcium score 2195, 98th percentile for age and sex matched control.  He was found to have severe calcified plaque in the proximal RCA and proximal to mid LAD.  Subsequent FFR analysis suggested hemodynamic significance with mid LAD FFR less than 0.5 and circumflex FFR 0.77.  The proximal RCA FFR was 0.74.  He was felt after occlusion of the mid RCA.  Jesus Francis felt improved with the addition of Toprol added to his valsartan.  I saw him as an add-on on February 04 2019 in follow-up of his severely abnormal coronary CTA.    He underwent cardiac catheterization on February 11 2020 which revealed severe coronary calcification with multivessel CAD.  There was 30% distal left main narrowing prior to its trifurcation into the LAD, ramus and circumflex vessel.  The LAD had 95% near ostial stenosis followed by diffuse 60 to 70% proximal to mid calcified stenoses with  diffuse 70% stenosis of the diagonal vessel.  Please intermediate had 50% proximal stenosis the circumflex 60% ostial OM1 stenosis in the large dominant RCA had severe 90 to 99% calcified proximal to mid stenoses.  He underwent successful CABG revascularization surgery the following day and had a LIMA placed to his LAD, SVG to PDA, SVG to OM1, and SVG to diagonal.  He developed postoperative atrial fibrillation and was treated with IV amiodarone with conversion to sinus rhythm.    He  subsequent was seen in our office by Jesus Lofts, PA-C on February 27 2020.  I saw him for initial post hospital evaluation with me on Apr 29, 2020.  At that time he was participating in cardiac rehab phase 2.  He had not had any recurrent atrial fibrillation.  At cardiac rehab his blood pressure was stable typically running in the 110 -735 range systolically.  He did not have any recurrent atrial fibrillation.  During that evaluation I recommended reduction of amiodarone to 100 mg for 2 weeks and then discontinuance and increased Toprol  succinate to 75 mg daily.  He underwent a follow-up echo Doppler study on June 09, 2020 which showed an EF of 55 to 60% without wall motion abnormalities.  There was grade 1 diastolic dysfunction.  Jesus Francis completed the cardiac rehab program.  He states his blood pressure at home typically runs in the 1 10-1 20 range and this was consistent at cardiac rehab.  However he does have "white coat "shoes and blood pressure always is elevated in a doctor's office.  Last week, he was bitten by a cat.  He has been on Augmentin.  He denies chest pain PND orthopnea.  He denies any awareness of heart rate irregularity.  He feels the best he has felt in years.  He presents for follow-up evaluation.   Past medical history is notable for hypertension, hyperlipidemia, history of kidney stones, history of internal hemorrhoids, remote history of hematuria with negative work-up by urology, history of carpal tunnel syndrome status post bilateral release, previous documentation of aortoiliac atherosclerosis seen on CT imaging in 2019.  Past surgical history is notable for his carpal tunnel release, hemorrhoidal surgery, as well as vocal cord surgery.  Current Medications: Outpatient Medications Prior to Visit  Medication Sig Dispense Refill  . aspirin EC 81 MG tablet Take 81 mg by mouth at bedtime.     Marland Kitchen ezetimibe (ZETIA) 10 MG tablet Take 1 tablet (10 mg total) by mouth daily. (Patient  taking differently: Take 10 mg by mouth every evening. ) 90 tablet 3  . metoprolol succinate (TOPROL-XL) 50 MG 24 hr tablet Take 1.5 tablets (75 mg total) by mouth daily. 135 tablet 3  . nitroGLYCERIN (NITROSTAT) 0.4 MG SL tablet Place 1 tablet (0.4 mg total) under the tongue every 5 (five) minutes as needed for chest pain. 90 tablet 3  . rosuvastatin (CRESTOR) 40 MG tablet Take 40 mg by mouth every evening.      No facility-administered medications prior to visit.     Allergies:   Patient has no known allergies.   Social History   Socioeconomic History  . Marital status: Widowed    Spouse name: Not on Francis  . Number of children: 2  . Years of education: 16  . Highest education level: Bachelor's degree (e.g., BA, AB, BS)  Occupational History  . Not on Francis  Tobacco Use  . Smoking status: Never Smoker  . Smokeless tobacco: Never Used  Vaping Use  .  Vaping Use: Never used  Substance and Sexual Activity  . Alcohol use: Yes    Alcohol/week: 3.0 standard drinks    Types: 1 Glasses of wine, 1 Cans of beer, 1 Shots of liquor per week  . Drug use: Never  . Sexual activity: Not on Francis  Other Topics Concern  . Not on Francis  Social History Narrative  . Not on Francis   Social Determinants of Health   Financial Resource Strain:   . Difficulty of Paying Living Expenses:   Food Insecurity:   . Worried About Charity fundraiser in the Last Year:   . Arboriculturist in the Last Year:   Transportation Needs:   . Film/video editor (Medical):   Marland Kitchen Lack of Transportation (Non-Medical):   Physical Activity:   . Days of Exercise per Week:   . Minutes of Exercise per Session:   Stress:   . Feeling of Stress :   Social Connections:   . Frequency of Communication with Friends and Family:   . Frequency of Social Gatherings with Friends and Family:   . Attends Religious Services:   . Active Member of Clubs or Organizations:   . Attends Archivist Meetings:   Marland Kitchen Marital  Status:      Social history is notable in that he is widowed for 5 years when his wife died at age 72.  He has 2 children ages 22 and 55 and his youngest daughter had a previous brain tumor.  He graduated from [employer].  He works for [employer] in [employer].  He does not smoke cigarettes.  There is a rare cigar use.  He drinks an occasional beer wine scotch or bourbon.  Family History:  The patient's family history includes Heart attack in his brother, brother, and father; Heart disease in his brother, sister, and sister.   His mother died at age 39.  His father died at age 32 but had a heart attack at age 64.  There are 6 siblings.  Brother age 35 who is alive and well, brother age 80 has a pacemaker and defibrillator, a brother died at age 83 with a heart attack and another brother died at age 42 with a heart attack.  One sister age 71 had a stent at age 91 and another sister at age 30 had bypass surgery at age 67.  ROS General: Negative; No fevers, chills, or night sweats;  HEENT: Negative; No changes in vision or hearing, sinus congestion, difficulty swallowing Pulmonary: Negative; No cough, wheezing, shortness of breath, hemoptysis Cardiovascular: See HPI GI: Negative; No nausea, vomiting, diarrhea, or abdominal pain GU: Negative; No dysuria, hematuria, or difficulty voiding Musculoskeletal: Positive for left radicular pain Hematologic/Oncology: Negative; no easy bruising, bleeding Endocrine: Negative; no heat/cold intolerance; no diabetes Neuro: Negative; no changes in balance, headaches Skin: Negative; No rashes or skin lesions Psychiatric: Negative; No behavioral problems, depression Sleep: Negative; No snoring, daytime sleepiness, hypersomnolence, bruxism, restless legs, hypnogognic hallucinations, no cataplexy Other comprehensive 14 point system review is negative.   PHYSICAL EXAM:   VS:  BP (!) 162/94   Pulse 68   Ht _0  (1.753 m)   Wt 204 lb 3.2 oz (92.6 kg)   BMI 30.16  kg/m     Repeat blood pressure by me was 152/80  Wt Readings from Last 3 Encounters:  08/04/20 204 lb 3.2 oz (92.6 kg)  05/22/20 198 lb 13.7 oz (90.2 kg)  04/29/20 202 lb (  91.6 kg)    General: Alert, oriented, no distress.  Skin: normal turgor, no rashes, warm and dry HEENT: Normocephalic, atraumatic. Pupils equal round and reactive to light; sclera anicteric; extraocular muscles intact;  Nose without nasal septal hypertrophy Mouth/Parynx benign; Mallinpatti scale 3 Neck: No JVD, no carotid bruits; normal carotid upstroke Lungs: clear to ausculatation and percussion; no wheezing or rales Chest wall: without tenderness to palpitation Heart: PMI not displaced, RRR, s1 s2 normal, 1/6 systolic murmur, no diastolic murmur, no rubs, gallops, thrills, or heaves Abdomen: soft, nontender; no hepatosplenomehaly, BS+; abdominal aorta nontender and not dilated by palpation. Back: no CVA tenderness Pulses 2+ Musculoskeletal: full range of motion, normal strength, no joint deformities Extremities: Puncture marks in right arm from his fiance's cat bite last week; no clubbing cyanosis or edema, Homan's sign negative  Neurologic: grossly nonfocal; Cranial nerves grossly wnl Psychologic: Normal mood and affect   Studies/Labs Reviewed:   EKG:  EKG is ordered today.  ECG (independently read by me): Normal sinus rhythm at 68 bpm.  Nondiagnostic T wave abnormality.  Normal intervals.  No ectopy.  Apr 29, 2020 ECG (independently read by me): Normal sinus rhythm at 68 bpm.  Small inferior Q waves and early transition.  February 05, 2020 ECG (independently read by me): Normal sinus rhythm at 85 bpm with mild sinus arrhythmia, small inferior Q waves with possible inferior infarct.  QTc interval 428 ms..  Number 150 ms  January 02, 2020 ECG (independently read by me): Normal sinus rhythm at 81 bpm.  Small inferior Q waves in leads III and aVF suggestive of  age-indeterminate inferior infarct.  Early  transition.  Normal intervals.  No ectopy  Recent Labs: BMP Latest Ref Rng & Units 02/16/2020 02/15/2020 02/14/2020  Glucose 70 - 99 mg/dL 112(H) 149(H) 193(H)  BUN 8 - 23 mg/dL _0 Creatinine 0.61 - 1.24 mg/dL 1.07 0.96 0.91  BUN/Creat Ratio 10 - 24 - - -  Sodium 135 - 145 mmol/L 139 135 135  Potassium 3.5 - 5.1 mmol/L 3.9 4.0 4.3  Chloride 98 - 111 mmol/L 101 99 100  CO2 22 - 32 mmol/L _1 Calcium 8.9 - 10.3 mg/dL 9.2 8.9 8.7(L)     Hepatic Function Latest Ref Rng & Units 02/11/2020 01/28/2020  Total Protein 6.5 - 8.1 g/dL 6.6 7.1  Albumin 3.5 - 5.0 g/dL 3.3(L) 4.3  AST 15 - 41 U/L 22 21  ALT 0 - 44 U/L 26 22  Alk Phosphatase 38 - 126 U/L 43 56  Total Bilirubin 0.3 - 1.2 mg/dL 0.8 0.3    CBC Latest Ref Rng & Units 02/16/2020 02/15/2020 02/14/2020  WBC 4.0 - 10.5 K/uL 14.7(H) 17.7(H) 21.3(H)  Hemoglobin 13.0 - 17.0 g/dL 10.3(L) 9.7(L) 9.1(L)  Hematocrit 39 - 52 % 31.0(L) 29.7(L) 27.6(L)  Platelets 150 - 400 K/uL 185 160 128(L)   Lab Results  Component Value Date   MCV 93.7 02/16/2020   MCV 93.7 02/15/2020   MCV 96.2 02/14/2020   No results found for: TSH Lab Results  Component Value Date   HGBA1C 6.4 (H) 02/11/2020     BNP No results found for: BNP  ProBNP No results found for: PROBNP   Lipid Panel     Component Value Date/Time   CHOL 128 03/30/2020 0950   TRIG 91 03/30/2020 0950   HDL 49 03/30/2020 0950   CHOLHDL 2.6 03/30/2020 0950   LDLCALC 62 03/30/2020 0950   LABVLDL 17 03/30/2020 0950  RADIOLOGY: No results found.   Additional studies/ records that were reviewed today include:  I reviewed the records from Dr. Marijo Francis  Laboratory from December 25, 2019: Total cholesterol 318, HDL 45, LDL 240, triglycerides 170 HDL 6.1, consistent with prediabetes Creatinine 1.02.  Echo Doppler study, coronary CTA and FFR analysis were reviewed and thoroughly discussed with the patient.   Ramus lesion is 50% stenosed.  Dist LM to Ost LAD  lesion is 30% stenosed.  1st Mrg lesion is 60% stenosed.  Prox LAD lesion is 90% stenosed.  Prox LAD to Mid LAD lesion is 60% stenosed.  Mid LAD lesion is 70% stenosed.  Prox RCA lesion is 99% stenosed.  Ost RCA to Prox RCA lesion is 90% stenosed.  Mid RCA lesion is 50% stenosed.  Dist RCA lesion is 20% stenosed.   Severe coronary calcification with multivessel CAD.  There is 30% distal narrowing of the left main coronary artery prior to its trifurcation into the LAD, ramus intermediate, and left circumflex vessel. The LAD has 90% very proximal (near ostial) stenosis followed by diffuse 60 and 70% proximal to mid calcified stenoses with diffuse 70% stenosis in the diagonal vessel; 50% very proximal ramus intermediate stenosis; 60% ostial OM1 stenosis and large dominant RCA with 90% proximal followed by 99% calcified proximal to mid stenosis with 50% mid stenosis and mild 20% areas of luminal irregularity.  LVEDP 16 mmHg.  RECOMMENDATION: Surgical consultation for CABG surgery.  The patient will be admitted to the hospital.  He will be hydrated post procedure, and started on heparin therapy.  Aggressive lipid-lowering therapy will be necessary with potential need for PCSK9 inhibition.     ASSESSMENT:    1. Coronary artery disease involving native coronary artery of native heart with angina pectoris ([place])   2. Hx of CABG   3. PAF (paroxysmal atrial fibrillation) ([place])   4. Hyperlipidemia with target LDL less than 70   5. Essential hypertension     PLAN:  Jesus Francis is a 64 year old gentleman who has a history of hypertension, significant hyperlipidemia as well as family history for heart disease.  The patient has had markedly elevated LDL levels in the past suggesting a high likelihood for familial hyperlipidemia.  His  lipid panel in December 2020 has shown a total cholesterol of 318 with an LDL cholesterol of 240.  The patient  had experienced an episode where he  passed out being and subsequently has noticed some exertional chest pressure.  When I saw him for initial evaluation his ECG showed evidence for inferior Q waves with early transition suggesting the possibility that he may have had an inferior posterior MI in the past.  He had recently been found to have stage II hypertension and was started on valsartan 160 mg daily.  At his initial evaluation with me I reviewed new hypertensive guidelines and with his chest discomfort and initially low-dose metoprolol succinate at 25 mg daily.  He was found to have a markedly abnormal coronary CTA and subsequent cardiac catheterization demonstrated severe multivessel CAD.  He underwent successful CABG surgery x4 the following day by Dr. Kipp Brood.  His postoperative course was complicated by atrial fibrillation for which she was started on amiodarone therapy.  He has subsequently been tapered off amiodarone and has been maintaining sinus rhythm with metoprolol succinate 75 mg daily.  He completed phase 2 of the cardiac rehab.  He states his blood pressure at cardiac rehab typically ran in the 1 10-1 20  range systolically.  His blood pressure today continues to be elevated.  I reviewed his echo Doppler study and with grade 1 diastolic dysfunction suggested the addition of low-dose losartan at 25 mg daily.  The patient is adamant that his blood pressure at home is typically low and ranges around 110/60.  He would prefer not to initiate therapy.  I discussed with him the importance of close observation and also to make certain his blood pressure machine is calibrated correctly.  He continues to be on rosuvastatin 40 mg and Zetia 10 mg and most recent lipid panel in April 2021 showed an LDL cholesterol at 62 with total cholesterol improved to 128.  He is active and has been without recurrent anginal symptoms.  He tells me he will be seeing Dr. Donnetta Hutching follow-up of carotid disease.  He will be following up with Dr. Marijo Francis for  primary care.  He will be getting married on October 2.  I will see him in 6 months for cardiology follow-up evaluation.   Medication Adjustments/Labs and Tests Ordered: Current medicines are reviewed at length with the patient today.  Concerns regarding medicines are outlined above.  Medication changes, Labs and Tests ordered today are listed in the Patient Instructions below. Patient Instructions  Medication Instructions:  CONTINUE WITH CURRENT MEDICATIONS. NO CHANGES.  *If you need a refill on your cardiac medications before your next appointment, please call your pharmacy*   Follow-Up: At [facility], you and your health needs are our priority.  As part of our continuing mission to provide you with exceptional heart care, we have created designated Provider Care Teams.  These Care Teams include your primary Cardiologist (physician) and Advanced Practice Providers (APPs -  Physician Assistants and Nurse Practitioners) who all work together to provide you with the care you need, when you need it.  We recommend signing up for the patient portal called "MyChart".  Sign up information is provided on this After Visit Summary.  MyChart is used to connect with patients for Virtual Visits (Telemedicine).  Patients are able to view lab/test results, encounter notes, upcoming appointments, etc.  Non-urgent messages can be sent to your provider as well.   To learn more about what you can do with MyChart, go to [email or web address].    Your next appointment:   6 month(s)  The format for your next appointment:   In Person  Provider:   Shelva Majestic, MD       Signed, Jesus Majestic, MD  08/04/2020 8:34 AM    [facility] [address] [place], [place], [place]  [address] Phone: [phone]  He had discontinued this nature recommended with the spironolactone and that I would get that maybe about a week or 2 his digestive follow-up make sure the potassium does  not allow

## 2020-10-19 ENCOUNTER — Other Ambulatory Visit: Payer: Self-pay | Admitting: Cardiovascular Disease

## 2020-10-19 DIAGNOSIS — I209 Angina pectoris, unspecified: Secondary | ICD-10-CM

## 2020-10-19 DIAGNOSIS — R9431 Abnormal electrocardiogram [ECG] [EKG]: Secondary | ICD-10-CM

## 2020-10-19 DIAGNOSIS — I10 Essential (primary) hypertension: Secondary | ICD-10-CM

## 2020-10-22 MED ORDER — ROSUVASTATIN CALCIUM 40 MG PO TABS: 40.0000 mg | ORAL_TABLET | Freq: Every evening | ORAL | 3 refills | Status: DC

## 2020-12-04 ENCOUNTER — Ambulatory Visit (INDEPENDENT_AMBULATORY_CARE_PROVIDER_SITE_OTHER): Payer: No Typology Code available for payment source | Admitting: Physician Assistant

## 2020-12-04 ENCOUNTER — Other Ambulatory Visit: Payer: Self-pay

## 2020-12-04 ENCOUNTER — Ambulatory Visit (HOSPITAL_COMMUNITY)
Admission: RE | Admit: 2020-12-04 | Discharge: 2020-12-04 | Disposition: A | Discharge status: Home / Self Care | Payer: No Typology Code available for payment source | Source: Ambulatory Visit | Attending: Physician Assistant | Admitting: Physician Assistant

## 2020-12-04 VITALS — BP 142/86 | HR 87 | Temp 98.3°F | Resp 20 | Ht 69.0 in | Wt 206.3 lb

## 2020-12-04 DIAGNOSIS — I6522 Occlusion and stenosis of left carotid artery: Secondary | ICD-10-CM | POA: Insufficient documentation

## 2020-12-04 DIAGNOSIS — I6523 Occlusion and stenosis of bilateral carotid arteries: Secondary | ICD-10-CM

## 2020-12-04 NOTE — Progress Notes (Signed)
HISTORY AND PHYSICAL     CC:  follow up. Requesting Provider:  Antony Contras, MD  HPI: This is a 64 y.o. male here for follow up for carotid artery stenosis.   Discussion with Dr. Donnetta Hutching and pt, pt had a very clear cut left brain TIA several months prior to presentation Pt is s/p left CEA  by Dr. Donnetta Hutching with CABG x 4 by Dr. Kipp Brood on 02/12/2020.   His post op course was complicated by afib.    Pt was last seen 03/03/2020 by Dr. Donnetta Hutching and at that time he was doing well without any neurological deficits.  He was scheduled for 9 month follow up and is here today for that visit.   Pt returns today for follow up.  Pt denies any amaurosis fugax, speech difficulties, weakness, numbness, paralysis or clumsiness.  He states he has been doing very well.  He states his cholesterol is under better control.    Pt is here today with his wife as they recently got married in October.   The pt is on a statin for cholesterol management.  The pt is on a daily aspirin.   Other AC:  none The pt is on BB for hypertension.   The pt is not diabetic.   Tobacco hx:  never  Pt thinks he may have a 1st cousin who had a stent placed in aorta.  Pt had CT scan in 2019 and no aortic aneurysm was identified.  Past Medical History:  Diagnosis Date  . Carotid artery occlusion   . Coronary artery disease   . Hyperlipidemia   . Hypertension     Past Surgical History:  Procedure Laterality Date  . CARDIAC CATHETERIZATION    . CAROTID ENDARTERECTOMY    . CORONARY ARTERY BYPASS GRAFT N/A 02/12/2020   Procedure: CORONARY ARTERY BYPASS GRAFTING (CABG) times four, using left internal mammary artery and right greater saphenous vein harvested endoscopically.;  Surgeon: Lajuana Matte, MD;  Location: [place];  Service: Open Heart Surgery;  Laterality: N/A;  . ENDARTERECTOMY Left 02/12/2020   Procedure: ENDARTERECTOMY CAROTID;  Surgeon: Rosetta Posner, MD;  Location: [place];  Service: Vascular;  Laterality: Left;  . LEFT  HEART CATH AND CORONARY ANGIOGRAPHY N/A 02/11/2020   Procedure: LEFT HEART CATH AND CORONARY ANGIOGRAPHY;  Surgeon: Troy Sine, MD;  Location: [place] [place] [facility];  Service: Cardiovascular;  Laterality: N/A;  . TEE WITHOUT CARDIOVERSION N/A 02/12/2020   Procedure: TRANSESOPHAGEAL ECHOCARDIOGRAM (TEE);  Surgeon: Lajuana Matte, MD;  Location: [place];  Service: Open Heart Surgery;  Laterality: N/A;    No Known Allergies  Current Outpatient Medications  Medication Sig Dispense Refill  . aspirin EC 81 MG tablet Take 81 mg by mouth at bedtime.     Marland Kitchen ezetimibe (ZETIA) 10 MG tablet TAKE ONE TABLET BY MOUTH DAILY 90 tablet 3  . metoprolol succinate (TOPROL-XL) 50 MG 24 hr tablet Take 1.5 tablets (75 mg total) by mouth daily. 135 tablet 3  . nitroGLYCERIN (NITROSTAT) 0.4 MG SL tablet Place 1 tablet (0.4 mg total) under the tongue every 5 (five) minutes as needed for chest pain. 90 tablet 3  . rosuvastatin (CRESTOR) 40 MG tablet Take 1 tablet (40 mg total) by mouth every evening. 90 tablet 3   No current facility-administered medications for this visit.    Family History  Problem Relation Age of Onset  . Heart attack Father   . Heart disease Sister   . Heart disease Brother   .  Heart attack Brother   . Heart attack Brother   . Heart disease Sister     Social History   Socioeconomic History  . Marital status: Widowed    Spouse name: Not on file  . Number of children: 2  . Years of education: 16  . Highest education level: Bachelor's degree (e.g., BA, AB, BS)  Occupational History  . Not on file  Tobacco Use  . Smoking status: Never Smoker  . Smokeless tobacco: Never Used  Vaping Use  . Vaping Use: Never used  Substance and Sexual Activity  . Alcohol use: Yes    Alcohol/week: 3.0 standard drinks    Types: 1 Glasses of wine, 1 Cans of beer, 1 Shots of liquor per week  . Drug use: Never  . Sexual activity: Not on file  Other Topics Concern  . Not on file  Social History  Narrative  . Not on file   Social Determinants of Health   Financial Resource Strain: Not on file  Food Insecurity: Not on file  Transportation Needs: Not on file  Physical Activity: Not on file  Stress: Not on file  Social Connections: Not on file  Intimate Partner Violence: Not on file     REVIEW OF SYSTEMS:   [X]  denotes positive finding, [ ]  denotes negative finding Cardiac  Comments:  Chest pain or chest pressure:    Shortness of breath upon exertion:    Short of breath when lying flat:    Irregular heart rhythm:        Vascular    Pain in calf, thigh, or hip brought on by ambulation:    Pain in feet at night that wakes you up from your sleep:     Blood clot in your veins:    Leg swelling:         Pulmonary    Oxygen at home:    Productive cough:     Wheezing:         Neurologic    Sudden weakness in arms or legs:     Sudden numbness in arms or legs:     Sudden onset of difficulty speaking or slurred speech:    Temporary loss of vision in one eye:     Problems with dizziness:         Gastrointestinal    Blood in stool:     Vomited blood:         Genitourinary    Burning when urinating:     Blood in urine:        Psychiatric    Major depression:         Hematologic    Bleeding problems:    Problems with blood clotting too easily:        Skin    Rashes or ulcers:        Constitutional    Fever or chills:      PHYSICAL EXAMINATION:  Today's Vitals   12/04/20 1329 12/04/20 1333  BP: (!) 155/97 (!) 142/86  Pulse: 87   Resp: 20   Temp: 98.3 F (36.8 C)   TempSrc: Oral   SpO2: 97%   Weight: 206 lb 4.8 oz (93.6 kg)   Height: 5\' 9"  (1.753 m)    Body mass index is 30.47 kg/m.   General:  WDWN in NAD; vital signs documented above Gait: Not observed HENT: WNL, normocephalic Pulmonary: normal non-labored breathing Cardiac: regular HR, without murmur without carotid bruits Abdomen: soft, NT, no  masses; aortic pulse is not palpable Skin:  without rashes Vascular Exam/Pulses:  Right Left  Radial Unable to palpate 2+ (normal)  Ulnar 1+ (weak) Unable to palpate   Extremities: without ischemic changes, without Gangrene , without cellulitis; without open wounds Musculoskeletal: no muscle wasting or atrophy  Neurologic: A&O X 3 Psychiatric:  The pt has Normal affect.   Non-Invasive Vascular Imaging:   Carotid Duplex on 12/04/2020: Right:  40-59% ICA stenosis Left:  1-39% ICA stenosis Vertebrals: Bilateral vertebral arteries demonstrate antegrade flow.  Subclavians: Normal flow hemodynamics were seen in bilateral subclavian arteries  Previous Carotid duplex on 02/11/2020: Right: 40-59% ICA stenosis Left:   String sign and s/p left CEA    ASSESSMENT/PLAN:: 64 y.o. male here for follow up carotid artery stenosis and is s/p left CEA by Dr. Donnetta Hutching combined with CABG x 4 by Dr. Kipp Brood in February 2021.  -duplex today reveals the right ICA stenosis remains 40-59%.  The left is 1-39% since left CEA earlier this year.  Pt is doing quite well and remains asymptomatic.  Discussed with he and his wife that we would not do surgery on the right side unless it reaches greater than 80% stenosis or he becomes symptomatic.   -discussed s/s of stroke with pt and he understands should he develop any of these sx, he will go to the nearest ER. -pt will f/u in one year with carotid duplex -continue statin/asa -pt with 1st cousin with possible hx of AAA-CT scan from 2019 revealed no evidence of AAA. -pt will call sooner should they have any issues.   Leontine Locket, [facility] [phone]  Clinic MD:  Donzetta Matters

## 2021-02-04 ENCOUNTER — Other Ambulatory Visit: Payer: Self-pay

## 2021-02-04 ENCOUNTER — Encounter: Payer: Self-pay | Admitting: Cardiovascular Disease

## 2021-02-04 ENCOUNTER — Ambulatory Visit (INDEPENDENT_AMBULATORY_CARE_PROVIDER_SITE_OTHER): Payer: No Typology Code available for payment source | Admitting: Cardiovascular Disease

## 2021-02-04 VITALS — BP 140/82 | HR 77 | Ht 69.0 in | Wt 202.4 lb

## 2021-02-04 DIAGNOSIS — I2510 Atherosclerotic heart disease of native coronary artery without angina pectoris: Secondary | ICD-10-CM | POA: Diagnosis not present

## 2021-02-04 DIAGNOSIS — Z951 Presence of aortocoronary bypass graft: Secondary | ICD-10-CM

## 2021-02-04 DIAGNOSIS — E785 Hyperlipidemia, unspecified: Secondary | ICD-10-CM

## 2021-02-04 DIAGNOSIS — Z9889 Other specified postprocedural states: Secondary | ICD-10-CM | POA: Diagnosis not present

## 2021-02-04 DIAGNOSIS — I480 Paroxysmal atrial fibrillation: Secondary | ICD-10-CM

## 2021-02-04 DIAGNOSIS — I10 Essential (primary) hypertension: Secondary | ICD-10-CM

## 2021-02-04 LAB — COMPREHENSIVE METABOLIC PANEL
ALT: 27 IU/L (ref 0–44)
AST: 27 IU/L (ref 0–40)
Albumin/Globulin Ratio: 1.5 (ref 1.2–2.2)
Albumin: 4.4 g/dL (ref 3.8–4.8)
Alkaline Phosphatase: 67 IU/L (ref 44–121)
BUN/Creatinine Ratio: 16 (ref 10–24)
BUN: 16 mg/dL (ref 8–27)
Bilirubin Total: 0.5 mg/dL (ref 0.0–1.2)
CO2: 24 mmol/L (ref 20–29)
Calcium: 9.6 mg/dL (ref 8.6–10.2)
Chloride: 102 mmol/L (ref 96–106)
Creatinine, Ser: 0.99 mg/dL (ref 0.76–1.27)
GFR calc Af Amer: 92 mL/min/{1.73_m2} (ref >59)
GFR calc non Af Amer: 80 mL/min/{1.73_m2} (ref >59)
Globulin, Total: 3 g/dL (ref 1.5–4.5)
Glucose: 128 mg/dL — ABNORMAL HIGH (ref 65–99)
Potassium: 5.3 mmol/L — ABNORMAL HIGH (ref 3.5–5.2)
Sodium: 140 mmol/L (ref 134–144)
Total Protein: 7.4 g/dL (ref 6.0–8.5)

## 2021-02-04 LAB — TSH: TSH: 2.33 u[IU]/mL (ref 0.450–4.500)

## 2021-02-04 LAB — LIPID PANEL
Chol/HDL Ratio: 2.9 ratio (ref 0.0–5.0)
Cholesterol, Total: 134 mg/dL (ref 100–199)
HDL: 47 mg/dL (ref >39)
LDL Chol Calc (NIH): 72 mg/dL (ref 0–99)
Triglycerides: 78 mg/dL (ref 0–149)
VLDL Cholesterol Cal: 15 mg/dL (ref 5–40)

## 2021-02-04 LAB — CBC
Hematocrit: 46.4 % (ref 37.5–51.0)
Hemoglobin: 15.6 g/dL (ref 13.0–17.7)
MCH: 30.9 pg (ref 26.6–33.0)
MCHC: 33.6 g/dL (ref 31.5–35.7)
MCV: 92 fL (ref 79–97)
Platelets: 228 10*3/uL (ref 150–450)
RBC: 5.05 x10E6/uL (ref 4.14–5.80)
RDW: 12.7 % (ref 11.6–15.4)
WBC: 10.8 10*3/uL (ref 3.4–10.8)

## 2021-02-04 NOTE — Progress Notes (Signed)
Cardiology Office Note    Date:  02/05/2021   ID:  Jesus Francis, DOB 1956-02-08, MRN [id number]  PCP: Dr. Marijo File Cardiologist:  Shelva Majestic, MD   F/U cardiology evaluation initially sent through the courtesy of Dr. Marijo File for evaluation of exertional chest discomfort.  History of Present Illness:  Jesus Francis is a 65 y.o. male who was initially referred for through the courtesy of Dr. Marijo File for exertional chest pressure.  He presents for a 82-month follow-up evaluation.    Jesus Francis has a history of hypertension as well as hyperlipidemia.  In November 2018, LDL cholesterol was 264 and apparently was against statin therapy.  He states that over 10 years ago he did experience some chest discomfort and apparently had a stress test.  He was told that this was normal but never saw a cardiologist.  The patient has issues with bone spurs involving his cervical and thoracic spine.  He states that around Thanksgiving he became upset and apparently passed out for short duration.  He did note chest pressure.  Subsequently, he has noted some exertional chest pressure with activity.  Remotely he used to work out hard but admits that over the past 2 years he really has not exercised to any capacity.  He does admit to being under increased stress and that his wife died 7 years ago at age 74.  His youngest daughter also had brain cancer.  He was recently evaluated by Dr. Marijo File on December 25, 2019.  His ECG raise concern for prior heart attack.  There was no ECG to compare to laboratory showed a total cholesterol of 318, HDL 45, LDL 240, triglycerides 170.  Hemoglobin A1c was 6.1.  Creatinine 1.02.  LFTs were normal.  During his evaluation, his blood pressure was elevated at 172/102 and he was started on valsartan 160 mg he also was started on rosuvastatin 40 mg in light of his significant hyperlipidemia.    When I saw him for initial evaluation on January 02, 2020, with his  exertional change symptomatology I recommended initiation of metoprolol succinate 25 mg which would also assist in his blood pressure control.  I recommended an echo Doppler study as well as coronary CTA evaluation with possible FFR.  If LDL continues to be elevated on maximal therapy I discussed potential future PCSK9 inhibition.  His echo Doppler study on January 08, 2020 demonstrated normal systolic function with EF 60 to 65%.  There was mildly increased left ventricular posterior wall thickness without LVH.  Coronary CTA was significant abnormal with a coronary calcium score 2195, 98th percentile for age and sex matched control.  He was found to have severe calcified plaque in the proximal RCA and proximal to mid LAD.  Subsequent FFR analysis suggested hemodynamic significance with mid LAD FFR less than 0.5 and circumflex FFR 0.77.  The proximal RCA FFR was 0.74.  He was felt after occlusion of the mid RCA.  Jesus Francis has felt improved with the addition of Toprol added to his valsartan.  I saw him as an add-on on February 04 2019 in follow-up of his severely abnormal coronary CTA.    He underwent cardiac catheterization on February 11 2020 which revealed severe coronary calcification with multivessel CAD.  There was 30% distal left main narrowing prior to its trifurcation into the LAD, ramus and circumflex vessel.  The LAD had 95% near ostial stenosis followed by diffuse 60 to 70% proximal to mid calcified stenoses  with diffuse 70% stenosis of the diagonal vessel.  Please intermediate had 50% proximal stenosis the circumflex 60% ostial OM1 stenosis in the large dominant RCA had severe 90 to 99% calcified proximal to mid stenoses.  Preoperative screening revealed high-grade left carotid stenosis.  He underwent successful CABG revascularization surgery the following day and had a LIMA placed to his LAD, SVG to PDA, I last saw him SVG to OM1, and SVG to diagonal and left carotid endarterectomy by Dr.  Donnetta Hutching.  He developed postoperative atrial fibrillation and was treated with IV amiodarone with conversion to sinus rhythm.    He subsequent was seen in our office by [name], PA-C on February 27 2020.  I saw him for initial post hospital evaluation with me on Apr 29, 2020.  At that time he was participating in cardiac rehab phase 2.  He had not had any recurrent atrial fibrillation.  At cardiac rehab his blood pressure was stable typically running in the 110 -245 range systolically.  He did not have any recurrent atrial fibrillation.  During that evaluation I recommended reduction of amiodarone to 100 mg for 2 weeks and then discontinuance and increased Toprol  succinate to 75 mg daily.  He underwent a follow-up echo Doppler study on June 09, 2020 which showed an EF of 55 to 60% without wall motion abnormalities.  There was grade 1 diastolic dysfunction.  I last saw him in August 2021.  Mr. Jesus Francis completed the cardiac rehab program.  He states his blood pressure at home typically runs in the 110-120 range and this was consistent at cardiac rehab.  However he does have "white coat "shoes and blood pressure always is elevated in a doctor's office.  Last week, he was bitten by a cat and was treated with Augmentin.  He denied chest pain PND orthopnea.  He denied any awareness of heart rate irregularity.  He felt the best he has felt in years.    Since I last saw him, he was married on September 26, 2020.  He has continued to remain stable and specifically denies any recurrent anginal symptomatology or exertional dyspnea.  He continues to be on metoprolol succinate 75 mg daily in addition to Zetia and rosuvastatin 40 mg and aspirin.  He presents for follow-up evaluation.   Past medical history is notable for hypertension, hyperlipidemia, history of kidney stones, history of internal hemorrhoids, remote history of hematuria with negative work-up by urology, history of carpal tunnel syndrome status post  bilateral release, previous documentation of aortoiliac atherosclerosis seen on CT imaging in 2019.  Past surgical history is notable for his carpal tunnel release, hemorrhoidal surgery, as well as vocal cord surgery.  Current Medications: Outpatient Medications Prior to Visit  Medication Sig Dispense Refill  . aspirin EC 81 MG tablet Take 81 mg by mouth at bedtime.     Marland Kitchen ezetimibe (ZETIA) 10 MG tablet TAKE ONE TABLET BY MOUTH DAILY 90 tablet 3  . metoprolol succinate (TOPROL-XL) 50 MG 24 hr tablet Take 1.5 tablets (75 mg total) by mouth daily. 135 tablet 3  . rosuvastatin (CRESTOR) 40 MG tablet Take 1 tablet (40 mg total) by mouth every evening. 90 tablet 3  . nitroGLYCERIN (NITROSTAT) 0.4 MG SL tablet Place 1 tablet (0.4 mg total) under the tongue every 5 (five) minutes as needed for chest pain. 90 tablet 3   No facility-administered medications prior to visit.     Allergies:   Patient has no known allergies.   Social  History   Socioeconomic History  . Marital status: Widowed    Spouse name: Not on file  . Number of children: 2  . Years of education: 16  . Highest education level: Bachelor's degree (e.g., BA, AB, BS)  Occupational History  . Not on file  Tobacco Use  . Smoking status: Never Smoker  . Smokeless tobacco: Never Used  Vaping Use  . Vaping Use: Never used  Substance and Sexual Activity  . Alcohol use: Yes    Alcohol/week: 3.0 standard drinks    Types: 1 Glasses of wine, 1 Cans of beer, 1 Shots of liquor per week  . Drug use: Never  . Sexual activity: Not on file  Other Topics Concern  . Not on file  Social History Narrative  . Not on file   Social Determinants of Health   Financial Resource Strain: Not on file  Food Insecurity: Not on file  Transportation Needs: Not on file  Physical Activity: Not on file  Stress: Not on file  Social Connections: Not on file     Social history is notable in that he was widowed for 5 years when his wife died at age  60.  He has 2 children ages 35 and 30 and his youngest daughter had a previous brain tumor.  He graduated from [employer].  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  He does not smoke cigarettes.  There is a rare cigar use.  He drinks an occasional beer wine scotch or bourbon.  He was remarried on September 26, 2020  Family History:  The patient's family history includes Heart attack in his brother, brother, and father; Heart disease in his brother, sister, and sister.   His mother died at age 31.  His father died at age 76 but had a heart attack at age 33.  There are 6 siblings.  Brother age 55 who is alive and well, brother age 67 has a pacemaker and defibrillator, a brother died at age 24 with a heart attack and another brother died at age 70 with a heart attack.  One sister age 63 had a stent at age 33 and another sister at age 49 had bypass surgery at age 23.  ROS General: Negative; No fevers, chills, or night sweats;  HEENT: Negative; No changes in vision or hearing, sinus congestion, difficulty swallowing Pulmonary: Negative; No cough, wheezing, shortness of breath, hemoptysis Cardiovascular: See HPI GI: Negative; No nausea, vomiting, diarrhea, or abdominal pain GU: Negative; No dysuria, hematuria, or difficulty voiding Musculoskeletal: Positive for left radicular pain Hematologic/Oncology: Negative; no easy bruising, bleeding Endocrine: Negative; no heat/cold intolerance; no diabetes Neuro: Negative; no changes in balance, headaches Skin: Negative; No rashes or skin lesions Psychiatric: Negative; No behavioral problems, depression Sleep: Negative; No snoring, daytime sleepiness, hypersomnolence, bruxism, restless legs, hypnogognic hallucinations, no cataplexy Other comprehensive 14 point system review is negative.   PHYSICAL EXAM:   VS:  BP 140/82   Pulse 77   Ht 5\' 9"  (1.753 m)   Wt 202 lb 6.4 oz (91.8 kg)   SpO2 95%   BMI 29.89 kg/m     Repeat blood pressure by me was 122/76  Wt  Readings from Last 3 Encounters:  02/04/21 202 lb 6.4 oz (91.8 kg)  12/04/20 206 lb 4.8 oz (93.6 kg)  08/04/20 204 lb 3.2 oz (92.6 kg)    General: Alert, oriented, no distress.  Skin: normal turgor, no rashes, warm and dry HEENT: Normocephalic, atraumatic. Pupils equal  round and reactive to light; sclera anicteric; extraocular muscles intact;  Nose without nasal septal hypertrophy Mouth/Parynx benign; Mallinpatti scale 3 Neck: No JVD, no carotid bruits; normal carotid upstroke Lungs: clear to ausculatation and percussion; no wheezing or rales Chest wall: without tenderness to palpitation Heart: PMI not displaced, RRR, s1 s2 normal, 1/6 systolic murmur, no diastolic murmur, no rubs, gallops, thrills, or heaves Abdomen: soft, nontender; no hepatosplenomehaly, BS+; abdominal aorta nontender and not dilated by palpation. Back: no CVA tenderness Pulses 2+ Musculoskeletal: full range of motion, normal strength, no joint deformities Extremities: no clubbing cyanosis or edema, Homan's sign negative  Neurologic: grossly nonfocal; Cranial nerves grossly wnl Psychologic: Normal mood and affect   Studies/Labs Reviewed:   EKG:  EKG is ordered today.  NSR at 77, Small Q wave inferiorly, no ectopy, normal intervals  August 10, 202ECG (independently read by me): Normal sinus rhythm at 68 bpm.  Nondiagnostic T wave abnormality.  Normal intervals.  No ectopy.  Apr 29, 2020 ECG (independently read by me): Normal sinus rhythm at 68 bpm.  Small inferior Q waves and early transition.  February 05, 2020 ECG (independently read by me): Normal sinus rhythm at 85 bpm with mild sinus arrhythmia, small inferior Q waves with possible inferior infarct.  QTc interval 428 ms..  Number 150 ms  January 02, 2020 ECG (independently read by me): Normal sinus rhythm at 81 bpm.  Small inferior Q waves in leads III and aVF suggestive of  age-indeterminate inferior infarct.  Early transition.  Normal intervals.  No  ectopy  Recent Labs: BMP Latest Ref Rng & Units 02/04/2021 02/16/2020 02/15/2020  Glucose 65 - 99 mg/dL 128(H) 112(H) 149(H)  BUN 8 - 27 mg/dL $Remove'16 16 16  'YrXZbIi$ Creatinine 0.76 - 1.27 mg/dL 0.99 1.07 0.96  BUN/Creat Ratio 10 - 24 16 - -  Sodium 134 - 144 mmol/L 140 139 135  Potassium 3.5 - 5.2 mmol/L 5.3(H) 3.9 4.0  Chloride 96 - 106 mmol/L 102 101 99  CO2 20 - 29 mmol/L $RemoveB'24 28 26  'tXsQZXwv$ Calcium 8.6 - 10.2 mg/dL 9.6 9.2 8.9     Hepatic Function Latest Ref Rng & Units 02/04/2021 02/11/2020 01/28/2020  Total Protein 6.0 - 8.5 g/dL 7.4 6.6 7.1  Albumin 3.8 - 4.8 g/dL 4.4 3.3(L) 4.3  AST 0 - 40 IU/L $Remov'27 22 21  'hqSKmr$ ALT 0 - 44 IU/L $Remov'27 26 22  'WuRdSg$ Alk Phosphatase 44 - 121 IU/L 67 43 56  Total Bilirubin 0.0 - 1.2 mg/dL 0.5 0.8 0.3    CBC Latest Ref Rng & Units 02/04/2021 02/16/2020 02/15/2020  WBC 3.4 - 10.8 x10E3/uL 10.8 14.7(H) 17.7(H)  Hemoglobin 13.0 - 17.7 g/dL 15.6 10.3(L) 9.7(L)  Hematocrit 37.5 - 51.0 % 46.4 31.0(L) 29.7(L)  Platelets 150 - 450 x10E3/uL 228 185 160   Lab Results  Component Value Date   MCV 92 02/04/2021   MCV 93.7 02/16/2020   MCV 93.7 02/15/2020   Lab Results  Component Value Date   TSH 2.330 02/04/2021   Lab Results  Component Value Date   HGBA1C 6.4 (H) 02/11/2020     BNP No results found for: BNP  ProBNP No results found for: PROBNP   Lipid Panel     Component Value Date/Time   CHOL 134 02/04/2021 0949   TRIG 78 02/04/2021 0949   HDL 47 02/04/2021 0949   CHOLHDL 2.9 02/04/2021 0949   LDLCALC 72 02/04/2021 0949   LABVLDL 15 02/04/2021 0949     RADIOLOGY: No results  found.   Additional studies/ records that were reviewed today include:  I reviewed the records from Dr. Marijo File  Laboratory from December 25, 2019: Total cholesterol 318, HDL 45, LDL 240, triglycerides 170 HDL 6.1, consistent with prediabetes Creatinine 1.02.  Echo Doppler study, coronary CTA and FFR analysis were reviewed and thoroughly discussed with the patient.  February 11, 2020  Ramus lesion is 50% stenosed.  Dist LM to Ost LAD lesion is 30% stenosed.  1st Mrg lesion is 60% stenosed.  Prox LAD lesion is 90% stenosed.  Prox LAD to Mid LAD lesion is 60% stenosed.  Mid LAD lesion is 70% stenosed.  Prox RCA lesion is 99% stenosed.  Ost RCA to Prox RCA lesion is 90% stenosed.  Mid RCA lesion is 50% stenosed.  Dist RCA lesion is 20% stenosed.   Severe coronary calcification with multivessel CAD.  There is 30% distal narrowing of the left main coronary artery prior to its trifurcation into the LAD, ramus intermediate, and left circumflex vessel. The LAD has 90% very proximal (near ostial) stenosis followed by diffuse 60 and 70% proximal to mid calcified stenoses with diffuse 70% stenosis in the diagonal vessel; 50% very proximal ramus intermediate stenosis; 60% ostial OM1 stenosis and large dominant RCA with 90% proximal followed by 99% calcified proximal to mid stenosis with 50% mid stenosis and mild 20% areas of luminal irregularity.  LVEDP 16 mmHg.  RECOMMENDATION: Surgical consultation for CABG surgery.  The patient will be admitted to the hospital.  He will be hydrated post procedure, and started on heparin therapy.  Aggressive lipid-lowering therapy will be necessary with potential need for PCSK9 inhibition.     ASSESSMENT:    1. Coronary artery disease involving native coronary artery of native heart without angina pectoris   2. Hx of CABG   3. History of CEA (carotid endarterectomy)   4. PAF (paroxysmal atrial fibrillation) ([place])   5. Essential hypertension   6. Hyperlipidemia with target LDL less than 70     PLAN:  Mr. Lamari Beckles is a 65 year old gentleman who has a history of hypertension, significant hyperlipidemia as well as family history for heart disease.  The patient has had markedly elevated LDL levels in the past suggesting a high likelihood for familial hyperlipidemia.  A lipid panel in December 2020 has shown a total  cholesterol of 318 with an LDL cholesterol of 240.  The patient  had experienced an episode where he passed out being and subsequently has noticed some exertional chest pressure.  When I saw him for initial evaluation his ECG showed evidence for inferior Q waves with early transition suggesting the possibility that he may have had an inferior posterior MI in the past.  He had recently been found to have stage II hypertension and was started on valsartan 160 mg daily.  At his initial evaluation with me I reviewed new hypertensive guidelines and with his chest discomfort and initially low-dose metoprolol succinate at 25 mg daily.  He was found to have a markedly abnormal coronary CTA and subsequent cardiac catheterization demonstrated severe multivessel CAD.  Preoperative screening revealed critical left carotid stenosis and on further discussion it appeared that he had suffered a TIA several months prior to presentation.  He underwent successful CABG surgery x4 the following day by Dr. Kipp Brood and left carotid endarterectomy by Dr. Donnetta Hutching.  His postoperative course was complicated by atrial fibrillation for which he was started on amiodarone therapy.  He has subsequently been tapered off  amiodarone and has been maintaining sinus rhythm with metoprolol succinate 75 mg daily.  He completed phase 2 of the cardiac rehab.  Presently, he feels well.  He remains active and denies chest pain PND orthopnea.  He denies any breakthrough atrial fibrillation on his metoprolol succinate 75 mg.  He continues to be on Zetia and rosuvastatin 40 mg.  LDL cholesterol had improved in April 2021 to 62.  He has not had recent laboratory.  I have recommended a complete set of fasting labs.  He continues to be on aspirin.  He had undergone follow-up carotid imaging in December 2021 which remained stable with 40 to 59% right ICA stenoses and left carotid in the 1 to 39% range with a patent left carotid endarterectomy.  He will continue his  current medical regimen.  I will contact him regarding the results of his laboratory.  As long as he remains stable I will see him in 6 months for reevaluation or sooner as needed.     Medication Adjustments/Labs and Tests Ordered: Current medicines are reviewed at length with the patient today.  Concerns regarding medicines are outlined above.  Medication changes, Labs and Tests ordered today are listed in the Patient Instructions below. Patient Instructions  Medication Instructions:  Your physician recommends that you continue on your current medications as directed. Please refer to the Current Medication list given to you today.  *If you need a refill on your cardiac medications before your next appointment, please call your pharmacy*   Lab Work: CMET, CBC, Lipid, TSH  If you have labs (blood work) drawn today and your tests are completely normal, you will receive your results only by: Marland Kitchen MyChart Message (if you have MyChart) OR . A paper copy in the mail If you have any lab test that is abnormal or we need to change your treatment, we will call you to review the results.  Follow-Up: At [facility], you and your health needs are our priority.  As part of our continuing mission to provide you with exceptional heart care, we have created designated Provider Care Teams.  These Care Teams include your primary Cardiologist (physician) and Advanced Practice Providers (APPs -  Physician Assistants and Nurse Practitioners) who all work together to provide you with the care you need, when you need it.  We recommend signing up for the patient portal called "MyChart".  Sign up information is provided on this After Visit Summary.  MyChart is used to connect with patients for Virtual Visits (Telemedicine).  Patients are able to view lab/test results, encounter notes, upcoming appointments, etc.  Non-urgent messages can be sent to your provider as well.   To learn more about what you can do with  MyChart, go to [email or web address].    Your next appointment:   6 month(s)  The format for your next appointment:   In Person  Provider:   Shelva Majestic, MD     Signed, Shelva Majestic, MD  02/05/2021 1:29 PM    [facility] [address] [place], [place], [place]  [address] Phone: [phone]  He had discontinued this nature recommended with the spironolactone and that I would get that maybe about a week or 2 his digestive follow-up make sure the potassium does not allow

## 2021-02-04 NOTE — Patient Instructions (Signed)
Medication Instructions:  Your physician recommends that you continue on your current medications as directed. Please refer to the Current Medication list given to you today.  *If you need a refill on your cardiac medications before your next appointment, please call your pharmacy*   Lab Work: CMET, CBC, Lipid, TSH  If you have labs (blood work) drawn today and your tests are completely normal, you will receive your results only by: Marland Kitchen MyChart Message (if you have MyChart) OR . A paper copy in the mail If you have any lab test that is abnormal or we need to change your treatment, we will call you to review the results.  Follow-Up: At [facility], you and your health needs are our priority.  As part of our continuing mission to provide you with exceptional heart care, we have created designated Provider Care Teams.  These Care Teams include your primary Cardiologist (physician) and Advanced Practice Providers (APPs -  Physician Assistants and Nurse Practitioners) who all work together to provide you with the care you need, when you need it.  We recommend signing up for the patient portal called "MyChart".  Sign up information is provided on this After Visit Summary.  MyChart is used to connect with patients for Virtual Visits (Telemedicine).  Patients are able to view lab/test results, encounter notes, upcoming appointments, etc.  Non-urgent messages can be sent to your provider as well.   To learn more about what you can do with MyChart, go to [email or web address].    Your next appointment:   6 month(s)  The format for your next appointment:   In Person  Provider:   Shelva Majestic, MD

## 2021-02-05 ENCOUNTER — Encounter: Payer: Self-pay | Admitting: Cardiovascular Disease

## 2021-04-14 ENCOUNTER — Other Ambulatory Visit: Payer: Self-pay | Admitting: Cardiovascular Disease

## 2021-04-14 DIAGNOSIS — I10 Essential (primary) hypertension: Secondary | ICD-10-CM

## 2021-04-26 ENCOUNTER — Other Ambulatory Visit: Payer: Self-pay | Admitting: Cardiovascular Disease

## 2021-04-26 DIAGNOSIS — R0789 Other chest pain: Secondary | ICD-10-CM

## 2021-08-21 NOTE — Progress Notes (Signed)
Cardiology Office Note   Date:  08/23/2021   ID:  Dontravious, Donia 1956/06/24, MRN [id number]  PCP:  Antony Contras, MD  Cardiologist:  Shelva Majestic, MD EP: None  Chief Complaint  Patient presents with   Follow-up    CAD      History of Present Illness: Jesus Francis is a 65 y.o. male with a PMH of CAD s/p CABG 01/2020, HTN, HLD, Carotid artery disease s/p L CEA 01/2020, who presents for routine follow-up.  His cardiac history dates back to 2021 when he experienced exertional chest pain. He had a coronary CTA which revealed a calcium score of 2195 placing him in the 98th percentile for age/sex, and he was noted to have severe pRCA and mLAD lesions, and significant stenosis in the LCx. Echo showed EF 60-65%, no RWMA, and mild AI. He had a LHC 01/2020 which revealed severe multivessel CAD of the LAD and RCA, with moderate ramus lesions. He was recommended for admission and TCTS evaluate for possible CABG. His pre-CABG work-up revealed severe left ICA stenosis. He underwent left CEA and CABG with LIMA to LAD, SVG-PDA, SVG-OM1, and SVG-diagonal on 02/12/20. His post op course was complicated by atrial fibrillation with RVR with conversion to NSR with amiodarone. He was subsequently weaned off amiodarone given no recurrence outpatient. He had a repeat echocardiogram 05/2020 which showed EF 55-60%, no RWMA, and G1DD. He was doing well from a cardiac standpoint at his last visit with Dr. Claiborne Billings 01/2021.  He presents today for routine follow-up. Again reports he has been doing well from a cardiac standpoint.  He reports he feels like he is finally getting his stamina back after having COVID last December.  He is anticipating his 1 year anniversary after remarrying last October with plans to go to [employer] to celebrate.  He has no cardiac concerns today.  He is not limited in activity and is able to push mow his lawn.  No complaints of chest pain shortness of breath, dyspnea on exertion,  palpitations, lower extremity edema, orthopnea, PND, or syncope.  He reports blood pressures are typically in the 120s/60s at home, though always a little elevated in the office.   Past Medical History:  Diagnosis Date   Carotid artery occlusion    Coronary artery disease    Hyperlipidemia    Hypertension     Past Surgical History:  Procedure Laterality Date   CARDIAC CATHETERIZATION     CAROTID ENDARTERECTOMY     CORONARY ARTERY BYPASS GRAFT N/A 02/12/2020   Procedure: CORONARY ARTERY BYPASS GRAFTING (CABG) times four, using left internal mammary artery and right greater saphenous vein harvested endoscopically.;  Surgeon: Lajuana Matte, MD;  Location: [place];  Service: Open Heart Surgery;  Laterality: N/A;   ENDARTERECTOMY Left 02/12/2020   Procedure: ENDARTERECTOMY CAROTID;  Surgeon: Rosetta Posner, MD;  Location: [place];  Service: Vascular;  Laterality: Left;   LEFT HEART CATH AND CORONARY ANGIOGRAPHY N/A 02/11/2020   Procedure: LEFT HEART CATH AND CORONARY ANGIOGRAPHY;  Surgeon: Troy Sine, MD;  Location: [place] [facility];  Service: Cardiovascular;  Laterality: N/A;   TEE WITHOUT CARDIOVERSION N/A 02/12/2020   Procedure: TRANSESOPHAGEAL ECHOCARDIOGRAM (TEE);  Surgeon: Lajuana Matte, MD;  Location: [place];  Service: Open Heart Surgery;  Laterality: N/A;     Current Outpatient Medications  Medication Sig Dispense Refill   aspirin EC 81 MG tablet Take 81 mg by mouth at bedtime.  ezetimibe (ZETIA) 10 MG tablet TAKE ONE TABLET BY MOUTH DAILY 90 tablet 3   metoprolol succinate (TOPROL-XL) 50 MG 24 hr tablet TAKE 1 AND 1/2 TABLET BY MOUTH DAILY 135 tablet 3   nitroGLYCERIN (NITROSTAT) 0.4 MG SL tablet DISSOLVE 1 TAB UNDER TONGUE FOR CHEST PAIN - IF PAIN REMAINS AFTER 5 MIN, CALL 911 AND REPEAT DOSE. MAX 3 TABS IN 15 MINUTES 25 tablet 11   rosuvastatin (CRESTOR) 40 MG tablet Take 1 tablet (40 mg total) by mouth every evening. 90 tablet 3   No current  facility-administered medications for this visit.    Allergies:   Patient has no known allergies.    Social History:  The patient  reports that he has never smoked. He has never used smokeless tobacco. He reports current alcohol use of about 3.0 standard drinks per week. He reports that he does not use drugs.   Family History:  The patient's family history includes Heart attack in his brother, brother, and father; Heart disease in his brother, sister, and sister.    ROS:  Please see the history of present illness.   Otherwise, review of systems are positive for none.   All other systems are reviewed and negative.    PHYSICAL EXAM: VS:  BP (!) 130/100 (BP Location: Right Arm)   Pulse 68   Ht '5\' 10"'$  (1.778 m)   Wt 207 lb 9.6 oz (94.2 kg)   SpO2 96%   BMI 29.79 kg/m  , BMI Body mass index is 29.79 kg/m. GEN: Well nourished, well developed, in no acute distress HEENT: sclera anicteric Neck: no JVD, carotid bruits, or masses Cardiac: RRR; no murmurs, rubs, or gallops, no edema  Respiratory:  clear to auscultation bilaterally, normal work of breathing GI: soft, nontender, nondistended, + BS MS: no deformity or atrophy Skin: warm and dry, no rash Neuro:  Strength and sensation are intact Psych: euthymic mood, full affect   EKG:  EKG is ordered today. The ekg ordered today demonstrates sinus rhythm, rate 68 bpm, non-specific T wave abnormalities, no STE/D.    Recent Labs: 02/04/2021: ALT 27; BUN 16; Creatinine, Ser 0.99; Hemoglobin 15.6; Platelets 228; Potassium 5.3; Sodium 140; TSH 2.330    Lipid Panel    Component Value Date/Time   CHOL 134 02/04/2021 0949   TRIG 78 02/04/2021 0949   HDL 47 02/04/2021 0949   CHOLHDL 2.9 02/04/2021 0949   LDLCALC 72 02/04/2021 0949      Wt Readings from Last 3 Encounters:  08/23/21 207 lb 9.6 oz (94.2 kg)  02/04/21 202 lb 6.4 oz (91.8 kg)  12/04/20 206 lb 4.8 oz (93.6 kg)      Other studies Reviewed: Additional studies/ records  that were reviewed today include:   Left heart catheterization 02/11/20: Ramus lesion is 50% stenosed. Dist LM to Ost LAD lesion is 30% stenosed. 1st Mrg lesion is 60% stenosed. Prox LAD lesion is 90% stenosed. Prox LAD to Mid LAD lesion is 60% stenosed. Mid LAD lesion is 70% stenosed. Prox RCA lesion is 99% stenosed. Ost RCA to Prox RCA lesion is 90% stenosed. Mid RCA lesion is 50% stenosed. Dist RCA lesion is 20% stenosed.   Severe coronary calcification with multivessel CAD.  There is 30% distal narrowing of the left main coronary artery prior to its trifurcation into the LAD, ramus intermediate, and left circumflex vessel. The LAD has 90% very proximal (near ostial) stenosis followed by diffuse 60 and 70% proximal to mid calcified stenoses with diffuse  70% stenosis in the diagonal vessel; 50% very proximal ramus intermediate stenosis; 60% ostial OM1 stenosis and large dominant RCA with 90% proximal followed by 99% calcified proximal to mid stenosis with 50% mid stenosis and mild 20% areas of luminal irregularity.   CABG/ L CEA 02/12/20:    CABG X 4.  LIMA LAD, RSVG PDA, Ramus, and OM1 Endoscopic greater saphenous vein harvest on the right Intra-operative Transesophageal Echocardiogram (Left carotid endarterectomy and Dacron patch angioplasty - Please refer to Dr. Luther Parody note for further details)   Echocardiogram 01/08/20: 1. Left ventricular ejection fraction, by visual estimation, is 60 to  65%. The left ventricle has normal function. Left ventricular septal wall  thickness was mildly increased. Mildly increased left ventricular  posterior wall thickness. There is no left  ventricular hypertrophy.   2. The left ventricle has no regional wall motion abnormalities.   3. Global right ventricle has normal systolic function.The right  ventricular size is normal. No increase in right ventricular wall  thickness.   4. Left atrial size was normal.   5. Right atrial size was normal.   6.  Mild mitral annular calcification.   7. The mitral valve is normal in structure. Trivial mitral valve  regurgitation. No evidence of mitral stenosis.   8. The tricuspid valve is normal in structure.   9. The aortic valve is tricuspid. Aortic valve regurgitation is mild. No  evidence of aortic valve sclerosis or stenosis.  10. The pulmonic valve was normal in structure. Pulmonic valve  regurgitation is not visualized.  11. Normal pulmonary artery systolic pressure.  12. The inferior vena cava is normal in size with greater than 50%  respiratory variability, suggesting right atrial pressure of 3 mmHg.       Echocardiogram 05/2020: 1. Left ventricular ejection fraction, by estimation, is 55 to 60%. The  left ventricle has normal function. The left ventricle has no regional  wall motion abnormalities. Left ventricular diastolic parameters are  consistent with Grade I diastolic  dysfunction (impaired relaxation). There is abnormal (paradoxical) septal  motion, consistent with right ventricular volume overload.   2. Right ventricular systolic function is mildly reduced. The right  ventricular size is mildly enlarged. There is normal pulmonary artery  systolic pressure. The estimated right ventricular systolic pressure is  [id number] mmHg.   3. The mitral valve is grossly normal. Trivial mitral valve  regurgitation. No evidence of mitral stenosis.   4. The aortic valve is tricuspid. Aortic valve regurgitation is mild.  Mild to moderate aortic valve sclerosis/calcification is present, without  any evidence of aortic stenosis.   5. The inferior vena cava is normal in size with greater than 50%  respiratory variability, suggesting right atrial pressure of 3 mmHg.     ASSESSMENT AND PLAN:  1. CAD s/p CABG 02/12/20: no anginal complaints.  - Continue aspirin, statin, and zetia   2. HTN: BP 130/100 today.  Well-controlled in the 120s/60s at home - Continue metoprolol succinate   3. HLD: LDL 72   01/2021 on crestor '40mg'$  daily and zetia '10mg'$  daily - Will repeat a FLP 01/2022 - if not at goal LDL <70, will place referral to lipid clinic   4. Post-op atrial fibrillation: occurred following CABG. No recurrence. Weaned off amiodarone - Continue to monitor for recurrence   5. Carotid artery disease s/p left CEA:  Last dopplers 11/2020 showed 40 to 59% right ICA stenoses and left carotid in the 1 to 39% range with a patent left carotid  endarterectomy - Will update carotid doppler 11/2021 - Continue aspirin, statin, and zetia  Current medicines are reviewed at length with the patient today.  The patient does not have concerns regarding medicines.  The following changes have been made:  As above  Labs/ tests ordered today include:   Orders Placed This Encounter  Procedures   Comprehensive metabolic panel   Lipid panel   EKG 12-Lead   VAS US CAROTID     Disposition:   FU with Dr. Claiborne Billings in 6 months  Signed, Abigail Butts, PA-C  08/23/2021 8:41 AM

## 2021-08-23 ENCOUNTER — Ambulatory Visit (INDEPENDENT_AMBULATORY_CARE_PROVIDER_SITE_OTHER): Payer: No Typology Code available for payment source | Admitting: Medical

## 2021-08-23 ENCOUNTER — Encounter: Payer: Self-pay | Admitting: Medical

## 2021-08-23 ENCOUNTER — Other Ambulatory Visit: Payer: Self-pay

## 2021-08-23 VITALS — BP 130/100 | HR 68 | Ht 70.0 in | Wt 207.6 lb

## 2021-08-23 DIAGNOSIS — I10 Essential (primary) hypertension: Secondary | ICD-10-CM | POA: Diagnosis not present

## 2021-08-23 DIAGNOSIS — Z9889 Other specified postprocedural states: Secondary | ICD-10-CM

## 2021-08-23 DIAGNOSIS — I6523 Occlusion and stenosis of bilateral carotid arteries: Secondary | ICD-10-CM

## 2021-08-23 DIAGNOSIS — Z951 Presence of aortocoronary bypass graft: Secondary | ICD-10-CM | POA: Diagnosis not present

## 2021-08-23 DIAGNOSIS — I2510 Atherosclerotic heart disease of native coronary artery without angina pectoris: Secondary | ICD-10-CM

## 2021-08-23 DIAGNOSIS — E785 Hyperlipidemia, unspecified: Secondary | ICD-10-CM | POA: Diagnosis not present

## 2021-08-23 NOTE — Patient Instructions (Signed)
Medication Instructions:  No Changes *If you need a refill on your cardiac medications before your next appointment, please call your pharmacy*   Lab Work: CMET, Lipid (To Be Done Prior to Visit With Dr. Claiborne Billings) If you have labs (blood work) drawn today and your tests are completely normal, you will receive your results only by: [place] (if you have MyChart) OR A paper copy in the mail If you have any lab test that is abnormal or we need to change your treatment, we will call you to review the results.   Testing/Procedures: [place], [address]. ( To Be Done In December) Your physician has requested that you have a carotid duplex. This test is an ultrasound of the carotid arteries in your neck. It looks at blood flow through these arteries that supply the brain with blood. Allow one hour for this exam. There are no restrictions or special instructions.    Follow-Up: At [facility], you and your health needs are our priority.  As part of our continuing mission to provide you with exceptional heart care, we have created designated Provider Care Teams.  These Care Teams include your primary Cardiologist (physician) and Advanced Practice Providers (APPs -  Physician Assistants and Nurse Practitioners) who all work together to provide you with the care you need, when you need it.   Your next appointment:   6 month(s)  The format for your next appointment:   In Person  Provider:   Shelva Majestic, MD

## 2021-10-11 ENCOUNTER — Other Ambulatory Visit: Payer: Self-pay | Admitting: Cardiovascular Disease

## 2021-10-11 DIAGNOSIS — R9431 Abnormal electrocardiogram [ECG] [EKG]: Secondary | ICD-10-CM

## 2021-10-11 DIAGNOSIS — I10 Essential (primary) hypertension: Secondary | ICD-10-CM

## 2021-10-11 DIAGNOSIS — I209 Angina pectoris, unspecified: Secondary | ICD-10-CM

## 2021-11-02 ENCOUNTER — Ambulatory Visit: Payer: No Typology Code available for payment source | Admitting: Cardiovascular Disease

## 2021-11-09 ENCOUNTER — Other Ambulatory Visit: Payer: Self-pay

## 2021-11-09 DIAGNOSIS — I6523 Occlusion and stenosis of bilateral carotid arteries: Secondary | ICD-10-CM

## 2021-12-06 ENCOUNTER — Ambulatory Visit (HOSPITAL_COMMUNITY)
Admission: RE | Admit: 2021-12-06 | Discharge: 2021-12-06 | Disposition: A | Discharge status: Home / Self Care | Payer: No Typology Code available for payment source | Source: Ambulatory Visit | Attending: Physician Assistant | Admitting: Physician Assistant

## 2021-12-06 ENCOUNTER — Other Ambulatory Visit: Payer: Self-pay

## 2021-12-06 ENCOUNTER — Ambulatory Visit (INDEPENDENT_AMBULATORY_CARE_PROVIDER_SITE_OTHER): Payer: No Typology Code available for payment source | Admitting: Physician Assistant

## 2021-12-06 VITALS — BP 153/83 | HR 70 | Temp 97.6°F | Resp 20 | Ht 70.0 in | Wt 213.4 lb

## 2021-12-06 DIAGNOSIS — E782 Mixed hyperlipidemia: Secondary | ICD-10-CM | POA: Insufficient documentation

## 2021-12-06 DIAGNOSIS — I6523 Occlusion and stenosis of bilateral carotid arteries: Secondary | ICD-10-CM

## 2021-12-06 DIAGNOSIS — J309 Allergic rhinitis, unspecified: Secondary | ICD-10-CM | POA: Insufficient documentation

## 2021-12-06 DIAGNOSIS — F4320 Adjustment disorder, unspecified: Secondary | ICD-10-CM | POA: Insufficient documentation

## 2021-12-06 DIAGNOSIS — E119 Type 2 diabetes mellitus without complications: Secondary | ICD-10-CM | POA: Insufficient documentation

## 2021-12-06 DIAGNOSIS — I10 Essential (primary) hypertension: Secondary | ICD-10-CM | POA: Insufficient documentation

## 2021-12-06 NOTE — Progress Notes (Signed)
HISTORY AND PHYSICAL     CC:  follow up. Requesting Provider:  Antony Contras, MD  HPI: This is a 65 y.o. male here for follow up for carotid artery stenosis.   Discussion with Dr. Donnetta Hutching and pt, pt had a very clear cut left brain TIA several months prior to presentation Pt is s/p left CEA  by Dr. Donnetta Hutching with CABG x 4 by Dr. Kipp Brood on 02/12/2020.   His post op course was complicated by afib.    Pt was last seen 12/04/2021 and at that time he was doing well without stroke symptoms.   Pt returns today for follow up.    Pt denies any amaurosis fugax, speech difficulties, weakness, numbness, paralysis or clumsiness or facial droop.    He and his wife celebrated their one year anniversary at the [employer].    The pt is on a statin for cholesterol management.  The pt is on a daily aspirin.   Other AC:  none The pt is on BB for hypertension.   The pt is not diabetic.   Tobacco hx:  never    Past Medical History:  Diagnosis Date   Carotid artery occlusion    Coronary artery disease    Hyperlipidemia    Hypertension     Past Surgical History:  Procedure Laterality Date   CARDIAC CATHETERIZATION     CAROTID ENDARTERECTOMY     CORONARY ARTERY BYPASS GRAFT N/A 02/12/2020   Procedure: CORONARY ARTERY BYPASS GRAFTING (CABG) times four, using left internal mammary artery and right greater saphenous vein harvested endoscopically.;  Surgeon: Lajuana Matte, MD;  Location: [place];  Service: Open Heart Surgery;  Laterality: N/A;   ENDARTERECTOMY Left 02/12/2020   Procedure: ENDARTERECTOMY CAROTID;  Surgeon: Rosetta Posner, MD;  Location: [place];  Service: Vascular;  Laterality: Left;   LEFT HEART CATH AND CORONARY ANGIOGRAPHY N/A 02/11/2020   Procedure: LEFT HEART CATH AND CORONARY ANGIOGRAPHY;  Surgeon: Troy Sine, MD;  Location: [place] [facility];  Service: Cardiovascular;  Laterality: N/A;   TEE WITHOUT CARDIOVERSION N/A 02/12/2020   Procedure: TRANSESOPHAGEAL ECHOCARDIOGRAM  (TEE);  Surgeon: Lajuana Matte, MD;  Location: [place];  Service: Open Heart Surgery;  Laterality: N/A;    No Known Allergies  Current Outpatient Medications  Medication Sig Dispense Refill   aspirin EC 81 MG tablet Take 81 mg by mouth at bedtime.      ezetimibe (ZETIA) 10 MG tablet TAKE ONE TABLET BY MOUTH DAILY 90 tablet 3   metoprolol succinate (TOPROL-XL) 50 MG 24 hr tablet TAKE 1 AND 1/2 TABLET BY MOUTH DAILY 135 tablet 3   nitroGLYCERIN (NITROSTAT) 0.4 MG SL tablet DISSOLVE 1 TAB UNDER TONGUE FOR CHEST PAIN - IF PAIN REMAINS AFTER 5 MIN, CALL 911 AND REPEAT DOSE. MAX 3 TABS IN 15 MINUTES 25 tablet 11   rosuvastatin (CRESTOR) 40 MG tablet TAKE ONE TABLET BY MOUTH EVERY EVENING 90 tablet 3   No current facility-administered medications for this visit.    Family History  Problem Relation Age of Onset   Heart attack Father    Heart disease Sister    Heart disease Brother    Heart attack Brother    Heart attack Brother    Heart disease Sister     Social History   Socioeconomic History   Marital status: Widowed    Spouse name: Not on file   Number of children: 2   Years of education: 16   Highest  education level: Bachelor's degree (e.g., BA, AB, BS)  Occupational History   Not on file  Tobacco Use   Smoking status: Never   Smokeless tobacco: Never  Vaping Use   Vaping Use: Never used  Substance and Sexual Activity   Alcohol use: Yes    Alcohol/week: 3.0 standard drinks    Types: 1 Glasses of wine, 1 Cans of beer, 1 Shots of liquor per week   Drug use: Never   Sexual activity: Not on file  Other Topics Concern   Not on file  Social History Narrative   Not on file   Social Determinants of Health   Financial Resource Strain: Not on file  Food Insecurity: Not on file  Transportation Needs: Not on file  Physical Activity: Not on file  Stress: Not on file  Social Connections: Not on file  Intimate Partner Violence: Not on file     REVIEW OF SYSTEMS:    [X]  denotes positive finding, [ ]  denotes negative finding Cardiac  Comments:  Chest pain or chest pressure:    Shortness of breath upon exertion:    Short of breath when lying flat:    Irregular heart rhythm:        Vascular    Pain in calf, thigh, or hip brought on by ambulation:    Pain in feet at night that wakes you up from your sleep:     Blood clot in your veins:    Leg swelling:         Pulmonary    Oxygen at home:    Productive cough:     Wheezing:         Neurologic    Sudden weakness in arms or legs:     Sudden numbness in arms or legs:     Sudden onset of difficulty speaking or slurred speech:    Temporary loss of vision in one eye:     Problems with dizziness:         Gastrointestinal    Blood in stool:     Vomited blood:         Genitourinary    Burning when urinating:     Blood in urine:        Psychiatric    Major depression:         Hematologic    Bleeding problems:    Problems with blood clotting too easily:        Skin    Rashes or ulcers:        Constitutional    Fever or chills:      PHYSICAL EXAMINATION:  Today's Vitals   12/06/21 0833 12/06/21 0835  BP: (!) 162/84 (!) 153/83  Pulse: 70   Resp: 20   Temp: 97.6 F (36.4 C)   TempSrc: Temporal   SpO2: 97%   Weight: 213 lb 6.4 oz (96.8 kg)   Height: 5\' 10"  (1.778 m)    Body mass index is 30.62 kg/m.   General:  WDWN in NAD; vital signs documented above Gait: Not observed HENT: WNL, normocephalic Pulmonary: normal non-labored breathing Cardiac: regular HR, without carotid bruits Abdomen: soft, NT; aortic pulse is not palpable Skin: without rashes Vascular Exam/Pulses:  Right Left  Radial 2+ (normal) 2+ (normal)  Popliteal Unable to palpate Unable to palpate   Extremities: without ischemic changes, without Gangrene , without cellulitis; without open wounds Musculoskeletal: no muscle wasting or atrophy  Neurologic: A&O X 3; moving all extremities equally; speech is  fluent/normal Psychiatric:  The pt has Normal affect.   Non-Invasive Vascular Imaging:   Carotid Duplex on 12/06/2021: Right:  40-59% ICA stenosis Left:  1-39% ICA stenosis   Previous Carotid duplex on 12/04/2020: Right: 40-59% ICA stenosis Left:   1-39% ICA stenosis    ASSESSMENT/PLAN:: 65 y.o. male here for follow up carotid artery stenosis and is s/p left CEA  by Dr. Donnetta Hutching with CABG x 4 by Dr. Kipp Brood on 02/12/2020.    -duplex today reveals his carotid stenosis is essentially unchanged bilaterally.  He remains asymptomatic.   -discussed s/s of stroke with pt and he understands should he develop any of these sx, he will go to the nearest ER or call 911. -pt will f/u in one year with carotid duplex -pt will call sooner should they have any issues. -continue statin/asa   Leontine Locket, [facility] [phone]  Clinic MD:  Trula Slade

## 2022-03-03 ENCOUNTER — Other Ambulatory Visit: Payer: Self-pay

## 2022-03-03 ENCOUNTER — Ambulatory Visit (INDEPENDENT_AMBULATORY_CARE_PROVIDER_SITE_OTHER): Payer: No Typology Code available for payment source | Admitting: Cardiovascular Disease

## 2022-03-03 ENCOUNTER — Encounter: Payer: Self-pay | Admitting: Cardiovascular Disease

## 2022-03-03 DIAGNOSIS — E785 Hyperlipidemia, unspecified: Secondary | ICD-10-CM

## 2022-03-03 DIAGNOSIS — I10 Essential (primary) hypertension: Secondary | ICD-10-CM | POA: Diagnosis not present

## 2022-03-03 DIAGNOSIS — Z951 Presence of aortocoronary bypass graft: Secondary | ICD-10-CM

## 2022-03-03 DIAGNOSIS — Z9889 Other specified postprocedural states: Secondary | ICD-10-CM

## 2022-03-03 DIAGNOSIS — Z5181 Encounter for therapeutic drug level monitoring: Secondary | ICD-10-CM

## 2022-03-03 DIAGNOSIS — I2510 Atherosclerotic heart disease of native coronary artery without angina pectoris: Secondary | ICD-10-CM

## 2022-03-03 NOTE — Progress Notes (Signed)
Cardiology Office Note    Date:  03/03/2022   ID:  DRAY DENTE, DOB 04/16/1956, MRN [id number]  PCP: Dr. Marijo File Cardiologist:  Shelva Majestic, MD   13 month F/U cardiology evaluation initially sent through the courtesy of Dr. Marijo File for evaluation of exertional chest discomfort.  History of Present Illness:  Jesus Francis is a 67 y.o. male who was initially referred for through the courtesy of Dr. Marijo File for exertional chest pressure.  He presents for a 105-month follow-up evaluation.    Jesus Francis has a history of hypertension as well as hyperlipidemia.  In November 2018, LDL cholesterol was 264 and apparently was against statin therapy.  He states that over 10 years ago he did experience some chest discomfort and apparently had a stress test.  He was told that this was normal but never saw a cardiologist.  The patient has issues with bone spurs involving his cervical and thoracic spine.  He states that around Thanksgiving he became upset and apparently passed out for short duration.  He did note chest pressure.  Subsequently, he has noted some exertional chest pressure with activity.  Remotely he used to work out hard but admits that over the past 2 years he really has not exercised to any capacity.  He does admit to being under increased stress and that his wife died 98 years ago at age 72.  His youngest daughter also had brain cancer.  He was recently evaluated by Dr. Marijo File on December 25, 2019.  His ECG raise concern for prior heart attack.  There was no ECG to compare to laboratory showed a total cholesterol of 318, HDL 45, LDL 240, triglycerides 170.  Hemoglobin A1c was 6.1.  Creatinine 1.02.  LFTs were normal.  During his evaluation, his blood pressure was elevated at 172/102 and he was started on valsartan 160 mg he also was started on rosuvastatin 40 mg in light of his significant hyperlipidemia.    When I saw him for initial evaluation on January 02, 2020,  with his exertional change symptomatology I recommended initiation of metoprolol succinate 25 mg which would also assist in his blood pressure control.  I recommended an echo Doppler study as well as coronary CTA evaluation with possible FFR.  If LDL continues to be elevated on maximal therapy I discussed potential future PCSK9 inhibition.  His echo Doppler study on January 08, 2020 demonstrated normal systolic function with EF 60 to 65%.  There was mildly increased left ventricular posterior wall thickness without LVH.  Coronary CTA was significant abnormal with a coronary calcium score 2195, 98th percentile for age and sex matched control.  He was found to have severe calcified plaque in the proximal RCA and proximal to mid LAD.  Subsequent FFR analysis suggested hemodynamic significance with mid LAD FFR less than 0.5 and circumflex FFR 0.77.  The proximal RCA FFR was 0.74.  He was felt after occlusion of the mid RCA.  Jesus Francis has felt improved with the addition of Toprol added to his valsartan.  I saw him as an add-on on February 04 2019 in follow-up of his severely abnormal coronary CTA.    He underwent cardiac catheterization on February 11 2020 which revealed severe coronary calcification with multivessel CAD.  There was 30% distal left main narrowing prior to its trifurcation into the LAD, ramus and circumflex vessel.  The LAD had 95% near ostial stenosis followed by diffuse 60 to 70% proximal to mid  calcified stenoses with diffuse 70% stenosis of the diagonal vessel.  Please intermediate had 50% proximal stenosis the circumflex 60% ostial OM1 stenosis in the large dominant RCA had severe 90 to 99% calcified proximal to mid stenoses.  Preoperative screening revealed high-grade left carotid stenosis.  He underwent successful CABG revascularization surgery the following day and had a LIMA placed to his LAD, SVG to PDA, I last saw him SVG to OM1, and SVG to diagonal and left carotid endarterectomy by  Dr. Donnetta Hutching.  He developed postoperative atrial fibrillation and was treated with IV amiodarone with conversion to sinus rhythm.    He subsequent was seen in our office by [name], PA-C on February 27 2020.  I saw him for initial post hospital evaluation with me on Apr 29, 2020.  At that time he was participating in cardiac rehab phase 2.  He had not had any recurrent atrial fibrillation.  At cardiac rehab his blood pressure was stable typically running in the 110 -530 range systolically.  He did not have any recurrent atrial fibrillation.  During that evaluation I recommended reduction of amiodarone to 100 mg for 2 weeks and then discontinuance and increased Toprol  succinate to 75 mg daily.  He underwent a follow-up echo Doppler study on June 09, 2020 which showed an EF of 55 to 60% without wall motion abnormalities.  There was grade 1 diastolic dysfunction.  I  saw him in August 2021.  He completed the cardiac rehab program.  He states his blood pressure at home typically runs in the 110-120 range and this was consistent at cardiac rehab.  However he does have "white coat "shoes and blood pressure always is elevated in a doctor's office.  Last week, he was bitten by a cat and was treated with Augmentin.  He denied chest pain PND orthopnea.  He denied any awareness of heart rate irregularity.  He felt the best he has felt in years.    I last saw him on February 04, 2021.  He was married on September 26, 2020.  He continued to remain stable and specifically denied any recurrent anginal symptomatology or exertional dyspnea.  He continues to be on metoprolol succinate 75 mg daily in addition to Zetia and rosuvastatin 40 mg and aspirin.   He is status post left carotid endarterectomy and had undergone follow-up carotid imaging in December 2021 which remained stable with 40 to 59% right ICA stenosis and left carotid at 1 to 39%.  Since I last saw him, he over the past year he has remained stable.  He continues  to work.  He states his blood pressure at home typically runs around 051 systolically.  However it is always elevated when he comes to the doctor's office.  He remains active.  He denies chest pain or shortness of breath.  He recently underwent a 1 year follow-up carotid Doppler evaluation and this was essentially unchanged from his 2021 assessment.  He has not had recent laboratory.  He presents for 2-month follow-up evaluation.   Past medical history is notable for hypertension, hyperlipidemia, history of kidney stones, history of internal hemorrhoids, remote history of hematuria with negative work-up by urology, history of carpal tunnel syndrome status post bilateral release, previous documentation of aortoiliac atherosclerosis seen on CT imaging in 2019.  Past surgical history is notable for his carpal tunnel release, hemorrhoidal surgery, as well as vocal cord surgery.  Current Medications: Outpatient Medications Prior to Visit  Medication Sig Dispense Refill  aspirin EC 81 MG tablet Take 81 mg by mouth at bedtime.      ezetimibe (ZETIA) 10 MG tablet TAKE ONE TABLET BY MOUTH DAILY 90 tablet 3   metoprolol succinate (TOPROL-XL) 50 MG 24 hr tablet TAKE 1 AND 1/2 TABLET BY MOUTH DAILY 135 tablet 3   nitroGLYCERIN (NITROSTAT) 0.4 MG SL tablet DISSOLVE 1 TAB UNDER TONGUE FOR CHEST PAIN - IF PAIN REMAINS AFTER 5 MIN, CALL 911 AND REPEAT DOSE. MAX 3 TABS IN 15 MINUTES 25 tablet 11   rosuvastatin (CRESTOR) 40 MG tablet TAKE ONE TABLET BY MOUTH EVERY EVENING 90 tablet 3   No facility-administered medications prior to visit.     Allergies:   Patient has no known allergies.   Social History   Socioeconomic History   Marital status: Widowed    Spouse name: Not on file   Number of children: 2   Years of education: 16   Highest education level: Bachelor's degree (e.g., BA, AB, BS)  Occupational History   Not on file  Tobacco Use   Smoking status: Never   Smokeless tobacco: Never  Vaping  Use   Vaping Use: Never used  Substance and Sexual Activity   Alcohol use: Yes    Alcohol/week: 3.0 standard drinks    Types: 1 Glasses of wine, 1 Cans of beer, 1 Shots of liquor per week   Drug use: Never   Sexual activity: Not on file  Other Topics Concern   Not on file  Social History Narrative   Not on file   Social Determinants of Health   Financial Resource Strain: Not on file  Food Insecurity: Not on file  Transportation Needs: Not on file  Physical Activity: Not on file  Stress: Not on file  Social Connections: Not on file     Social history is notable in that he was widowed for 5 years when his wife died at age 10.  He has 2 children ages 48 and 20 and his youngest daughter had a previous brain tumor.  He graduated from [employer].  He works for [employer] in [employer].  He does not smoke cigarettes.  There is a rare cigar use.  He drinks an occasional beer wine scotch or bourbon.  He was remarried on September 26, 2020  Family History:  The patient's family history includes Heart attack in his brother, brother, and father; Heart disease in his brother, sister, and sister.   His mother died at age 44.  His father died at age 48 but had a heart attack at age 78.  There are 6 siblings.  Brother age 81 who is alive and well, brother age 76 has a pacemaker and defibrillator, a brother died at age 32 with a heart attack and another brother died at age 56 with a heart attack.  One sister age 52 had a stent at age 87 and another sister at age 42 had bypass surgery at age 54.  ROS General: Negative; No fevers, chills, or night sweats;  HEENT: Negative; No changes in vision or hearing, sinus congestion, difficulty swallowing Pulmonary: Negative; No cough, wheezing, shortness of breath, hemoptysis Cardiovascular: See HPI GI: Negative; No nausea, vomiting, diarrhea, or abdominal pain GU: Negative; No dysuria, hematuria, or difficulty voiding Musculoskeletal: Positive for left radicular  pain Hematologic/Oncology: Negative; no easy bruising, bleeding Endocrine: Negative; no heat/cold intolerance; no diabetes Neuro: Negative; no changes in balance, headaches Skin: Negative; No rashes or skin lesions Psychiatric: Negative; No behavioral  problems, depression Sleep: Negative; No snoring, daytime sleepiness, hypersomnolence, bruxism, restless legs, hypnogognic hallucinations, no cataplexy Other comprehensive 14 point system review is negative.   PHYSICAL EXAM:   VS:  BP (!) 150/93    Pulse 68    Ht $R'5\' 10"'Sy$  (1.778 m)    Wt 211 lb 6.4 oz (95.9 kg)    SpO2 96%    BMI 30.33 kg/m     Repeat blood pressure by me was 140/78.  He states his blood pressure at home typically is in the 262M systolically  Wt Readings from Last 3 Encounters:  03/03/22 211 lb 6.4 oz (95.9 kg)  12/06/21 213 lb 6.4 oz (96.8 kg)  08/23/21 207 lb 9.6 oz (94.2 kg)    General: Alert, oriented, no distress.  Skin: normal turgor, no rashes, warm and dry HEENT: Normocephalic, atraumatic. Pupils equal round and reactive to light; sclera anicteric; extraocular muscles intact; Fundi ** Nose without nasal septal hypertrophy Mouth/Parynx benign; Mallinpatti scale 3 Neck: No JVD, no carotid bruits; normal carotid upstroke Lungs: clear to ausculatation and percussion; no wheezing or rales Chest wall: without tenderness to palpitation Heart: PMI not displaced, RRR, s1 s2 normal, 1/6 systolic murmur, no diastolic murmur, no rubs, gallops, thrills, or heaves Abdomen: soft, nontender; no hepatosplenomehaly, BS+; abdominal aorta nontender and not dilated by palpation. Back: no CVA tenderness Pulses 2+ Musculoskeletal: full range of motion, normal strength, no joint deformities Extremities: no clubbing cyanosis or edema, Homan's sign negative  Neurologic: grossly nonfocal; Cranial nerves grossly wnl Psychologic: Normal mood and affect   Studies/Labs Reviewed:   March 03, 2022 ECG (independently read by me): Sinus  rhythm at 71; No ectopy, normal intervals  February 04, 2021 ECG (independently read by me):  NSR at 77, Small Q wave inferiorly, no ectopy, normal intervals  August 04, 2020 ECG (independently read by me): Normal sinus rhythm at 68 bpm.  Nondiagnostic T wave abnormality.  Normal intervals.  No ectopy.  Apr 29, 2020 ECG (independently read by me): Normal sinus rhythm at 68 bpm.  Small inferior Q waves and early transition.  February 05, 2020 ECG (independently read by me): Normal sinus rhythm at 85 bpm with mild sinus arrhythmia, small inferior Q waves with possible inferior infarct.  QTc interval 428 ms..  Number 150 ms  January 02, 2020 ECG (independently read by me): Normal sinus rhythm at 81 bpm.  Small inferior Q waves in leads III and aVF suggestive of  age-indeterminate inferior infarct.  Early transition.  Normal intervals.  No ectopy  Recent Labs: BMP Latest Ref Rng & Units 02/04/2021 02/16/2020 02/15/2020  Glucose 65 - 99 mg/dL 128(H) 112(H) 149(H)  BUN 8 - 27 mg/dL $Remove'16 16 16  'TciSKKJ$ Creatinine 0.76 - 1.27 mg/dL 0.99 1.07 0.96  BUN/Creat Ratio 10 - 24 16 - -  Sodium 134 - 144 mmol/L 140 139 135  Potassium 3.5 - 5.2 mmol/L 5.3(H) 3.9 4.0  Chloride 96 - 106 mmol/L 102 101 99  CO2 20 - 29 mmol/L $RemoveB'24 28 26  'XFIXDfma$ Calcium 8.6 - 10.2 mg/dL 9.6 9.2 8.9     Hepatic Function Latest Ref Rng & Units 02/04/2021 02/11/2020 01/28/2020  Total Protein 6.0 - 8.5 g/dL 7.4 6.6 7.1  Albumin 3.8 - 4.8 g/dL 4.4 3.3(L) 4.3  AST 0 - 40 IU/L $Remov'27 22 21  'BpdCqT$ ALT 0 - 44 IU/L $Remov'27 26 22  'msWZiR$ Alk Phosphatase 44 - 121 IU/L 67 43 56  Total Bilirubin 0.0 - 1.2 mg/dL 0.5 0.8 0.3  CBC Latest Ref Rng & Units 02/04/2021 02/16/2020 02/15/2020  WBC 3.4 - 10.8 x10E3/uL 10.8 14.7(H) 17.7(H)  Hemoglobin 13.0 - 17.7 g/dL 15.6 10.3(L) 9.7(L)  Hematocrit 37.5 - 51.0 % 46.4 31.0(L) 29.7(L)  Platelets 150 - 450 x10E3/uL 228 185 160   Lab Results  Component Value Date   MCV 92 02/04/2021   MCV 93.7 02/16/2020   MCV 93.7 02/15/2020   Lab  Results  Component Value Date   TSH 2.330 02/04/2021   Lab Results  Component Value Date   HGBA1C 6.4 (H) 02/11/2020     BNP No results found for: BNP  ProBNP No results found for: PROBNP   Lipid Panel     Component Value Date/Time   CHOL 134 02/04/2021 0949   TRIG 78 02/04/2021 0949   HDL 47 02/04/2021 0949   CHOLHDL 2.9 02/04/2021 0949   LDLCALC 72 02/04/2021 0949   LABVLDL 15 02/04/2021 0949     RADIOLOGY: No results found.   Additional studies/ records that were reviewed today include:  I reviewed the records from Dr. Marijo File  Laboratory from December 25, 2019: Total cholesterol 318, HDL 45, LDL 240, triglycerides 170 HDL 6.1, consistent with prediabetes Creatinine 1.02.  Echo Doppler study, coronary CTA and FFR analysis were reviewed and thoroughly discussed with the patient.  February 11, 2020 Ramus lesion is 50% stenosed. Dist LM to Ost LAD lesion is 30% stenosed. 1st Mrg lesion is 60% stenosed. Prox LAD lesion is 90% stenosed. Prox LAD to Mid LAD lesion is 60% stenosed. Mid LAD lesion is 70% stenosed. Prox RCA lesion is 99% stenosed. Ost RCA to Prox RCA lesion is 90% stenosed. Mid RCA lesion is 50% stenosed. Dist RCA lesion is 20% stenosed.   Severe coronary calcification with multivessel CAD.  There is 30% distal narrowing of the left main coronary artery prior to its trifurcation into the LAD, ramus intermediate, and left circumflex vessel. The LAD has 90% very proximal (near ostial) stenosis followed by diffuse 60 and 70% proximal to mid calcified stenoses with diffuse 70% stenosis in the diagonal vessel; 50% very proximal ramus intermediate stenosis; 60% ostial OM1 stenosis and large dominant RCA with 90% proximal followed by 99% calcified proximal to mid stenosis with 50% mid stenosis and mild 20% areas of luminal irregularity.   LVEDP 16 mmHg.   RECOMMENDATION: Surgical consultation for CABG surgery.  The patient will be admitted to the  hospital.  He will be hydrated post procedure, and started on heparin therapy.  Aggressive lipid-lowering therapy will be necessary with potential need for PCSK9 inhibition.      ASSESSMENT:    1. Coronary artery disease involving native coronary artery of native heart without angina pectoris   2. Hx of CABG   3. Essential hypertension   4. Hyperlipidemia with target LDL less than 70   5. History of CEA (carotid endarterectomy)   6. Therapeutic drug monitoring     PLAN:  Jesus Francis is a 66 year old gentleman who has a history of hypertension, significant hyperlipidemia as well as family history for heart disease.  The patient has had markedly elevated LDL levels in the past suggesting a high likelihood for familial hyperlipidemia.  A lipid panel in December 2020 has shown a total cholesterol of 318 with an LDL cholesterol of 240.  The patient  had experienced an episode where he passed out being and subsequently has noticed some exertional chest pressure.  When I saw him for initial evaluation his ECG  showed evidence for inferior Q waves with early transition suggesting the possibility that he may have had an inferior posterior MI in the past.  He developed stage II hypertension and was started on valsartan 160 mg daily.  At his initial evaluation with me I reviewed new hypertensive guidelines and with his chest discomfort and initially low-dose metoprolol succinate at 25 mg daily.  He was found to have a markedly abnormal coronary CTA and subsequent cardiac catheterization demonstrated severe multivessel CAD.  Preoperative screening revealed critical left carotid stenosis and on further discussion it appeared that he had suffered a TIA several months prior to presentation.  He underwent successful CABG surgery x4 the following day by Dr. Kipp Brood and left carotid endarterectomy by Dr. Donnetta Hutching.  His postoperative course was complicated by atrial fibrillation for which he was started on  amiodarone therapy.  He successfully completed cardiac rehab.  Presently, he continues to be stable and is without recurrent anginal symptomatology.  His blood pressure at home typically is in the normal range but is elevated slightly today.  On repeat by me blood pressure was 140/78.  I reviewed his most recent carotid duplex evaluation from December 06, 2021 which is unchanged from the year previously.  He continues to be on Zetia 10 mg and rosuvastatin 40 mg for hyperlipidemia.  He is on metoprolol succinate 75 mg daily.  If blood pressure continues to be elevated additive therapy may be necessary.  I have recommended a complete set of follow-up laboratory to be done in the fasting state which will include a comprehensive metabolic panel, CBC, TSH, lipid panel, and I will also obtain an LP(a).  I will contact him with the results of his studies and adjustments will be made if necessary.  He sees Dr. Marijo File for primary care.  I will see him in 1 year for cardiology reevaluation or sooner as needed.   Medication Adjustments/Labs and Tests Ordered: Current medicines are reviewed at length with the patient today.  Concerns regarding medicines are outlined above.  Medication changes, Labs and Tests ordered today are listed in the Patient Instructions below. Patient Instructions  Medication Instructions:  Your physician recommends that you continue on your current medications as directed. Please refer to the Current Medication list given to you today.   *If you need a refill on your cardiac medications before your next appointment, please call your pharmacy*   Lab Work: FASTING LP/CMET/TSH/CBC/LPa SOON   If you have labs (blood work) drawn today and your tests are completely normal, you will receive your results only by: [place] (if you have MyChart) OR A paper copy in the mail If you have any lab test that is abnormal or we need to change your treatment, we will call you to review the  results.  Testing/Procedures: NONE   Follow-Up: At [facility], you and your health needs are our priority.  As part of our continuing mission to provide you with exceptional heart care, we have created designated Provider Care Teams.  These Care Teams include your primary Cardiologist (physician) and Advanced Practice Providers (APPs -  Physician Assistants and Nurse Practitioners) who all work together to provide you with the care you need, when you need it.  We recommend signing up for the patient portal called "MyChart".  Sign up information is provided on this After Visit Summary.  MyChart is used to connect with patients for Virtual Visits (Telemedicine).  Patients are able to view lab/test results, encounter notes, upcoming  appointments, etc.  Non-urgent messages can be sent to your provider as well.   To learn more about what you can do with MyChart, go to [email or web address].    Your next appointment:   12 month(s)  The format for your next appointment:   In Person  Provider:   Shelva Majestic, MD {    Signed, Shelva Majestic, MD  03/03/2022 5:17 PM    [facility] [address] [place], [place], [place]  [address] Phone: [phone]  He had discontinued this nature recommended with the spironolactone and that I would get that maybe about a week or 2 his digestive follow-up make sure the potassium does not allow

## 2022-03-03 NOTE — Patient Instructions (Signed)
Medication Instructions:  ?Your physician recommends that you continue on your current medications as directed. Please refer to the Current Medication list given to you today.  ? ?*If you need a refill on your cardiac medications before your next appointment, please call your pharmacy* ? ? ?Lab Work: ?FASTING LP/CMET/TSH/CBC/LPa SOON  ? ?If you have labs (blood work) drawn today and your tests are completely normal, you will receive your results only by: ?MyChart Message (if you have MyChart) OR ?A paper copy in the mail ?If you have any lab test that is abnormal or we need to change your treatment, we will call you to review the results. ? ?Testing/Procedures: ?NONE  ? ?Follow-Up: ?At [facility], you and your health needs are our priority.  As part of our continuing mission to provide you with exceptional heart care, we have created designated Provider Care Teams.  These Care Teams include your primary Cardiologist (physician) and Advanced Practice Providers (APPs -  Physician Assistants and Nurse Practitioners) who all work together to provide you with the care you need, when you need it. ? ?We recommend signing up for the patient portal called "MyChart".  Sign up information is provided on this After Visit Summary.  MyChart is used to connect with patients for Virtual Visits (Telemedicine).  Patients are able to view lab/test results, encounter notes, upcoming appointments, etc.  Non-urgent messages can be sent to your provider as well.   ?To learn more about what you can do with MyChart, go to [email or web address].   ? ?Your next appointment:   ?12 month(s) ? ?The format for your next appointment:   ?In Person ? ?Provider:   ?Shelva Majestic, MD { ? ?

## 2022-03-15 LAB — COMPREHENSIVE METABOLIC PANEL
ALT: 24 IU/L (ref 0–44)
AST: 26 IU/L (ref 0–40)
Albumin/Globulin Ratio: 1.7 (ref 1.2–2.2)
Albumin: 4.3 g/dL (ref 3.8–4.8)
Alkaline Phosphatase: 72 IU/L (ref 44–121)
BUN/Creatinine Ratio: 11 (ref 10–24)
BUN: 13 mg/dL (ref 8–27)
Bilirubin Total: 0.4 mg/dL (ref 0.0–1.2)
CO2: 24 mmol/L (ref 20–29)
Calcium: 9.3 mg/dL (ref 8.6–10.2)
Chloride: 102 mmol/L (ref 96–106)
Creatinine, Ser: 1.16 mg/dL (ref 0.76–1.27)
Globulin, Total: 2.5 g/dL (ref 1.5–4.5)
Glucose: 206 mg/dL — ABNORMAL HIGH (ref 70–99)
Potassium: 5.1 mmol/L (ref 3.5–5.2)
Sodium: 138 mmol/L (ref 134–144)
Total Protein: 6.8 g/dL (ref 6.0–8.5)
eGFR: 69 mL/min/{1.73_m2} (ref >59)

## 2022-03-15 LAB — CBC WITH DIFFERENTIAL/PLATELET
Basophils Absolute: 0 10*3/uL (ref 0.0–0.2)
Basos: 1 %
EOS (ABSOLUTE): 0.2 10*3/uL (ref 0.0–0.4)
Eos: 2 %
Hematocrit: 43 % (ref 37.5–51.0)
Hemoglobin: 14.9 g/dL (ref 13.0–17.7)
Immature Grans (Abs): 0 10*3/uL (ref 0.0–0.1)
Immature Granulocytes: 0 %
Lymphocytes Absolute: 2.5 10*3/uL (ref 0.7–3.1)
Lymphs: 29 %
MCH: 31.8 pg (ref 26.6–33.0)
MCHC: 34.7 g/dL (ref 31.5–35.7)
MCV: 92 fL (ref 79–97)
Monocytes Absolute: 0.9 10*3/uL (ref 0.1–0.9)
Monocytes: 10 %
Neutrophils Absolute: 5.1 10*3/uL (ref 1.4–7.0)
Neutrophils: 58 %
Platelets: 215 10*3/uL (ref 150–450)
RBC: 4.69 x10E6/uL (ref 4.14–5.80)
RDW: 11.6 % (ref 11.6–15.4)
WBC: 8.7 10*3/uL (ref 3.4–10.8)

## 2022-03-15 LAB — LIPID PANEL
Chol/HDL Ratio: 2.9 ratio (ref 0.0–5.0)
Cholesterol, Total: 126 mg/dL (ref 100–199)
HDL: 43 mg/dL (ref >39)
LDL Chol Calc (NIH): 66 mg/dL (ref 0–99)
Triglycerides: 87 mg/dL (ref 0–149)
VLDL Cholesterol Cal: 17 mg/dL (ref 5–40)

## 2022-03-15 LAB — TSH: TSH: 4.14 u[IU]/mL (ref 0.450–4.500)

## 2022-03-15 LAB — LIPOPROTEIN A (LPA): Lipoprotein (a): 269.4 nmol/L — ABNORMAL HIGH (ref <75.0)

## 2022-04-09 ENCOUNTER — Other Ambulatory Visit: Payer: Self-pay | Admitting: Cardiovascular Disease

## 2022-04-09 DIAGNOSIS — I10 Essential (primary) hypertension: Secondary | ICD-10-CM

## 2022-07-18 ENCOUNTER — Telehealth: Payer: Self-pay | Admitting: Cardiovascular Disease

## 2022-07-18 NOTE — Telephone Encounter (Signed)
Patient reports that the last 2 evening his blood pressure was 154/94 and one episode where pulse was 47. This Am 120/70. He stated he has a lot of stressors right now with moving. He denies chest pain, palpitations, sob, dizziness, nausea. Stated he drinks ETOH 1-2 times daily. Patient also asked if metoprolol succinate '75mg'$  dose was too much. Patient wants to be checked to make sure "everything is all right with my heart." Scheduled appointment with Vella Raring, NP for 7/26.

## 2022-07-18 NOTE — Telephone Encounter (Signed)
Pt c/o BP issue: STAT if pt c/o blurred vision, one-sided weakness or slurred speech  1. What are your last 5 BP readings? 150/90; 120/70  2. Are you having any other symptoms (ex. Dizziness, headache, blurred vision, passed out)? No   3. What is your BP issue? Elevated BP

## 2022-07-18 NOTE — Telephone Encounter (Signed)
Noted. Will address at clinic visit. Thanks!  Loel Dubonnet, NP

## 2022-07-19 NOTE — Progress Notes (Unsigned)
Cardiology Office Note:    Date:  07/20/2022   ID:  Jesus Francis, DOB 1956-03-29, MRN [id number]  PCP:  Antony Contras, MD   [facility] Cardiologist:  Shelva Majestic, MD     Referring MD: Antony Contras, MD   CC of elevated blood pressure and palpitations  History of Present Illness:    Jesus Francis is a 66 y.o. male with a hx of the following:  CAD, x/p CABG X 4 01/2020 HTN T2DM Hx of carotid endarterectomy Mixed hyperlipidemia Adjustment disorder  His cardiac history goes back to 2021 when he had chest pain.  Coronary CTA revealed a calcium score of 2195 placing him in the 98th percentile for age/sex.  He was noted to have severe pRCA and mLAD lesions, significant stenosis in the left circumflex.  Echo showed EF of 60 to 65%, mild AI, no RWMA.  Left heart cath done on February 2021 revealed severe multivessel coronary artery disease of the LAD and RCA, with moderate ramus lesions.  He underwent CABG work-up that revealed severe left ICA stenosis.  He underwent left CEA and CABG with LIMA to LAD SVG-OM1, SVG-PDA, and SVG-diagonal on February 12, 2020.  Experienced A-fib with RVR postop with conversion to normal sinus rhythm on amiodarone.  Was weaned off amiodarone outpatient.  No recurrence of A-fib.  Repeat echocardiogram done June 2021 revealed EF of 55 to 38%, grade 1 diastolic dysfunction, no RWMA.   Repeat carotid Dopplers 11/2021 was unchanged from previous year.  Right carotid revealed 40 to 59% ICA stenosis and left carotid revealed 1 to 39% ICA stenosis.  We will follow-up with vascular surgery in 1 year and repeat carotid duplex.  Last seen in the cardiology office on March 03, 2022 and seen by Dr. Claiborne Billings.  Was doing well from a cardiac perspective.  Continue to remain active and blood pressures at home are stable.  Denied any shortness of breath or chest pain.  He called the office on 07/18/22 w/ CC of BP of 154/94 in PM and one episode of 47 HR.  Most recent AM BP was 120/70.  Denied chest pain, shortness of breath, palpitations, dizziness, nausea.  Under a lot of current stressors due to moving.  He he is wondering if his metoprolol succinate 75 mg dose is too much.  States he drinks ETOH 1-2 times daily.  He is requesting to be evaluated.  Today he presents for follow up. CC of elevated blood pressure readings over the weekend and recent palpitations at rest.  States he is not overly concerned regarding his recent palpitations. His pulse ox showed pulse of 47 that went up to normal over the weekend.  He attributes his symptoms to being under stress recently, as he and his wife are in the process of moving.  Admits to recent salt intake over the weekend, with consuming too much carbs and sugar.  Weight is down 6 pounds from the weekend due to stress.  Normally adheres to healthy diet and does intermittent fasting.  Denies any chest pain, shortness of breath, swelling, orthopnea, PND, dizziness, syncope, presyncope, or headache.  Stays active by working around the house and mowing the lawn.  Works as a Hotel manager for [employer] and denies any recent stress with his job.  Occasionally does smoke cigars and says he does not inhale the smoke.  Drinks 1 alcoholic drink a day (Audit score today: 4) and denies any illicit drug use.  Recent elevated blood pressure  readings (154/90) in the evening is before he takes metoprolol at 10 PM.  Does admit to history of whitecoat hypertension.  Denies any other questions or concerns today.   Past Medical History:  Diagnosis Date   Carotid artery occlusion    Coronary artery disease    Hyperlipidemia    Hypertension     Past Surgical History:  Procedure Laterality Date   CARDIAC CATHETERIZATION     CAROTID ENDARTERECTOMY     CORONARY ARTERY BYPASS GRAFT N/A 02/12/2020   Procedure: CORONARY ARTERY BYPASS GRAFTING (CABG) times four, using left internal mammary artery and right greater saphenous vein harvested  endoscopically.;  Surgeon: Lajuana Matte, MD;  Location: [facility];  Service: Open Heart Surgery;  Laterality: N/A;   ENDARTERECTOMY Left 02/12/2020   Procedure: ENDARTERECTOMY CAROTID;  Surgeon: Rosetta Posner, MD;  Location: [facility];  Service: Vascular;  Laterality: Left;   LEFT HEART CATH AND CORONARY ANGIOGRAPHY N/A 02/11/2020   Procedure: LEFT HEART CATH AND CORONARY ANGIOGRAPHY;  Surgeon: Troy Sine, MD;  Location: [facility];  Service: Cardiovascular;  Laterality: N/A;   TEE WITHOUT CARDIOVERSION N/A 02/12/2020   Procedure: TRANSESOPHAGEAL ECHOCARDIOGRAM (TEE);  Surgeon: Lajuana Matte, MD;  Location: [facility];  Service: Open Heart Surgery;  Laterality: N/A;    Current Medications: Current Meds  Medication Sig   aspirin EC 81 MG tablet Take 81 mg by mouth at bedtime.    ezetimibe (ZETIA) 10 MG tablet TAKE ONE TABLET BY MOUTH DAILY   metoprolol succinate (TOPROL-XL) 50 MG 24 hr tablet TAKE 1 AND 1/2 TABLET BY MOUTH DAILY   nitroGLYCERIN (NITROSTAT) 0.4 MG SL tablet DISSOLVE 1 TAB UNDER TONGUE FOR CHEST PAIN - IF PAIN REMAINS AFTER 5 MIN, CALL 911 AND REPEAT DOSE. MAX 3 TABS IN 15 MINUTES   rosuvastatin (CRESTOR) 40 MG tablet TAKE ONE TABLET BY MOUTH EVERY EVENING     Allergies:   Patient has no known allergies.   Social History   Socioeconomic History   Marital status: Widowed    Spouse name: Not on file   Number of children: 2   Years of education: 16   Highest education level: Bachelor's degree (e.g., BA, AB, BS)  Occupational History   Not on file  Tobacco Use   Smoking status: Never   Smokeless tobacco: Never  Vaping Use   Vaping Use: Never used  Substance and Sexual Activity   Alcohol use: Yes    Alcohol/week: 3.0 standard drinks of alcohol    Types: 1 Glasses of wine, 1 Cans of beer, 1 Shots of liquor per week   Drug use: Never   Sexual activity: Not on file  Other Topics Concern   Not on file  Social History Narrative   Not on file   Social  Determinants of Health   Financial Resource Strain: Not on file  Food Insecurity: Not on file  Transportation Needs: Not on file  Physical Activity: Not on file  Stress: Not on file  Social Connections: Not on file     Family History: The patient's family history includes Heart attack in his brother, brother, and father; Heart disease in his brother, sister, and sister.  ROS:   Review of Systems  Constitutional:  Positive for weight loss. Negative for chills, fever and malaise/fatigue.       See HPI.  HENT: Negative.    Eyes: Negative.   Respiratory:  Negative for cough, hemoptysis, sputum production, shortness of breath and  wheezing.   Cardiovascular:  Positive for palpitations. Negative for chest pain, orthopnea, claudication, leg swelling and PND.       See HPI.  Gastrointestinal: Negative.   Musculoskeletal: Negative.   Skin:  Negative for itching and rash.  Neurological: Negative.  Negative for dizziness, tingling, tremors, sensory change, speech change, focal weakness, seizures, loss of consciousness, weakness and headaches.       Chronic numbness along mid chest and medial aspect of right lower leg from previous CABG.   Endo/Heme/Allergies: Negative.   Psychiatric/Behavioral:  Negative for depression, hallucinations, memory loss, substance abuse and suicidal ideas. The patient is nervous/anxious. The patient does not have insomnia.    Please see the history of present illness.    All other systems reviewed and are negative.  EKGs/Labs/Other Studies Reviewed:    The following studies were reviewed today:   EKG:  EKG is ordered today.  The ekg ordered today demonstrates Normal sinus rhythm, 77 bpm, without PAC's or PVC's and without any acute changes.   Carotid ultrasound on December 06, 2021: Right carotid: Velocities in the right ICA are consistent with a 40 to 59% stenosis. Left carotid: Velocities in the left ICA are consistent 101 to 39% stenosis.  Patent  CEA. Vertebrals: Bilateral vertebral arteries demonstrate antegrade flow. Subclavian's: Normal flow hemodynamics are seen in bilateral subclavian arteries.   Carotid duplex bilateral on December 04, 2020: Right carotid: Velocitis in the right ICA are consistent with a 40--59% stenosis.  Left carotid: Velocities in the left ICA are consistent with a 1-39% stenosis. Patent CEA.  Vertebrals: Bilateral vertebral arteries demonstrate antegrade flow.  Subclavians: Normal flow hemodynamics were seen in bilateral subclavian arteries.    2D echo on June 09, 2020:  LVEF is 55 to 60% normal left ventricle function with no regional wall motion abnormalities.  Grade 1 diastolic dysfunction.  There is abnormal (paradoxical) septal motion, consistent with right ventricular volume overload. Right ventricular systolic function is mildly reduced.  The right ventricular size mildly enlarged.  There is normal pulmonary artery systolic pressure.  Estimated right ventricular systolic pressure is 96.7 mmHg.  The mitral valve is grossly normal.  Trivial mitral valve regurgitation.  No evidence of mitral stenosis. Tricuspid aortic valve with mild regurgitation.  Mild to moderate aortic valve sclerosis/calcification is present, without any evidence of aortic stenosis. Inferior vena cava is normal in size with greater than 50% respiratory variability, suggesting right artrial pressure of 3 mmHg.  Left heart cath and coronary angiography on June 10, 2020: Ramus lesion is 50% stenosed. Dist LM to Ost LAD lesion is 30% stenosed. 1st Mrg lesion is 60% stenosed. Prox LAD lesion is 90% stenosed. Prox LAD to Mid LAD lesion is 60% stenosed. Mid LAD lesion is 70% stenosed. Prox RCA lesion is 99% stenosed. Ost RCA to Prox RCA lesion is 90% stenosed. Mid RCA lesion is 50% stenosed. Dist RCA lesion is 20% stenosed.   Severe coronary calcification with multivessel CAD.  There is 30% distal narrowing of the left main coronary  artery prior to its trifurcation into the LAD, ramus intermediate, and left circumflex vessel. The LAD has 90% very proximal (near ostial) stenosis followed by diffuse 60 and 70% proximal to mid calcified stenoses with diffuse 70% stenosis in the diagonal vessel; 50% very proximal ramus intermediate stenosis; 60% ostial OM1 stenosis and large dominant RCA with 90% proximal followed by 99% calcified proximal to mid stenosis with 50% mid stenosis and mild 20% areas of luminal irregularity.  Pre-CABG carotid Doppler on June 10, 2020: Right carotid: Velocities in the right ICA are consistent with a 40 to 59% stenosis.  The ECA appears greater than 50% stenosis. Left carotid: Velocities in the left ICA are consistent with 80 to 99% stenosis.  Vertebrals: Bilateral vertebral arteries demonstrate antegrade flow.  Subclavian's: Normal flow and hemodynamics are seen in bilateral subclavian arteries. Right ABI is within normal range.  No evidence of significant right lower extremity arterial disease.  Left ABI is within normal range.  No evidence of significant left lower extremity arterial disease.  Left upper extremity: Doppler waveform obliterate with left radial compression.  Doppler waveforms decreased greater than 50% with left ulnar compression.     Coronary CTA and FFR on January 30, 2020: 1.  Coronary calcium score of 2195.  This was 98th percentile for age and sex matched control. 2.  Normal coronary origin with right dominance. 3.  Severe calcified plaque in the proximal RCA and proximal to mid LAD causing severe stenosis. 4.  CAD-RADS 4.  Severe stenosis.  (70 to 99% or greater than 50% left main).  Cardiac catheterization or CT FFR is recommended.  Consider symptom guided anti-ischemic pharmacotherapy as well as risk factor modification per guideline directed care.  Additional analysis with CT FFR will be submitted and reported separately. 5.  CT FFR showed significant stenosis involving the LAD,  RCA and left circumflex.     Recent Labs: 03/14/2022: ALT 24; BUN 13; Creatinine, Ser 1.16; Hemoglobin 14.9; Platelets 215; Potassium 5.1; Sodium 138; TSH 4.140  Recent Lipid Panel    Component Value Date/Time   CHOL 126 03/14/2022 0811   TRIG 87 03/14/2022 0811   HDL 43 03/14/2022 0811   CHOLHDL 2.9 03/14/2022 0811   LDLCALC 66 03/14/2022 0811         Physical Exam:    VS:  BP (!) 158/90 (BP Location: Right Arm, Patient Position: Sitting, Cuff Size: Large)   Pulse 77   Ht '5\' 10"'$  (1.778 m)   Wt 210 lb 6.4 oz (95.4 kg)   BMI 30.19 kg/m     Wt Readings from Last 3 Encounters:  07/20/22 210 lb 6.4 oz (95.4 kg)  03/03/22 211 lb 6.4 oz (95.9 kg)  12/06/21 213 lb 6.4 oz (96.8 kg)     GEN: Well nourished, well developed in no acute distress HEENT: Normal NECK: No JVD; No carotid bruits CARDIAC: RRR, no murmurs, rubs, gallops; 2+ pulses throughout, strong and equal bilaterally RESPIRATORY:  Clear to auscultation without rales, wheezing or rhonchi  ABDOMEN: Soft, non-tender, non-distended, bowel sounds X 4 MUSCULOSKELETAL:  No edema; No deformity  SKIN: Warm and dry NEUROLOGIC:  Alert and oriented x 3, chronic numbness in right lower leg and mid chest from CABG sx.  PSYCHIATRIC:  Pleasant and cooperative, but does appear Anxious and stressed  ASSESSMENT:    1. Palpitations   2. CAD in native artery   3. S/P CABG x 4   4. Essential hypertension, benign   5. Hyperlipidemia with target LDL less than 70   6. Atrial fibrillation, unspecified type ([place])   7. History of CEA (carotid endarterectomy)   8. Stenosis of left carotid artery    PLAN:    In order of problems listed above:  Palpitations - acute, stable Pt attributes this to recent stress.  Offered a 14-day ZIO monitor but he declined.  Recommended stress management and lifestyle modifications. Will reevaluate him in 2-3 months for palpitations and he will  reach out via MyChart if his symptoms worsen or change.   Continue current medication regimen.  Recent TSH was normal in March 2023.  2. CAD, s/p CABG x 4 in 2021, Hyperlipidemia  with LDL goal < 70 - chronic, stable Stable with no anginal symptoms. No indication for ischemic evaluation.  Lipid panel and liver enzymes checked in March 2023 and were all within normal limits.  Continue current medications.  3. Essential hypertension - chronic, stable BP 158/90 in the right arm and 160/102 in the left arm. Recheck BP was 139/72.  He attributes elevated readings d/t whitecoat hypertension. Discussed to monitor BP at home at least 2 hours after medications and sitting for 5-10 minutes and recommended to take Metoprolol after dinner instead of 10 PM.  Discussed that I want him to check blood pressures for 2 weeks and let me know via MyChart.  Pt declines changes to his blood pressure medication today. Continue current antihypertensive regimen.   4. Post-op A-fib - acute, resolved Recent palpitations over the weekend however he declines a heart monitor.  Twelve-lead EKG revealed normal sinus rhythm, 77 bpm, with no acute changes.  Denies any other associated symptoms with palpitations or fast heart rates. Does not require anticoagulation therapy as postop A-fib was resolved with amiodarone that he has weaned off of. We will continue to monitor and continue current medication regimen.    5. Hx of CEA (left) and stenosis of right carotid artery - chronic, stable Most recent carotid duplex performed on December 2022 was stable and did not reveal any change.  Right carotid revealed a 40 to 59% stenosis.  Continue to follow-up with vascular and repeat carotid duplex in December of this year.  Continue current medication regimen.   6. Disposition: F/U in 2-3 months or sooner if anything changes.      Medication Adjustments/Labs and Tests Ordered: Current medicines are reviewed at length with the patient today.  Concerns regarding medicines are outlined above.   Orders Placed This Encounter  Procedures   EKG 12-Lead   No orders of the defined types were placed in this encounter.   Patient Instructions  Medication Instructions:  Your Physician recommend you continue on your current medication as directed.    *If you need a refill on your cardiac medications before your next appointment, please call your pharmacy*   Follow-Up: At [facility], you and your health needs are our priority.  As part of our continuing mission to provide you with exceptional heart care, we have created designated Provider Care Teams.  These Care Teams include your primary Cardiologist (physician) and Advanced Practice Providers (APPs -  Physician Assistants and Nurse Practitioners) who all work together to provide you with the care you need, when you need it.  We recommend signing up for the patient portal called "MyChart".  Sign up information is provided on this After Visit Summary.  MyChart is used to connect with patients for Virtual Visits (Telemedicine).  Patients are able to view lab/test results, encounter notes, upcoming appointments, etc.  Non-urgent messages can be sent to your provider as well.   To learn more about what you can do with MyChart, go to [email or web address].    Your next appointment:   2-3 month(s)  The format for your next appointment:   In Person  Provider:   Shelva Majestic, MD {  Other Instructions To prevent palpitations: Make sure you are adequately hydrated.  Avoid and/or limit caffeine containing beverages like soda or tea.  Exercise regularly.  Manage stress well. Some over the counter medications can cause palpitations such as Benadryl, AdvilPM, TylenolPM. Regular Advil or Tylenol do not cause palpitations.   Managing Anxiety, Adult After being diagnosed with anxiety, you may be relieved to know why you have felt or behaved a certain way. You may also feel overwhelmed about the treatment ahead and what it will mean for  your life. With care and support, you can manage this condition. How to manage lifestyle changes Managing stress and anxiety  Stress is your body's reaction to life changes and events, both good and bad. Most stress will last just a few hours, but stress can be ongoing and can lead to more than just stress. Although stress can play a major role in anxiety, it is not the same as anxiety. Stress is usually caused by something external, such as a deadline, test, or competition. Stress normally passes after the triggering event has ended.  Anxiety is caused by something internal, such as imagining a terrible outcome or worrying that something will go wrong that will devastate you. Anxiety often does not go away even after the triggering event is over, and it can become long-term (chronic) worry. It is important to understand the differences between stress and anxiety and to manage your stress effectively so that it does not lead to an anxious response. Talk with your health care provider or a counselor to learn more about reducing anxiety and stress. He or she may suggest tension reduction techniques, such as: Music therapy. Spend time creating or listening to music that you enjoy and that inspires you. Mindfulness-based meditation. Practice being aware of your normal breaths while not trying to control your breathing. It can be done while sitting or walking. Centering prayer. This involves focusing on a word, phrase, or sacred image that means something to you and brings you peace. Deep breathing. To do this, expand your stomach and inhale slowly through your nose. Hold your breath for 3-5 seconds. Then exhale slowly, letting your stomach muscles relax. Self-talk. Learn to notice and identify thought patterns that lead to anxiety reactions and change those patterns to thoughts that feel peaceful. Muscle relaxation. Taking time to tense muscles and then relax them. Choose a tension reduction technique that  fits your lifestyle and personality. These techniques take time and practice. Set aside 5-15 minutes a day to do them. Therapists can offer counseling and training in these techniques. The training to help with anxiety may be covered by some insurance plans. Other things you can do to manage stress and anxiety include: Keeping a stress diary. This can help you learn what triggers your reaction and then learn ways to manage your response. Thinking about how you react to certain situations. You may not be able to control everything, but you can control your response. Making time for activities that help you relax and not feeling guilty about spending your time in this way. Doing visual imagery. This involves imagining or creating mental pictures to help you relax. Practicing yoga. Through yoga poses, you can lower tension and promote relaxation.  Medicines Medicines can help ease symptoms. Medicines for anxiety include: Antidepressant medicines. These are usually prescribed for long-term daily control. Anti-anxiety medicines. These may be added in severe cases, especially when panic attacks occur. Medicines will be prescribed by a health care provider. When used together, medicines, psychotherapy, and tension reduction techniques may be the most effective treatment. Relationships Relationships can play a big part in helping you  recover. Try to spend more time connecting with trusted friends and family members. Consider going to couples counseling if you have a partner, taking family education classes, or going to family therapy. Therapy can help you and others better understand your condition. How to recognize changes in your anxiety Everyone responds differently to treatment for anxiety. Recovery from anxiety happens when symptoms decrease and stop interfering with your daily activities at home or work. This may mean that you will start to: Have better concentration and focus. Worry will interfere  less in your daily thinking. Sleep better. Be less irritable. Have more energy. Have improved memory. It is also important to recognize when your condition is getting worse. Contact your health care provider if your symptoms interfere with home or work and you feel like your condition is not improving. Follow these instructions at home: Activity Exercise. Adults should do the following: Exercise for at least 150 minutes each week. The exercise should increase your heart rate and make you sweat (moderate-intensity exercise). Strengthening exercises at least twice a week. Get the right amount and quality of sleep. Most adults need 7-9 hours of sleep each night. Lifestyle  Eat a healthy diet that includes plenty of vegetables, fruits, whole grains, low-fat dairy products, and lean protein. Do not eat a lot of foods that are high in fats, added sugars, or salt (sodium). Make choices that simplify your life. Do not use any products that contain nicotine or tobacco. These products include cigarettes, chewing tobacco, and vaping devices, such as e-cigarettes. If you need help quitting, ask your health care provider. Avoid caffeine, alcohol, and certain over-the-counter cold medicines. These may make you feel worse. Ask your pharmacist which medicines to avoid. General instructions Take over-the-counter and prescription medicines only as told by your health care provider. Keep all follow-up visits. This is important. Where to find support You can get help and support from these sources: Self-help groups. Online and [employer]. A trusted spiritual leader. Couples counseling. Family education classes. Family therapy. Where to find more information You may find that joining a support group helps you deal with your anxiety. The following sources can help you locate counselors or support groups near you: [place]: www.mentalhealth[place]ica.net Anxiety and Depression  Association of [place] (ADAA): https://www.clark.net/ National Alliance on Mental Illness (NAMI): www.nami.org Contact a health care provider if: You have a hard time staying focused or finishing daily tasks. You spend many hours a day feeling worried about everyday life. You become exhausted by worry. You start to have headaches or frequently feel tense. You develop chronic nausea or diarrhea. Get help right away if: You have a racing heart and shortness of breath. You have thoughts of hurting yourself or others. If you ever feel like you may hurt yourself or others, or have thoughts about taking your own life, get help right away. Go to your nearest emergency department or: Call your local emergency services (911 in the [place] Call a suicide crisis helpline, such as the [place] at 304-448-4179 or 988 in the [place]. This is open 24 hours a day in the [place] Text the Crisis Text Line at (313)456-8451 (in the [place].). Summary Taking steps to learn and use tension reduction techniques can help calm you and help prevent triggering an anxiety reaction. When used together, medicines, psychotherapy, and tension reduction techniques may be the most effective treatment. Family, friends, and partners can play a big part in supporting you. This information is not intended to  replace advice given to you by your health care provider. Make sure you discuss any questions you have with your health care provider. Document Revised: 07/07/2021 Document Reviewed: 04/04/2021 Elsevier Patient Education  [place].  Low-Sodium Eating Plan Sodium, which is an element that makes up salt, helps you maintain a healthy balance of fluids in your body. Too much sodium can increase your blood pressure and cause fluid and waste to be held in your body. Your health care provider or dietitian may recommend following this plan if you have high blood pressure (hypertension), kidney disease, liver disease, or  heart failure. Eating less sodium can help lower your blood pressure, reduce swelling, and protect your heart, liver, and kidneys. What are tips for following this plan? Reading food labels The Nutrition Facts label lists the amount of sodium in one serving of the food. If you eat more than one serving, you must multiply the listed amount of sodium by the number of servings. Choose foods with less than 140 mg of sodium per serving. Avoid foods with 300 mg of sodium or more per serving. Shopping  Look for lower-sodium products, often labeled as "low-sodium" or "no salt added." Always check the sodium content, even if foods are labeled as "unsalted" or "no salt added." Buy fresh foods. Avoid canned foods and pre-made or frozen meals. Avoid canned, cured, or processed meats. Buy breads that have less than 80 mg of sodium per slice. Cooking  Eat more home-cooked food and less restaurant, buffet, and fast food. Avoid adding salt when cooking. Use salt-free seasonings or herbs instead of table salt or sea salt. Check with your health care provider or pharmacist before using salt substitutes. Cook with plant-based oils, such as canola, sunflower, or olive oil. Meal planning When eating at a restaurant, ask that your food be prepared with less salt or no salt, if possible. Avoid dishes labeled as brined, pickled, cured, smoked, or made with soy sauce, miso, or teriyaki sauce. Avoid foods that contain MSG (monosodium glutamate). MSG is sometimes added to [place] food, bouillon, and some canned foods. Make meals that can be grilled, baked, poached, roasted, or steamed. These are generally made with less sodium. General information Most people on this plan should limit their sodium intake to 1,500-2,000 mg (milligrams) of sodium each day. What foods should I eat? Fruits Fresh, frozen, or canned fruit. Fruit juice. Vegetables Fresh or frozen vegetables. "No salt added" canned vegetables. "No salt  added" tomato sauce and paste. Low-sodium or reduced-sodium tomato and vegetable juice. Grains Low-sodium cereals, including oats, puffed wheat and rice, and shredded wheat. Low-sodium crackers. Unsalted rice. Unsalted pasta. Low-sodium bread. Whole-grain breads and whole-grain pasta. Meats and other proteins Fresh or frozen (no salt added) meat, poultry, seafood, and fish. Low-sodium canned tuna and salmon. Unsalted nuts. Dried peas, beans, and lentils without added salt. Unsalted canned beans. Eggs. Unsalted nut butters. Dairy Milk. Soy milk. Cheese that is naturally low in sodium, such as ricotta cheese, fresh mozzarella, or Swiss cheese. Low-sodium or reduced-sodium cheese. Cream cheese. Yogurt. Seasonings and condiments Fresh and dried herbs and spices. Salt-free seasonings. Low-sodium mustard and ketchup. Sodium-free salad dressing. Sodium-free light mayonnaise. Fresh or refrigerated horseradish. Lemon juice. Vinegar. Other foods Homemade, reduced-sodium, or low-sodium soups. Unsalted popcorn and pretzels. Low-salt or salt-free chips. The items listed above may not be a complete list of foods and beverages you can eat. Contact a dietitian for more information. What foods should I avoid? Vegetables Sauerkraut, pickled vegetables, and  relishes. Olives. [place] fries. Onion rings. Regular canned vegetables (not low-sodium or reduced-sodium). Regular canned tomato sauce and paste (not low-sodium or reduced-sodium). Regular tomato and vegetable juice (not low-sodium or reduced-sodium). Frozen vegetables in sauces. Grains Instant hot cereals. Bread stuffing, pancake, and biscuit mixes. Croutons. Seasoned rice or pasta mixes. Noodle soup cups. Boxed or frozen macaroni and cheese. Regular salted crackers. Self-rising flour. Meats and other proteins Meat or fish that is salted, canned, smoked, spiced, or pickled. Precooked or cured meat, such as sausages or meat loaves. Berniece Salines. Ham. Pepperoni. Hot dogs.  Corned beef. Chipped beef. Salt pork. Jerky. Pickled herring. Anchovies and sardines. Regular canned tuna. Salted nuts. Dairy Processed cheese and cheese spreads. Hard cheeses. Cheese curds. Blue cheese. Feta cheese. String cheese. Regular cottage cheese. Buttermilk. Canned milk. Fats and oils Salted butter. Regular margarine. Ghee. Bacon fat. Seasonings and condiments Onion salt, garlic salt, seasoned salt, table salt, and sea salt. Canned and packaged gravies. Worcestershire sauce. Tartar sauce. Barbecue sauce. Teriyaki sauce. Soy sauce, including reduced-sodium. Steak sauce. Fish sauce. Oyster sauce. Cocktail sauce. Horseradish that you find on the shelf. Regular ketchup and mustard. Meat flavorings and tenderizers. Bouillon cubes. Hot sauce. Pre-made or packaged marinades. Pre-made or packaged taco seasonings. Relishes. Regular salad dressings. Salsa. Other foods Salted popcorn and pretzels. Corn chips and puffs. Potato and tortilla chips. Canned or dried soups. Pizza. Frozen entrees and pot pies. The items listed above may not be a complete list of foods and beverages you should avoid. Contact a dietitian for more information. Summary Eating less sodium can help lower your blood pressure, reduce swelling, and protect your heart, liver, and kidneys. Most people on this plan should limit their sodium intake to 1,500-2,000 mg (milligrams) of sodium each day. Canned, boxed, and frozen foods are high in sodium. Restaurant foods, fast foods, and pizza are also very high in sodium. You also get sodium by adding salt to food. Try to cook at home, eat more fresh fruits and vegetables, and eat less fast food and canned, processed, or prepared foods. This information is not intended to replace advice given to you by your health care provider. Make sure you discuss any questions you have with your health care provider. Document Revised: 01/17/2020 Document Reviewed: 11/13/2019 Elsevier Patient Education   2023 [place], [place], [place]  07/20/2022 9:58 AM    [place]

## 2022-07-20 ENCOUNTER — Ambulatory Visit (INDEPENDENT_AMBULATORY_CARE_PROVIDER_SITE_OTHER): Payer: No Typology Code available for payment source | Admitting: Nurse Practitioner

## 2022-07-20 ENCOUNTER — Encounter (HOSPITAL_BASED_OUTPATIENT_CLINIC_OR_DEPARTMENT_OTHER): Payer: Self-pay | Admitting: Nurse Practitioner

## 2022-07-20 VITALS — BP 158/90 | HR 77 | Ht 70.0 in | Wt 210.4 lb

## 2022-07-20 DIAGNOSIS — R002 Palpitations: Secondary | ICD-10-CM | POA: Diagnosis not present

## 2022-07-20 DIAGNOSIS — Z951 Presence of aortocoronary bypass graft: Secondary | ICD-10-CM | POA: Diagnosis not present

## 2022-07-20 DIAGNOSIS — I4891 Unspecified atrial fibrillation: Secondary | ICD-10-CM

## 2022-07-20 DIAGNOSIS — E785 Hyperlipidemia, unspecified: Secondary | ICD-10-CM | POA: Diagnosis not present

## 2022-07-20 DIAGNOSIS — I6522 Occlusion and stenosis of left carotid artery: Secondary | ICD-10-CM

## 2022-07-20 DIAGNOSIS — Z9889 Other specified postprocedural states: Secondary | ICD-10-CM

## 2022-07-20 DIAGNOSIS — I6521 Occlusion and stenosis of right carotid artery: Secondary | ICD-10-CM

## 2022-07-20 DIAGNOSIS — I2510 Atherosclerotic heart disease of native coronary artery without angina pectoris: Secondary | ICD-10-CM

## 2022-07-20 DIAGNOSIS — I10 Essential (primary) hypertension: Secondary | ICD-10-CM

## 2022-07-20 NOTE — Patient Instructions (Signed)
Medication Instructions:  Your Physician recommend you continue on your current medication as directed.    *If you need a refill on your cardiac medications before your next appointment, please call your pharmacy*   Follow-Up: At [facility], you and your health needs are our priority.  As part of our continuing mission to provide you with exceptional heart care, we have created designated Provider Care Teams.  These Care Teams include your primary Cardiologist (physician) and Advanced Practice Providers (APPs -  Physician Assistants and Nurse Practitioners) who all work together to provide you with the care you need, when you need it.  We recommend signing up for the patient portal called "MyChart".  Sign up information is provided on this After Visit Summary.  MyChart is used to connect with patients for Virtual Visits (Telemedicine).  Patients are able to view lab/test results, encounter notes, upcoming appointments, etc.  Non-urgent messages can be sent to your provider as well.   To learn more about what you can do with MyChart, go to [email or web address].    Your next appointment:   2-3 month(s)  The format for your next appointment:   In Person  Provider:   Shelva Majestic, MD {  Other Instructions To prevent palpitations: Make sure you are adequately hydrated.  Avoid and/or limit caffeine containing beverages like soda or tea. Exercise regularly.  Manage stress well. Some over the counter medications can cause palpitations such as Benadryl, AdvilPM, TylenolPM. Regular Advil or Tylenol do not cause palpitations.   Managing Anxiety, Adult After being diagnosed with anxiety, you may be relieved to know why you have felt or behaved a certain way. You may also feel overwhelmed about the treatment ahead and what it will mean for your life. With care and support, you can manage this condition. How to manage lifestyle changes Managing stress and anxiety  Stress is your body's  reaction to life changes and events, both good and bad. Most stress will last just a few hours, but stress can be ongoing and can lead to more than just stress. Although stress can play a major role in anxiety, it is not the same as anxiety. Stress is usually caused by something external, such as a deadline, test, or competition. Stress normally passes after the triggering event has ended.  Anxiety is caused by something internal, such as imagining a terrible outcome or worrying that something will go wrong that will devastate you. Anxiety often does not go away even after the triggering event is over, and it can become long-term (chronic) worry. It is important to understand the differences between stress and anxiety and to manage your stress effectively so that it does not lead to an anxious response. Talk with your health care provider or a counselor to learn more about reducing anxiety and stress. He or she may suggest tension reduction techniques, such as: Music therapy. Spend time creating or listening to music that you enjoy and that inspires you. Mindfulness-based meditation. Practice being aware of your normal breaths while not trying to control your breathing. It can be done while sitting or walking. Centering prayer. This involves focusing on a word, phrase, or sacred image that means something to you and brings you peace. Deep breathing. To do this, expand your stomach and inhale slowly through your nose. Hold your breath for 3-5 seconds. Then exhale slowly, letting your stomach muscles relax. Self-talk. Learn to notice and identify thought patterns that lead to anxiety reactions and change those patterns to thoughts  that feel peaceful. Muscle relaxation. Taking time to tense muscles and then relax them. Choose a tension reduction technique that fits your lifestyle and personality. These techniques take time and practice. Set aside 5-15 minutes a day to do them. Therapists can offer counseling  and training in these techniques. The training to help with anxiety may be covered by some insurance plans. Other things you can do to manage stress and anxiety include: Keeping a stress diary. This can help you learn what triggers your reaction and then learn ways to manage your response. Thinking about how you react to certain situations. You may not be able to control everything, but you can control your response. Making time for activities that help you relax and not feeling guilty about spending your time in this way. Doing visual imagery. This involves imagining or creating mental pictures to help you relax. Practicing yoga. Through yoga poses, you can lower tension and promote relaxation.  Medicines Medicines can help ease symptoms. Medicines for anxiety include: Antidepressant medicines. These are usually prescribed for long-term daily control. Anti-anxiety medicines. These may be added in severe cases, especially when panic attacks occur. Medicines will be prescribed by a health care provider. When used together, medicines, psychotherapy, and tension reduction techniques may be the most effective treatment. Relationships Relationships can play a big part in helping you recover. Try to spend more time connecting with trusted friends and family members. Consider going to couples counseling if you have a partner, taking family education classes, or going to family therapy. Therapy can help you and others better understand your condition. How to recognize changes in your anxiety Everyone responds differently to treatment for anxiety. Recovery from anxiety happens when symptoms decrease and stop interfering with your daily activities at home or work. This may mean that you will start to: Have better concentration and focus. Worry will interfere less in your daily thinking. Sleep better. Be less irritable. Have more energy. Have improved memory. It is also important to recognize when your  condition is getting worse. Contact your health care provider if your symptoms interfere with home or work and you feel like your condition is not improving. Follow these instructions at home: Activity Exercise. Adults should do the following: Exercise for at least 150 minutes each week. The exercise should increase your heart rate and make you sweat (moderate-intensity exercise). Strengthening exercises at least twice a week. Get the right amount and quality of sleep. Most adults need 7-9 hours of sleep each night. Lifestyle  Eat a healthy diet that includes plenty of vegetables, fruits, whole grains, low-fat dairy products, and lean protein. Do not eat a lot of foods that are high in fats, added sugars, or salt (sodium). Make choices that simplify your life. Do not use any products that contain nicotine or tobacco. These products include cigarettes, chewing tobacco, and vaping devices, such as e-cigarettes. If you need help quitting, ask your health care provider. Avoid caffeine, alcohol, and certain over-the-counter cold medicines. These may make you feel worse. Ask your pharmacist which medicines to avoid. General instructions Take over-the-counter and prescription medicines only as told by your health care provider. Keep all follow-up visits. This is important. Where to find support You can get help and support from these sources: Self-help groups. Online and [employer]. A trusted spiritual leader. Couples counseling. Family education classes. Family therapy. Where to find more information You may find that joining a support group helps you deal with your anxiety. The following sources can  help you locate counselors or support groups near you: [place]: www.mentalhealthamerica.net Anxiety and Depression Association of [place] (ADAA): https://www.clark.net/ National Alliance on Mental Illness (NAMI): www.nami.org Contact a health care provider if: You have a hard  time staying focused or finishing daily tasks. You spend many hours a day feeling worried about everyday life. You become exhausted by worry. You start to have headaches or frequently feel tense. You develop chronic nausea or diarrhea. Get help right away if: You have a racing heart and shortness of breath. You have thoughts of hurting yourself or others. If you ever feel like you may hurt yourself or others, or have thoughts about taking your own life, get help right away. Go to your nearest emergency department or: Call your local emergency services (911 in the [place] Call a suicide crisis helpline, such as the [place] at 610-455-7299 or 988 in the [place]. This is open 24 hours a day in the [place] Text the Crisis Text Line at 347-248-0433 (in the [place].). Summary Taking steps to learn and use tension reduction techniques can help calm you and help prevent triggering an anxiety reaction. When used together, medicines, psychotherapy, and tension reduction techniques may be the most effective treatment. Family, friends, and partners can play a big part in supporting you. This information is not intended to replace advice given to you by your health care provider. Make sure you discuss any questions you have with your health care provider. Document Revised: 07/07/2021 Document Reviewed: 04/04/2021 Elsevier Patient Education  [place].  Low-Sodium Eating Plan Sodium, which is an element that makes up salt, helps you maintain a healthy balance of fluids in your body. Too much sodium can increase your blood pressure and cause fluid and waste to be held in your body. Your health care provider or dietitian may recommend following this plan if you have high blood pressure (hypertension), kidney disease, liver disease, or heart failure. Eating less sodium can help lower your blood pressure, reduce swelling, and protect your heart, liver, and kidneys. What are tips for  following this plan? Reading food labels The Nutrition Facts label lists the amount of sodium in one serving of the food. If you eat more than one serving, you must multiply the listed amount of sodium by the number of servings. Choose foods with less than 140 mg of sodium per serving. Avoid foods with 300 mg of sodium or more per serving. Shopping  Look for lower-sodium products, often labeled as "low-sodium" or "no salt added." Always check the sodium content, even if foods are labeled as "unsalted" or "no salt added." Buy fresh foods. Avoid canned foods and pre-made or frozen meals. Avoid canned, cured, or processed meats. Buy breads that have less than 80 mg of sodium per slice. Cooking  Eat more home-cooked food and less restaurant, buffet, and fast food. Avoid adding salt when cooking. Use salt-free seasonings or herbs instead of table salt or sea salt. Check with your health care provider or pharmacist before using salt substitutes. Cook with plant-based oils, such as canola, sunflower, or olive oil. Meal planning When eating at a restaurant, ask that your food be prepared with less salt or no salt, if possible. Avoid dishes labeled as brined, pickled, cured, smoked, or made with soy sauce, miso, or teriyaki sauce. Avoid foods that contain MSG (monosodium glutamate). MSG is sometimes added to [place] food, bouillon, and some canned foods. Make meals that can be grilled, baked, poached, roasted, or  steamed. These are generally made with less sodium. General information Most people on this plan should limit their sodium intake to 1,500-2,000 mg (milligrams) of sodium each day. What foods should I eat? Fruits Fresh, frozen, or canned fruit. Fruit juice. Vegetables Fresh or frozen vegetables. "No salt added" canned vegetables. "No salt added" tomato sauce and paste. Low-sodium or reduced-sodium tomato and vegetable juice. Grains Low-sodium cereals, including oats, puffed wheat and  rice, and shredded wheat. Low-sodium crackers. Unsalted rice. Unsalted pasta. Low-sodium bread. Whole-grain breads and whole-grain pasta. Meats and other proteins Fresh or frozen (no salt added) meat, poultry, seafood, and fish. Low-sodium canned tuna and salmon. Unsalted nuts. Dried peas, beans, and lentils without added salt. Unsalted canned beans. Eggs. Unsalted nut butters. Dairy Milk. Soy milk. Cheese that is naturally low in sodium, such as ricotta cheese, fresh mozzarella, or Swiss cheese. Low-sodium or reduced-sodium cheese. Cream cheese. Yogurt. Seasonings and condiments Fresh and dried herbs and spices. Salt-free seasonings. Low-sodium mustard and ketchup. Sodium-free salad dressing. Sodium-free light mayonnaise. Fresh or refrigerated horseradish. Lemon juice. Vinegar. Other foods Homemade, reduced-sodium, or low-sodium soups. Unsalted popcorn and pretzels. Low-salt or salt-free chips. The items listed above may not be a complete list of foods and beverages you can eat. Contact a dietitian for more information. What foods should I avoid? Vegetables Sauerkraut, pickled vegetables, and relishes. Olives. [place] fries. Onion rings. Regular canned vegetables (not low-sodium or reduced-sodium). Regular canned tomato sauce and paste (not low-sodium or reduced-sodium). Regular tomato and vegetable juice (not low-sodium or reduced-sodium). Frozen vegetables in sauces. Grains Instant hot cereals. Bread stuffing, pancake, and biscuit mixes. Croutons. Seasoned rice or pasta mixes. Noodle soup cups. Boxed or frozen macaroni and cheese. Regular salted crackers. Self-rising flour. Meats and other proteins Meat or fish that is salted, canned, smoked, spiced, or pickled. Precooked or cured meat, such as sausages or meat loaves. Berniece Salines. Ham. Pepperoni. Hot dogs. Corned beef. Chipped beef. Salt pork. Jerky. Pickled herring. Anchovies and sardines. Regular canned tuna. Salted nuts. Dairy Processed cheese and  cheese spreads. Hard cheeses. Cheese curds. Blue cheese. Feta cheese. String cheese. Regular cottage cheese. Buttermilk. Canned milk. Fats and oils Salted butter. Regular margarine. Ghee. Bacon fat. Seasonings and condiments Onion salt, garlic salt, seasoned salt, table salt, and sea salt. Canned and packaged gravies. Worcestershire sauce. Tartar sauce. Barbecue sauce. Teriyaki sauce. Soy sauce, including reduced-sodium. Steak sauce. Fish sauce. Oyster sauce. Cocktail sauce. Horseradish that you find on the shelf. Regular ketchup and mustard. Meat flavorings and tenderizers. Bouillon cubes. Hot sauce. Pre-made or packaged marinades. Pre-made or packaged taco seasonings. Relishes. Regular salad dressings. Salsa. Other foods Salted popcorn and pretzels. Corn chips and puffs. Potato and tortilla chips. Canned or dried soups. Pizza. Frozen entrees and pot pies. The items listed above may not be a complete list of foods and beverages you should avoid. Contact a dietitian for more information. Summary Eating less sodium can help lower your blood pressure, reduce swelling, and protect your heart, liver, and kidneys. Most people on this plan should limit their sodium intake to 1,500-2,000 mg (milligrams) of sodium each day. Canned, boxed, and frozen foods are high in sodium. Restaurant foods, fast foods, and pizza are also very high in sodium. You also get sodium by adding salt to food. Try to cook at home, eat more fresh fruits and vegetables, and eat less fast food and canned, processed, or prepared foods. This information is not intended to replace advice given to you by your health care provider.  Make sure you discuss any questions you have with your health care provider. Document Revised: 01/17/2020 Document Reviewed: 11/13/2019 Elsevier Patient Education  [place].

## 2022-11-03 ENCOUNTER — Ambulatory Visit: Payer: No Typology Code available for payment source | Admitting: Cardiovascular Disease

## 2023-01-04 ENCOUNTER — Other Ambulatory Visit: Payer: Self-pay | Admitting: Cardiovascular Disease

## 2023-01-04 DIAGNOSIS — R9431 Abnormal electrocardiogram [ECG] [EKG]: Secondary | ICD-10-CM

## 2023-01-04 DIAGNOSIS — I209 Angina pectoris, unspecified: Secondary | ICD-10-CM

## 2023-01-04 DIAGNOSIS — I10 Essential (primary) hypertension: Secondary | ICD-10-CM

## 2023-02-27 ENCOUNTER — Other Ambulatory Visit: Payer: Self-pay | Admitting: *Deleted

## 2023-02-27 DIAGNOSIS — I6523 Occlusion and stenosis of bilateral carotid arteries: Secondary | ICD-10-CM

## 2023-03-09 ENCOUNTER — Ambulatory Visit (HOSPITAL_COMMUNITY)
Admission: RE | Admit: 2023-03-09 | Discharge: 2023-03-09 | Disposition: A | Discharge status: Home / Self Care | Payer: No Typology Code available for payment source | Source: Ambulatory Visit | Attending: Vascular Surgery | Admitting: Vascular Surgery

## 2023-03-09 ENCOUNTER — Ambulatory Visit (INDEPENDENT_AMBULATORY_CARE_PROVIDER_SITE_OTHER): Payer: No Typology Code available for payment source | Admitting: Physician Assistant

## 2023-03-09 VITALS — BP 142/81 | HR 75 | Temp 97.7°F | Resp 20 | Ht 70.0 in | Wt 209.3 lb

## 2023-03-09 DIAGNOSIS — I6523 Occlusion and stenosis of bilateral carotid arteries: Secondary | ICD-10-CM

## 2023-03-11 ENCOUNTER — Encounter: Payer: Self-pay | Admitting: Physician Assistant

## 2023-03-11 NOTE — Progress Notes (Signed)
History of Present Illness:  Patient is a 67 y.o. year old male who presents for evaluation of symptomatic carotid stenosis. He is s/p left CEA by Dr. Donnetta Hutching with CABG x 4 by Dr. Kipp Brood on 02/12/2020.    He developed Afib post op.    Pt denies any amaurosis fugax, speech difficulties, weakness, numbness, paralysis or clumsiness or facial droop.   He is medically managed on ASA and Crestor daily.     Past Medical History:  Diagnosis Date   Carotid artery occlusion    Coronary artery disease    Hyperlipidemia    Hypertension     Past Surgical History:  Procedure Laterality Date   CARDIAC CATHETERIZATION     CAROTID ENDARTERECTOMY     CORONARY ARTERY BYPASS GRAFT N/A 02/12/2020   Procedure: CORONARY ARTERY BYPASS GRAFTING (CABG) times four, using left internal mammary artery and right greater saphenous vein harvested endoscopically.;  Surgeon: Lajuana Matte, MD;  Location: [place];  Service: Open Heart Surgery;  Laterality: N/A;   ENDARTERECTOMY Left 02/12/2020   Procedure: ENDARTERECTOMY CAROTID;  Surgeon: Rosetta Posner, MD;  Location: [place];  Service: Vascular;  Laterality: Left;   LEFT HEART CATH AND CORONARY ANGIOGRAPHY N/A 02/11/2020   Procedure: LEFT HEART CATH AND CORONARY ANGIOGRAPHY;  Surgeon: Troy Sine, MD;  Location: [facility];  Service: Cardiovascular;  Laterality: N/A;   TEE WITHOUT CARDIOVERSION N/A 02/12/2020   Procedure: TRANSESOPHAGEAL ECHOCARDIOGRAM (TEE);  Surgeon: Lajuana Matte, MD;  Location: [place];  Service: Open Heart Surgery;  Laterality: N/A;     Social History Social History   Tobacco Use   Smoking status: Never    Passive exposure: Never   Smokeless tobacco: Never  Vaping Use   Vaping Use: Never used  Substance Use Topics   Alcohol use: Yes    Alcohol/week: 3.0 standard drinks of alcohol    Types: 1 Glasses of wine, 1 Cans of beer, 1 Shots of liquor per week   Drug use: Never    Family History Family History   Problem Relation Age of Onset   Heart attack Father    Heart disease Sister    Heart disease Brother    Heart attack Brother    Heart attack Brother    Heart disease Sister     Allergies  Allergies  Allergen Reactions   Beta Adrenergic Blockers Other (See Comments)     Current Outpatient Medications  Medication Sig Dispense Refill   aspirin EC 81 MG tablet Take 81 mg by mouth at bedtime.      ezetimibe (ZETIA) 10 MG tablet TAKE ONE TABLET BY MOUTH DAILY 90 tablet 3   metoprolol succinate (TOPROL-XL) 50 MG 24 hr tablet TAKE 1 AND 1/2 TABLET BY MOUTH DAILY 135 tablet 3   nitroGLYCERIN (NITROSTAT) 0.4 MG SL tablet DISSOLVE 1 TAB UNDER TONGUE FOR CHEST PAIN - IF PAIN REMAINS AFTER 5 MIN, CALL 911 AND REPEAT DOSE. MAX 3 TABS IN 15 MINUTES 25 tablet 11   rosuvastatin (CRESTOR) 40 MG tablet TAKE ONE TABLET BY MOUTH EVERY EVENING 90 tablet 3   No current facility-administered medications for this visit.    ROS:   General:  No weight loss, Fever, chills  HEENT: No recent headaches, no nasal bleeding, no visual changes, no sore throat  Neurologic: No dizziness, blackouts, seizures. No recent symptoms of stroke or mini- stroke. No recent episodes of slurred speech, or temporary blindness.  Cardiac: No  recent episodes of chest pain/pressure, no shortness of breath at rest.  No shortness of breath with exertion.  Denies history of atrial fibrillation or irregular heartbeat  Vascular: No history of rest pain in feet.  No history of claudication.  No history of non-healing ulcer, No history of DVT   Pulmonary: No home oxygen, no productive cough, no hemoptysis,  No asthma or wheezing  Musculoskeletal:  [ ]  Arthritis, [ ]  Low back pain,  [ ]  Joint pain  Hematologic:No history of hypercoagulable state.  No history of easy bleeding.  No history of anemia  Gastrointestinal: No hematochezia or melena,  No gastroesophageal reflux, no trouble swallowing  Urinary: [ ]  chronic Kidney  disease, [ ]  on HD - [ ]  MWF or [ ]  TTHS, [ ]  Burning with urination, [ ]  Frequent urination, [ ]  Difficulty urinating;   Skin: No rashes  Psychological: No history of anxiety,  No history of depression   Physical Examination  Vitals:   03/09/23 1419 03/09/23 1420  BP: (!) 150/92 (!) 142/81  Pulse: 75   Resp: 20   Temp: 97.7 F (36.5 C)   TempSrc: Temporal   SpO2: 97%   Weight: 209 lb 4.8 oz (94.9 kg)   Height: 5\' 10"  (1.778 m)     Body mass index is 30.03 kg/m.  General:  Alert and oriented, no acute distress HEENT: Normal Neck: No bruit or JVD Pulmonary: Clear to auscultation bilaterally Cardiac: Regular Rate and Rhythm without murmur Gastrointestinal: Soft, non-tender, non-distended, no mass, no scars Skin: No rash Extremity Pulses:  2+ radialpulses bilaterally Musculoskeletal: No deformity or edema  Neurologic: Upper and lower extremity motor 5/5 and symmetric  DATA:  Carotid Arterial Duplex Study  Patient Name:  Jesus Francis  Date of Exam:   03/09/2023 Medical Rec #: [id number]           Accession #:    [id number] Date of Birth: 1956-01-26           Patient Gender: M Patient Age:   5 years Exam Location:  [facility] Procedure:      VAS US CAROTID Referring Phys: Harrell Gave DICKSON   --------------------------------------------------------------------------- -----   Indications:       Carotid artery disease and Left CEA with concomitant CABG on                    02/12/20. Comparison Study:  No change since prior exam of 12/06/2021  Performing Technologist: Alvia Grove RVT    Examination Guidelines: A complete evaluation includes B-mode imaging, spectral Doppler, color Doppler, and power Doppler as needed of all accessible portions of each vessel. Bilateral testing is considered an integral part of a complete examination. Limited examinations for reoccurring indications may be performed as noted.    Right Carotid  Findings: +----------+--------+--------+--------+-------------------------+--------+           PSV cm/sEDV cm/sStenosisPlaque Description       Comments +----------+--------+--------+--------+-------------------------+--------+ CCA Prox  104     17                                                +----------+--------+--------+--------+-------------------------+--------+ CCA Mid   122     21              heterogenous                      +----------+--------+--------+--------+-------------------------+--------+  CCA Distal88      18              calcific                          +----------+--------+--------+--------+-------------------------+--------+ ICA Prox  189     59      40-59%  heterogenous and calcific         +----------+--------+--------+--------+-------------------------+--------+ ICA Mid   103     33                                                +----------+--------+--------+--------+-------------------------+--------+ ICA Distal63      21                                                +----------+--------+--------+--------+-------------------------+--------+ ECA       104     4                                                 +----------+--------+--------+--------+-------------------------+--------+  +----------+--------+-------+----------------+-------------------+           PSV cm/sEDV cmsDescribe        Arm Pressure (mmHG) +----------+--------+-------+----------------+-------------------+ Subclavian200     4      Multiphasic, WNL                    +----------+--------+-------+----------------+-------------------+  +---------+--------+--+--------+-+---------+ VertebralPSV cm/s40EDV cm/s7Antegrade +---------+--------+--+--------+-+---------+     Left Carotid Findings: +----------+--------+--------+--------+------------------+--------+           PSV cm/sEDV cm/sStenosisPlaque  DescriptionComments +----------+--------+--------+--------+------------------+--------+ CCA Prox  122     21                                         +----------+--------+--------+--------+------------------+--------+ CCA Mid   111     24                                         +----------+--------+--------+--------+------------------+--------+ CCA Distal105     24                                         +----------+--------+--------+--------+------------------+--------+ ICA Prox  103     36      1-39%                              +----------+--------+--------+--------+------------------+--------+ ICA Mid   79      29                                         +----------+--------+--------+--------+------------------+--------+ ICA Distal79      28                                         +----------+--------+--------+--------+------------------+--------+  ECA       77      9                                          +----------+--------+--------+--------+------------------+--------+  +----------+--------+--------+----------------+-------------------+           PSV cm/sEDV cm/sDescribe        Arm Pressure (mmHG) +----------+--------+--------+----------------+-------------------+ [id number]     5       Multiphasic, [id number]                 +----------+--------+--------+----------------+-------------------+  +---------+--------+--+--------+--+---------+ VertebralPSV cm/s45EDV cm/s14Antegrade +---------+--------+--+--------+--+---------+    Summary: Right Carotid: Velocities in the right ICA are consistent with a 40-59%                stenosis.  Left Carotid: Velocities in the left ICA are consistent with a 1-39% stenosis,               patent CE site.  Vertebrals:  Bilateral vertebral arteries demonstrate antegrade flow. Subclavians: Normal flow hemodynamics were seen in bilateral subclavian              arteries.      ASSESSMENT/PLAN:  Symptomatic left brain TIA s/p left CEA by Dr. Donnetta Hutching in combination with CAGB by Dr. Kipp Brood.  He has done very well and has no complaints or symptoms of recurrent stroke/TIA.  He is very pleased with his outcome.   -pt will f/u in one year with carotid duplex -pt will call sooner should they have any issues. -continue statin/asa     Roxy Horseman PA-C [facility] Office: [phone]  MD on call Virl Cagey

## 2023-04-04 ENCOUNTER — Other Ambulatory Visit: Payer: Self-pay | Admitting: Cardiovascular Disease

## 2023-04-04 DIAGNOSIS — I10 Essential (primary) hypertension: Secondary | ICD-10-CM

## 2023-12-30 ENCOUNTER — Other Ambulatory Visit: Payer: Self-pay | Admitting: Cardiovascular Disease

## 2023-12-30 DIAGNOSIS — I209 Angina pectoris, unspecified: Secondary | ICD-10-CM

## 2023-12-30 DIAGNOSIS — R9431 Abnormal electrocardiogram [ECG] [EKG]: Secondary | ICD-10-CM

## 2023-12-30 DIAGNOSIS — I10 Essential (primary) hypertension: Secondary | ICD-10-CM

## 2024-01-29 ENCOUNTER — Other Ambulatory Visit: Payer: Self-pay

## 2024-01-29 DIAGNOSIS — R9431 Abnormal electrocardiogram [ECG] [EKG]: Secondary | ICD-10-CM

## 2024-01-29 DIAGNOSIS — I10 Essential (primary) hypertension: Secondary | ICD-10-CM

## 2024-01-29 DIAGNOSIS — I209 Angina pectoris, unspecified: Secondary | ICD-10-CM

## 2024-01-29 MED ORDER — EZETIMIBE 10 MG PO TABS: 10.0000 mg | ORAL_TABLET | Freq: Every day | ORAL | 1 refills | Status: DC

## 2024-01-29 MED ORDER — METOPROLOL SUCCINATE ER 50 MG PO TB24: 75.0000 mg | ORAL_TABLET | Freq: Every day | ORAL | 1 refills | Status: DC

## 2024-01-29 MED ORDER — ROSUVASTATIN CALCIUM 40 MG PO TABS: 40.0000 mg | ORAL_TABLET | Freq: Every evening | ORAL | 1 refills | Status: DC

## 2024-03-15 ENCOUNTER — Encounter (HOSPITAL_COMMUNITY): Payer: No Typology Code available for payment source

## 2024-03-15 ENCOUNTER — Ambulatory Visit: Payer: No Typology Code available for payment source

## 2024-03-28 ENCOUNTER — Encounter: Payer: Self-pay | Admitting: Cardiovascular Disease

## 2024-03-28 ENCOUNTER — Ambulatory Visit
Payer: No Typology Code available for payment source | Attending: Cardiovascular Disease | Admitting: Cardiovascular Disease

## 2024-03-28 VITALS — BP 140/92 | HR 82 | Ht 69.0 in | Wt 203.2 lb

## 2024-03-28 DIAGNOSIS — E785 Hyperlipidemia, unspecified: Secondary | ICD-10-CM

## 2024-03-28 DIAGNOSIS — I10 Essential (primary) hypertension: Secondary | ICD-10-CM

## 2024-03-28 DIAGNOSIS — Z9889 Other specified postprocedural states: Secondary | ICD-10-CM

## 2024-03-28 DIAGNOSIS — Z951 Presence of aortocoronary bypass graft: Secondary | ICD-10-CM

## 2024-03-28 DIAGNOSIS — R011 Cardiac murmur, unspecified: Secondary | ICD-10-CM

## 2024-03-28 DIAGNOSIS — I2510 Atherosclerotic heart disease of native coronary artery without angina pectoris: Secondary | ICD-10-CM | POA: Diagnosis not present

## 2024-03-28 LAB — CBC

## 2024-03-28 NOTE — Progress Notes (Signed)
 Cardiology Office Note    Date:  03/31/2024   ID:  Jesus Francis, DOB May 28, 1956, MRN [id number]  PCP: Dr. Susa Raring Cardiologist:  Nicki Guadalajara, MD   25 month F/U cardiology evaluation initially sent through the courtesy of Dr. Susa Raring for evaluation of exertional chest discomfort.  History of Present Illness:  Jesus Francis is a 68 y.o. male who was initially referred for through the courtesy of Dr. Susa Raring for exertional chest pressure.  I last saw him in March 2023.  He presents for a 78-month follow-up evaluation.    Jesus Francis has a history of hypertension as well as hyperlipidemia.  In November 2018, LDL cholesterol was 264 and apparently was against statin therapy.  He states that over 10 years ago he did experience some chest discomfort and apparently had a stress test.  He was told that this was normal but never saw a cardiologist.  The patient has issues with bone spurs involving his cervical and thoracic spine.  He states that around Thanksgiving he became upset and apparently passed out for short duration.  He did note chest pressure.  Subsequently, he has noted some exertional chest pressure with activity.  Remotely he used to work out hard but admits that over the past 2 years he really has not exercised to any capacity.  He does admit to being under increased stress and that his wife died 5 years ago at age 36.  His youngest daughter also had brain cancer.  He was recently evaluated by Dr. Susa Raring on December 25, 2019.  His ECG raise concern for prior heart attack.  There was no ECG to compare to laboratory showed a total cholesterol of 318, HDL 45, LDL 240, triglycerides 170.  Hemoglobin A1c was 6.1.  Creatinine 1.02.  LFTs were normal.  During his evaluation, his blood pressure was elevated at 172/102 and he was started on valsartan 160 mg he also was started on rosuvastatin 40 mg in light of his significant hyperlipidemia.    When I saw him for initial  evaluation on January 02, 2020, with his exertional change symptomatology I recommended initiation of metoprolol succinate 25 mg which would also assist in his blood pressure control.  I recommended an echo Doppler study as well as coronary CTA evaluation with possible FFR.  If LDL continues to be elevated on maximal therapy I discussed potential future PCSK9 inhibition.  His echo Doppler study on January 08, 2020 demonstrated normal systolic function with EF 60 to 65%.  There was mildly increased left ventricular posterior wall thickness without LVH.  Coronary CTA was significant abnormal with a coronary calcium score 2195, 98th percentile for age and sex matched control.  He was found to have severe calcified plaque in the proximal RCA and proximal to mid LAD.  Subsequent FFR analysis suggested hemodynamic significance with mid LAD FFR less than 0.5 and circumflex FFR 0.77.  The proximal RCA FFR was 0.74.  He was felt after occlusion of the mid RCA.  Jesus Francis has felt improved with the addition of Toprol added to his valsartan.  I saw him as an add-on on February 04 2019 in follow-up of his severely abnormal coronary CTA.    He underwent cardiac catheterization on February 11 2020 which revealed severe coronary calcification with multivessel CAD.  There was 30% distal left main narrowing prior to its trifurcation into the LAD, ramus and circumflex vessel.  The LAD had 95% near ostial stenosis followed  by diffuse 60 to 70% proximal to mid calcified stenoses with diffuse 70% stenosis of the diagonal vessel. Ramus intermediate had 50% proximal stenosis the circumflex 60% ostial OM1 stenosis and the large dominant RCA had severe 90 to 99% calcified proximal to mid stenoses.  Preoperative screening revealed high-grade left carotid stenosis.  He underwent successful CABG revascularization surgery the following day and had a LIMA placed to his LAD, SVG to PDA, I last saw him SVG to OM1, and SVG to diagonal and  left carotid endarterectomy by Dr. Arbie Cookey.  He developed postoperative atrial fibrillation and was treated with IV amiodarone with conversion to sinus rhythm.    He subsequent was seen in our office by Judy Pimple, PA-C on February 27 2020.  I saw him for initial post hospital evaluation with me on Apr 29, 2020.  At that time he was participating in cardiac rehab phase 2.  He had not had any recurrent atrial fibrillation.  At cardiac rehab his blood pressure was stable typically running in the 110 -120 range systolically.  He did not have any recurrent atrial fibrillation.  During that evaluation I recommended reduction of amiodarone to 100 mg for 2 weeks and then discontinuance and increased Toprol  succinate to 75 mg daily.  He underwent a follow-up echo Doppler study on June 09, 2020 which showed an EF of 55 to 60% without wall motion abnormalities.  There was grade 1 diastolic dysfunction.  I saw him in August 2021.  He completed the cardiac rehab program.  He states his blood pressure at home typically runs in the 110-120 range and this was consistent at cardiac rehab.  However he does have "white coat "shoes and blood pressure always is elevated in a doctor's office.  Last week, he was bitten by a cat and was treated with Augmentin.  He denied chest pain PND orthopnea.  He denied any awareness of heart rate irregularity.  He felt the best he has felt in years.    I saw him on February 04, 2021.  He was married on September 26, 2020.  He continued to remain stable and specifically denied any recurrent anginal symptomatology or exertional dyspnea.  He continues to be on metoprolol succinate 75 mg daily in addition to Zetia and rosuvastatin 40 mg and aspirin.   He is status post left carotid endarterectomy and had undergone follow-up carotid imaging in December 2021 which remained stable with 40 to 59% right ICA stenosis and left carotid at 1 to 39%.  I last saw him on March 03, 2022 at which time he was  remaining stable and was continuing to work.  His blood pressure at home was running around 120 systolically.  However it is always elevated when he comes to the doctor's office.  He remains active.  He denies chest pain or shortness of breath.  He recently underwent a 1 year follow-up carotid Doppler evaluation and this was essentially unchanged from his 2021 assessment.  He has not had recent laboratory.  He presents for 96-month follow-up evaluation.  Since I last saw him, he was seen by Sharlene Dory, NP on July 20, 2022.  He was experiencing some stress mediated palpitations.  She had discussed a possible Zio patch monitor but he declined.  He underwent follow-up carotid Doppler imaging on March 09, 2023 which showed 40 to 59% stenosis in the right carotid and 1 to 39% in the left.  Presently, he feels well.  He is scheduled to undergo  a 1 year follow-up carotid study on Apr 26, 2024.  He denies any recurrent anginal symptoms.  He currently is on Zetia 10 mg and rosuvastatin 40 mg.  He has been taking metoprolol succinate 50 mg at night.  He has not had recent laboratory.  He presents for evaluation.   Past medical history is notable for hypertension, hyperlipidemia, history of kidney stones, history of internal hemorrhoids, remote history of hematuria with negative work-up by urology, history of carpal tunnel syndrome status post bilateral release, previous documentation of aortoiliac atherosclerosis seen on CT imaging in 2019.  Past surgical history is notable for his carpal tunnel release, hemorrhoidal surgery, as well as vocal cord surgery.  Current Medications: Outpatient Medications Prior to Visit  Medication Sig Dispense Refill   aspirin EC 81 MG tablet Take 81 mg by mouth at bedtime.      ezetimibe (ZETIA) 10 MG tablet Take 1 tablet (10 mg total) by mouth daily. 30 tablet 1   metoprolol succinate (TOPROL-XL) 50 MG 24 hr tablet Take 1.5 tablets (75 mg total) by mouth daily. 45 tablet 1    nitroGLYCERIN (NITROSTAT) 0.4 MG SL tablet DISSOLVE 1 TAB UNDER TONGUE FOR CHEST PAIN - IF PAIN REMAINS AFTER 5 MIN, CALL 911 AND REPEAT DOSE. MAX 3 TABS IN 15 MINUTES 25 tablet 11   rosuvastatin (CRESTOR) 40 MG tablet Take 1 tablet (40 mg total) by mouth every evening. 30 tablet 1   No facility-administered medications prior to visit.     Allergies:   Beta adrenergic blockers   Social History   Socioeconomic History   Marital status: Married    Spouse name: Not on file   Number of children: 2   Years of education: 16   Highest education level: Bachelor's degree (e.g., BA, AB, BS)  Occupational History   Not on file  Tobacco Use   Smoking status: Never    Passive exposure: Never   Smokeless tobacco: Never  Vaping Use   Vaping status: Never Used  Substance and Sexual Activity   Alcohol use: Yes    Alcohol/week: 3.0 standard drinks of alcohol    Types: 1 Glasses of wine, 1 Cans of beer, 1 Shots of liquor per week   Drug use: Never   Sexual activity: Not on file  Other Topics Concern   Not on file  Social History Narrative   Not on file   Social Drivers of Health   Financial Resource Strain: Not on file  Food Insecurity: Not on file  Transportation Needs: Not on file  Physical Activity: Not on file  Stress: Not on file  Social Connections: Not on file     Social history is notable in that he was widowed for 5 years when his wife died at age 45.  He has 2 children ages 83 and 61 and his youngest daughter had a previous brain tumor.  He graduated from [employer].  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  He does not smoke cigarettes.  There is a rare cigar use.  He drinks an occasional beer wine scotch or bourbon.  He was remarried on September 26, 2020  Family History:  The patient's family history includes Heart attack in his brother, brother, and father; Heart disease in his brother, sister, and sister.   His mother died at age 31.  His father died at age 69 but had a  heart attack at age 38.  There are 6 siblings.  Brother age 50  who is alive and well, brother age 58 has a pacemaker and defibrillator, a brother died at age 90 with a heart attack and another brother died at age 24 with a heart attack.  One sister age 5 had a stent at age 67 and another sister at age 13 had bypass surgery at age 24.  ROS General: Negative; No fevers, chills, or night sweats;  HEENT: Negative; No changes in vision or hearing, sinus congestion, difficulty swallowing Pulmonary: Negative; No cough, wheezing, shortness of breath, hemoptysis Cardiovascular: See HPI GI: Negative; No nausea, vomiting, diarrhea, or abdominal pain GU: Negative; No dysuria, hematuria, or difficulty voiding Musculoskeletal: Positive for left radicular pain Hematologic/Oncology: Negative; no easy bruising, bleeding Endocrine: Negative; no heat/cold intolerance; no diabetes Neuro: Negative; no changes in balance, headaches Skin: Negative; No rashes or skin lesions Psychiatric: Negative; No behavioral problems, depression Sleep: Negative; No snoring, daytime sleepiness, hypersomnolence, bruxism, restless legs, hypnogognic hallucinations, no cataplexy Other comprehensive 14 point system review is negative.   PHYSICAL EXAM:   VS:  BP (!) 140/92   Pulse 82   Ht 5\' 9"  (1.753 m)   Wt 203 lb 3.2 oz (92.2 kg)   SpO2 99%   BMI 30.01 kg/m     Repeat blood pressure by me was 132/82  Wt Readings from Last 3 Encounters:  03/28/24 203 lb 3.2 oz (92.2 kg)  03/09/23 209 lb 4.8 oz (94.9 kg)  07/20/22 210 lb 6.4 oz (95.4 kg)     General: Alert, oriented, no distress.  Skin: normal turgor, no rashes, warm and dry HEENT: Normocephalic, atraumatic. Pupils equal round and reactive to light; sclera anicteric; extraocular muscles intact;  Nose without nasal septal hypertrophy Mouth/Parynx benign; Mallinpatti scale 3 Neck: No JVD, no carotid bruits; normal carotid upstroke Lungs: clear to ausculatation and  percussion; no wheezing or rales Chest wall: without tenderness to palpitation Heart: PMI not displaced, RRR, s1 s2 normal, 2/6 systolic murmur in aortic area, no diastolic murmur, no rubs, gallops, thrills, or heaves Abdomen: soft, nontender; no hepatosplenomehaly, BS+; abdominal aorta nontender and not dilated by palpation. Back: no CVA tenderness Pulses 2+ Musculoskeletal: full range of motion, normal strength, no joint deformities Extremities: no clubbing cyanosis or edema, Homan's sign negative  Neurologic: grossly nonfocal; Cranial nerves grossly wnl Psychologic: Normal mood and affect    Studies/Labs Reviewed:   EKG Interpretation Date/Time:  Thursday March 28 2024 09:35:58 EDT Ventricular Rate:  82 PR Interval:  162 QRS Duration:  96 QT Interval:  364 QTC Calculation: 425 R Axis:   76  Text Interpretation: Normal sinus rhythm Normal ECG When compared with ECG of 13-Feb-2020 23:43, Sinus rhythm has replaced Atrial fibrillation Criteria for Inferior-posterior infarct are no longer Present Nonspecific T wave abnormality has replaced inverted T waves in Inferior leads Confirmed by Nicki Guadalajara ([id number]) on 03/31/2024 6:19:05 PM    March 03, 2022 ECG (independently read by me): Sinus rhythm at 71; No ectopy, normal intervals  February 04, 2021 ECG (independently read by me):  NSR at 77, Small Q wave inferiorly, no ectopy, normal intervals  August 04, 2020 ECG (independently read by me): Normal sinus rhythm at 68 bpm.  Nondiagnostic T wave abnormality.  Normal intervals.  No ectopy.  Apr 29, 2020 ECG (independently read by me): Normal sinus rhythm at 68 bpm.  Small inferior Q waves and early transition.  February 05, 2020 ECG (independently read by me): Normal sinus rhythm at 85 bpm with mild sinus arrhythmia, small inferior Q waves  with possible inferior infarct.  QTc interval 428 ms..  Number 150 ms  January 02, 2020 ECG (independently read by me): Normal sinus rhythm at 81 bpm.   Small inferior Q waves in leads III and aVF suggestive of  age-indeterminate inferior infarct.  Early transition.  Normal intervals.  No ectopy  Recent Labs:    Latest Ref Rng & Units 03/28/2024   11:20 AM 03/14/2022    8:11 AM 02/04/2021    9:49 AM  BMP  Glucose 70 - 99 mg/dL 595  638  756   BUN 8 - 27 mg/dL 13  13  16    Creatinine 0.76 - 1.27 mg/dL 4.33  2.95  1.88   BUN/Creat Ratio 10 - 24 12  11  16    Sodium 134 - 144 mmol/L 136  138  140   Potassium 3.5 - 5.2 mmol/L 5.1  5.1  5.3   Chloride 96 - 106 mmol/L 98  102  102   CO2 20 - 29 mmol/L 24  24  24    Calcium 8.6 - 10.2 mg/dL 9.8  9.3  9.6         Latest Ref Rng & Units 03/28/2024   11:20 AM 03/14/2022    8:11 AM 02/04/2021    9:49 AM  Hepatic Function  Total Protein 6.0 - 8.5 g/dL 7.0  6.8  7.4   Albumin 3.9 - 4.9 g/dL 4.4  4.3  4.4   AST 0 - 40 IU/L 25  26  27    ALT 0 - 44 IU/L 27  24  27    Alk Phosphatase 44 - 121 IU/L 72  72  67   Total Bilirubin 0.0 - 1.2 mg/dL 0.4  0.4  0.5        Latest Ref Rng & Units 03/28/2024   11:20 AM 03/14/2022    8:11 AM 02/04/2021    9:49 AM  CBC  WBC 3.4 - 10.8 x10E3/uL 10.4  8.7  10.8   Hemoglobin 13.0 - 17.7 g/dL 41.6  60.6  30.1   Hematocrit 37.5 - 51.0 % 47.8  43.0  46.4   Platelets 150 - 450 x10E3/uL 236  215  228    Lab Results  Component Value Date   MCV 93 03/28/2024   MCV 92 03/14/2022   MCV 92 02/04/2021   Lab Results  Component Value Date   TSH 1.980 03/28/2024   Lab Results  Component Value Date   HGBA1C 10.7 (H) 03/28/2024     BNP No results found for: "BNP"  ProBNP No results found for: "PROBNP"   Lipid Panel     Component Value Date/Time   CHOL 120 03/28/2024 1120   TRIG 91 03/28/2024 1120   HDL 39 (L) 03/28/2024 1120   CHOLHDL 3.1 03/28/2024 1120   LDLCALC 63 03/28/2024 1120   LABVLDL 18 03/28/2024 1120     RADIOLOGY: No results found.   Additional studies/ records that were reviewed today include:  I reviewed the records from Dr. Susa Raring  Laboratory from December 25, 2019: Total cholesterol 318, HDL 45, LDL 240, triglycerides 170 HDL 6.1, consistent with prediabetes Creatinine 1.02.  Echo Doppler study, coronary CTA and FFR analysis were reviewed and thoroughly discussed with the patient.  February 11, 2020 Ramus lesion is 50% stenosed. Dist LM to Ost LAD lesion is 30% stenosed. 1st Mrg lesion is 60% stenosed. Prox LAD lesion is 90% stenosed. Prox LAD to Mid LAD lesion is 60% stenosed. Mid LAD  lesion is 70% stenosed. Prox RCA lesion is 99% stenosed. Ost RCA to Prox RCA lesion is 90% stenosed. Mid RCA lesion is 50% stenosed. Dist RCA lesion is 20% stenosed.   Severe coronary calcification with multivessel CAD.  There is 30% distal narrowing of the left main coronary artery prior to its trifurcation into the LAD, ramus intermediate, and left circumflex vessel. The LAD has 90% very proximal (near ostial) stenosis followed by diffuse 60 and 70% proximal to mid calcified stenoses with diffuse 70% stenosis in the diagonal vessel; 50% very proximal ramus intermediate stenosis; 60% ostial OM1 stenosis and large dominant RCA with 90% proximal followed by 99% calcified proximal to mid stenosis with 50% mid stenosis and mild 20% areas of luminal irregularity.   LVEDP 16 mmHg.   RECOMMENDATION: Surgical consultation for CABG surgery.  The patient will be admitted to the hospital.  He will be hydrated post procedure, and started on heparin therapy.  Aggressive lipid-lowering therapy will be necessary with potential need for PCSK9 inhibition.      ASSESSMENT:    1. CAD in native artery   2. S/P CABG x 4   3. History of CEA (carotid endarterectomy)   4. Essential hypertension   5. Hyperlipidemia with target LDL less than 70   6. Systolic murmur     PLAN:  Mr. Elbert Spickler is a 79 -year-old gentleman who has a history of hypertension, significant hyperlipidemia as well as family history for heart disease.  The  patient has had markedly elevated LDL levels in the past suggesting a high likelihood for familial hyperlipidemia.  A lipid panel in December 2020 has shown a total cholesterol of 318 with an LDL cholesterol of 240.  The patient  had experienced an episode where he passed out being and subsequently has noticed some exertional chest pressure.  When I saw him for initial evaluation his ECG showed evidence for inferior Q waves with early transition suggesting the possibility that he may have had an inferior posterior MI in the past.  He developed stage II hypertension and was started on valsartan 160 mg daily.  At his initial evaluation with me I reviewed new hypertensive guidelines and with his chest discomfort and initially low-dose metoprolol succinate at 25 mg daily.  He was found to have a markedly abnormal coronary CTA and subsequent cardiac catheterization demonstrated severe multivessel CAD.  Preoperative screening revealed critical left carotid stenosis and on further discussion it appeared that he had suffered a TIA several months prior to presentation.  He underwent successful CABG surgery x4 the following day by Dr. Cliffton Asters and left carotid endarterectomy by Dr. Arbie Cookey.  His postoperative course was complicated by atrial fibrillation for which he was started on amiodarone therapy.  He successfully completed cardiac rehab.  Since I saw him slightly over 2 years ago, he has continued to be relatively stable from a cardiac standpoint.  He has undergone follow-up carotid duplex imaging with his last study done in March 2024 demonstrating 40 to 59% stenosis in the right carotid and 1 at 39% in the left.  He remains asymptomatic and is not having anginal symptoms.  He is scheduled for a follow-up carotid study in May 2025.  That day, I will also recommend he undergo a follow-up echo Doppler study for reassessment of LV function.  He has had some mild blood pressure elevation and on repeat by me today was 132/82.   He does have a 2/6 systolic murmur in the aortic region.  His resting pulse is 82.  I have suggested that he change his metoprolol dosing and take 50 mg in the morning.  I discussed with him my upcoming retirement in several months.  I will transition him to the care of Dr. Tonny Bollman who will also follow his aortic murmur.  I am recommending repeat laboratory be obtained in the fasting state with a comprehensive metabolic panel, CBC, TSH, hemoglobin A1c, fasting lipid panel, and will also check LP(a).  He may require PCSK9 inhibition with Repatha if not at target.  I will transition him to Dr. Excell Seltzer to be seen initially in September 2025 or sooner as needed.     Medication Adjustments/Labs and Tests Ordered: Current medicines are reviewed at length with the patient today.  Concerns regarding medicines are outlined above.  Medication changes, Labs and Tests ordered today are listed in the Patient Instructions below. Patient Instructions  Medication Instructions:  Take medication as directed. *If you need a refill on your cardiac medications before your next appointment, please call your pharmacy*   Lab Work: Labs will be drawn today. If you have labs (blood work) drawn today and your tests are completely normal, you will receive your results only by: MyChart Message (if you have MyChart) OR A paper copy in the mail If you have any lab test that is abnormal or we need to change your treatment, we will call you to review the results.   Testing/Procedures: Your physician has requested that you have an echocardiogram on Apr 26, 2024 if possible. Echocardiography is a painless test that uses sound waves to create images of your heart. It provides your doctor with information about the size and shape of your heart and how well your heart's chambers and valves are working. This procedure takes approximately one hour. There are no restrictions for this procedure. Please do NOT wear cologne,  perfume, aftershave, or lotions (deodorant is allowed). Please arrive 15 minutes prior to your appointment time.  Please note: We ask at that you not bring children with you during ultrasound (echo/ vascular) testing. Due to room size and safety concerns, children are not allowed in the ultrasound rooms during exams. Our front office staff cannot provide observation of children in our lobby area while testing is being conducted. An adult accompanying a patient to their appointment will only be allowed in the ultrasound room at the discretion of the ultrasound technician under special circumstances. We apologize for any inconvenience.    Follow-Up: At [facility], you and your health needs are our priority.  As part of our continuing mission to provide you with exceptional heart care, we have created designated Provider Care Teams.  These Care Teams include your primary Cardiologist (physician) and Advanced Practice Providers (APPs -  Physician Assistants and Nurse Practitioners) who all work together to provide you with the care you need, when you need it.  We recommend signing up for the patient portal called "MyChart".  Sign up information is provided on this After Visit Summary.  MyChart is used to connect with patients for Virtual Visits (Telemedicine).  Patients are able to view lab/test results, encounter notes, upcoming appointments, etc.  Non-urgent messages can be sent to your provider as well.   To learn more about what you can do with MyChart, go to [email or web address].    Your next appointment:   5 month(s)  Provider:   Dr. Tonny Bollman     Other Instructions [facility]  [address]  [address] [place] [address] OPENING APRIL 28,2025       1st Floor: - Lobby - Registration  - Pharmacy  - Lab - Cafe   2nd Floor: - PV Lab - Diagnostic Testing (echo, CT, nuclear med)   3rd Floor: - Vacant   4th Floor: - TCTS (cardiothoracic  surgery) - AFib Clinic - Structural Heart Clinic - Vascular Surgery  - Vascular Ultrasound   5th Floor: - [facility]Care Cardiology (general and EP) - Clinical Pharmacy for coumadin, hypertension, lipid, weight-loss medications, and med management appointments      Valet parking services will be available as well.       Thank you for choosing [facility]!       Signed, Nicki Guadalajara, MD  03/31/2024 6:28 PM    [facility] [address], [place], [place]  [address] Phone: [phone]  He had discontinued this nature recommended with the spironolactone and that I would get that maybe about a week or 2 his digestive follow-up make sure the potassium does not allow

## 2024-03-28 NOTE — Patient Instructions (Signed)
 Medication Instructions:  Take medication as directed. *If you need a refill on your cardiac medications before your next appointment, please call your pharmacy*   Lab Work: Labs will be drawn today. If you have labs (blood work) drawn today and your tests are completely normal, you will receive your results only by: MyChart Message (if you have MyChart) OR A paper copy in the mail If you have any lab test that is abnormal or we need to change your treatment, we will call you to review the results.   Testing/Procedures: Your physician has requested that you have an echocardiogram on Apr 26, 2024 if possible. Echocardiography is a painless test that uses sound waves to create images of your heart. It provides your doctor with information about the size and shape of your heart and how well your heart's chambers and valves are working. This procedure takes approximately one hour. There are no restrictions for this procedure. Please do NOT wear cologne, perfume, aftershave, or lotions (deodorant is allowed). Please arrive 15 minutes prior to your appointment time.  Please note: We ask at that you not bring children with you during ultrasound (echo/ vascular) testing. Due to room size and safety concerns, children are not allowed in the ultrasound rooms during exams. Our front office staff cannot provide observation of children in our lobby area while testing is being conducted. An adult accompanying a patient to their appointment will only be allowed in the ultrasound room at the discretion of the ultrasound technician under special circumstances. We apologize for any inconvenience.    Follow-Up: At [facility], you and your health needs are our priority.  As part of our continuing mission to provide you with exceptional heart care, we have created designated Provider Care Teams.  These Care Teams include your primary Cardiologist (physician) and Advanced Practice Providers (APPs -   Physician Assistants and Nurse Practitioners) who all work together to provide you with the care you need, when you need it.  We recommend signing up for the patient portal called "MyChart".  Sign up information is provided on this After Visit Summary.  MyChart is used to connect with patients for Virtual Visits (Telemedicine).  Patients are able to view lab/test results, encounter notes, upcoming appointments, etc.  Non-urgent messages can be sent to your provider as well.   To learn more about what you can do with MyChart, go to [email or web address].    Your next appointment:   5 month(s)  Provider:   Dr. Tonny Bollman     Other Instructions [facility]  [address] [place], [place] [address] [address] [phone]       1st Floor: - Lobby - Registration  - Pharmacy  - Lab - Cafe   2nd Floor: - PV Lab - Diagnostic Testing (echo, CT, nuclear med)   3rd Floor: - Vacant   4th Floor: - TCTS (cardiothoracic surgery) - AFib Clinic - Structural Heart Clinic - Vascular Surgery  - Vascular Ultrasound   5th Floor: - [facility] (general and EP) - Clinical Pharmacy for coumadin, hypertension, lipid, weight-loss medications, and med management appointments      Valet parking services will be available as well.       Thank you for choosing [facility]!

## 2024-03-29 LAB — CBC
Hematocrit: 47.8 % (ref 37.5–51.0)
Hemoglobin: 16 g/dL (ref 13.0–17.7)
MCH: 31.1 pg (ref 26.6–33.0)
MCHC: 33.5 g/dL (ref 31.5–35.7)
MCV: 93 fL (ref 79–97)
Platelets: 236 10*3/uL (ref 150–450)
RBC: 5.15 x10E6/uL (ref 4.14–5.80)
RDW: 11.9 % (ref 11.6–15.4)
WBC: 10.4 10*3/uL (ref 3.4–10.8)

## 2024-03-29 LAB — COMPREHENSIVE METABOLIC PANEL WITH GFR
ALT: 27 IU/L (ref 0–44)
AST: 25 IU/L (ref 0–40)
Albumin: 4.4 g/dL (ref 3.9–4.9)
Alkaline Phosphatase: 72 IU/L (ref 44–121)
BUN/Creatinine Ratio: 12 (ref 10–24)
BUN: 13 mg/dL (ref 8–27)
Bilirubin Total: 0.4 mg/dL (ref 0.0–1.2)
CO2: 24 mmol/L (ref 20–29)
Calcium: 9.8 mg/dL (ref 8.6–10.2)
Chloride: 98 mmol/L (ref 96–106)
Creatinine, Ser: 1.06 mg/dL (ref 0.76–1.27)
Globulin, Total: 2.6 g/dL (ref 1.5–4.5)
Glucose: 245 mg/dL — ABNORMAL HIGH (ref 70–99)
Potassium: 5.1 mmol/L (ref 3.5–5.2)
Sodium: 136 mmol/L (ref 134–144)
Total Protein: 7 g/dL (ref 6.0–8.5)
eGFR: 76 mL/min/{1.73_m2} (ref >59)

## 2024-03-29 LAB — LIPID PANEL
Chol/HDL Ratio: 3.1 ratio (ref 0.0–5.0)
Cholesterol, Total: 120 mg/dL (ref 100–199)
HDL: 39 mg/dL — ABNORMAL LOW (ref >39)
LDL Chol Calc (NIH): 63 mg/dL (ref 0–99)
Triglycerides: 91 mg/dL (ref 0–149)
VLDL Cholesterol Cal: 18 mg/dL (ref 5–40)

## 2024-03-29 LAB — HEMOGLOBIN A1C
Est. average glucose Bld gHb Est-mCnc: 260 mg/dL
Hgb A1c MFr Bld: 10.7 % — ABNORMAL HIGH (ref 4.8–5.6)

## 2024-03-29 LAB — LIPOPROTEIN A (LPA): Lipoprotein (a): 299.4 nmol/L — ABNORMAL HIGH (ref <75.0)

## 2024-03-29 LAB — TSH: TSH: 1.98 u[IU]/mL (ref 0.450–4.500)

## 2024-03-31 ENCOUNTER — Encounter: Payer: Self-pay | Admitting: Cardiovascular Disease

## 2024-04-03 ENCOUNTER — Other Ambulatory Visit: Payer: Self-pay | Admitting: Cardiovascular Disease

## 2024-04-03 ENCOUNTER — Other Ambulatory Visit: Payer: Self-pay

## 2024-04-03 DIAGNOSIS — R9431 Abnormal electrocardiogram [ECG] [EKG]: Secondary | ICD-10-CM

## 2024-04-03 DIAGNOSIS — I10 Essential (primary) hypertension: Secondary | ICD-10-CM

## 2024-04-03 DIAGNOSIS — I209 Angina pectoris, unspecified: Secondary | ICD-10-CM

## 2024-04-03 MED ORDER — METOPROLOL SUCCINATE ER 50 MG PO TB24: 75.0000 mg | ORAL_TABLET | Freq: Every day | ORAL | 3 refills | Status: DC

## 2024-04-03 MED ORDER — ROSUVASTATIN CALCIUM 40 MG PO TABS: 40.0000 mg | ORAL_TABLET | Freq: Every evening | ORAL | 3 refills | Status: AC

## 2024-04-03 NOTE — Addendum Note (Signed)
 Addended by: Adriana Simas, Elba Schaber L on: 04/03/2024 10:52 AM   Modules accepted: Orders

## 2024-04-18 ENCOUNTER — Telehealth (HOSPITAL_COMMUNITY): Payer: Self-pay | Admitting: Cardiovascular Disease

## 2024-04-18 NOTE — Telephone Encounter (Signed)
 Patient cancelled echocardiogram for the reason below:  04/18/2024 9:49 AM By: MCNEIL, CHLOE R  Cancel Rsn: Patient (pt does not wish to have echo right now)   Order will be removed from the echoWQ. IF patient calls bck to reschedule the order can be reinstated. Thank you.

## 2024-04-24 ENCOUNTER — Other Ambulatory Visit: Payer: Self-pay

## 2024-04-24 DIAGNOSIS — I6523 Occlusion and stenosis of bilateral carotid arteries: Secondary | ICD-10-CM

## 2024-04-26 ENCOUNTER — Ambulatory Visit (HOSPITAL_COMMUNITY)

## 2024-04-26 ENCOUNTER — Ambulatory Visit: Attending: Vascular Surgery | Admitting: Physician Assistant

## 2024-04-26 ENCOUNTER — Ambulatory Visit (HOSPITAL_COMMUNITY)
Admission: RE | Admit: 2024-04-26 | Discharge: 2024-04-26 | Disposition: A | Discharge status: Home / Self Care | Source: Ambulatory Visit | Attending: Vascular Surgery

## 2024-04-26 ENCOUNTER — Encounter: Payer: Self-pay | Admitting: Physician Assistant

## 2024-04-26 VITALS — BP 155/96 | HR 71 | Temp 98.7°F | Wt 198.8 lb

## 2024-04-26 DIAGNOSIS — I6523 Occlusion and stenosis of bilateral carotid arteries: Secondary | ICD-10-CM | POA: Insufficient documentation

## 2024-04-26 NOTE — Progress Notes (Signed)
 Office Note   History of Present Illness   Jesus Francis is a 68 y.o. (03-13-1956) male who presents for surveillance of carotid artery stenosis.  He has a history of left carotid endarterectomy with dacryon patch angioplasty on 02/12/2020 by Dr.Early. This was done in combination with CABG for symptomatic left carotid artery stenosis and CAD.  He returns today for follow up. He denies any recent CVA or TIA diagnosis. He also denies any recent strokelike symptoms such as slurred speech, facial droop, sudden visual changes, or sudden weakness/numbness.  He says that his cholesterol is well-controlled on Zetia  and rosuvastatin .  Current Outpatient Medications  Medication Sig Dispense Refill   aspirin  EC 81 MG tablet Take 81 mg by mouth at bedtime.      ezetimibe  (ZETIA ) 10 MG tablet      metoprolol  succinate (TOPROL -XL) 50 MG 24 hr tablet Take 1.5 tablets (75 mg total) by mouth daily. 45 tablet 3   nitroGLYCERIN  (NITROSTAT ) 0.4 MG SL tablet DISSOLVE 1 TAB UNDER TONGUE FOR CHEST PAIN - IF PAIN REMAINS AFTER 5 MIN, CALL 911 AND REPEAT DOSE. MAX 3 TABS IN 15 MINUTES 25 tablet 11   rosuvastatin  (CRESTOR ) 40 MG tablet Take 1 tablet (40 mg total) by mouth every evening. 90 tablet 3   No current facility-administered medications for this visit.    REVIEW OF SYSTEMS (negative unless checked):   Cardiac:  []  Chest pain or chest pressure? []  Shortness of breath upon activity? []  Shortness of breath when lying flat? []  Irregular heart rhythm?  Vascular:  []  Pain in calf, thigh, or hip brought on by walking? []  Pain in feet at night that wakes you up from your sleep? []  Blood clot in your veins? []  Leg swelling?  Pulmonary:  []  Oxygen at home? []  Productive cough? []  Wheezing?  Neurologic:  []  Sudden weakness in arms or legs? []  Sudden numbness in arms or legs? []  Sudden onset of difficult speaking or slurred speech? []  Temporary loss of vision in one eye? []  Problems with  dizziness?  Gastrointestinal:  []  Blood in stool? []  Vomited blood?  Genitourinary:  []  Burning when urinating? []  Blood in urine?  Psychiatric:  []  Major depression  Hematologic:  []  Bleeding problems? []  Problems with blood clotting?  Dermatologic:  []  Rashes or ulcers?  Constitutional:  []  Fever or chills?  Ear/Nose/Throat:  []  Change in hearing? []  Nose bleeds? []  Sore throat?  Musculoskeletal:  []  Back pain? []  Joint pain? []  Muscle pain?   Physical Examination   Vitals:   04/26/24 1414 04/26/24 1418  BP: (!) 165/81 (!) 155/96  Pulse: 71   Temp: 98.7 F (37.1 C)   TempSrc: Temporal   SpO2: 96%   Weight: 198 lb 12.8 oz (90.2 kg)    Body mass index is 29.36 kg/m.  General:  WDWN in NAD; vital signs documented above Gait: Not observed HENT: WNL, normocephalic Pulmonary: normal non-labored breathing , without rales, rhonchi,  wheezing Cardiac: regular Abdomen: soft, NT, no masses Skin: without rashes Vascular Exam/Pulses: palpable radial pulses bilaterally Extremities: without ischemic changes, without gangrene , without cellulitis; without open wounds;  Musculoskeletal: no muscle wasting or atrophy  Neurologic: A&O X 3;  No focal weakness or paresthesias are detected Psychiatric:  The pt has Normal affect.  Non-Invasive Vascular Imaging   Bilateral Carotid Duplex (04/26/2024):  R ICA stenosis:  40-59% R VA:  patent and antegrade L ICA stenosis:  1-39% L VA:  patent and antegrade  Medical Decision Making   Jesus Francis is a 68 y.o. male who presents for surveillance of carotid artery stenosis  Based on the patient's vascular studies, his carotid artery stenosis is unchanged. His right carotid stenosis is 40-59% and left is 1-39% He denies any strokelike symptoms such as slurred speech, facial droop, sudden visual changes, or sudden weakness/numbness. He is neurologically intact on exam.  He has palpable and equal radial pulses  bilaterally He can follow-up with our office in 1 year with repeat carotid duplex   Deneise Finlay PA-C [facility] Office: [phone]  Clinic MD: Susi Eric

## 2024-08-05 ENCOUNTER — Other Ambulatory Visit: Payer: Self-pay | Admitting: *Deleted

## 2024-08-05 DIAGNOSIS — I10 Essential (primary) hypertension: Secondary | ICD-10-CM

## 2024-08-05 MED ORDER — METOPROLOL SUCCINATE ER 50 MG PO TB24
75.0000 mg | ORAL_TABLET | Freq: Every day | ORAL | 3 refills | Status: AC
Start: 2024-08-05 — End: ?

## 2024-08-28 ENCOUNTER — Encounter: Payer: Self-pay | Admitting: Cardiovascular Disease

## 2024-08-28 ENCOUNTER — Ambulatory Visit: Attending: Cardiovascular Disease | Admitting: Cardiovascular Disease

## 2024-08-28 VITALS — BP 150/90 | HR 80 | Ht 69.0 in | Wt 195.0 lb

## 2024-08-28 DIAGNOSIS — I2510 Atherosclerotic heart disease of native coronary artery without angina pectoris: Secondary | ICD-10-CM

## 2024-08-28 DIAGNOSIS — I6523 Occlusion and stenosis of bilateral carotid arteries: Secondary | ICD-10-CM

## 2024-08-28 DIAGNOSIS — I10 Essential (primary) hypertension: Secondary | ICD-10-CM | POA: Diagnosis not present

## 2024-08-28 DIAGNOSIS — E782 Mixed hyperlipidemia: Secondary | ICD-10-CM | POA: Diagnosis not present

## 2024-08-28 NOTE — Progress Notes (Signed)
 Cardiology Office Note:    Date:  08/28/2024   ID:  Jesus Francis, DOB 1956-04-03, MRN [id number]  PCP:  Jesus Lenis, MD   [facility] Providers Cardiologist:  Debby Sor, MD (Inactive)     Referring MD: Jesus Lenis, MD   Chief Complaint  Patient presents with   Coronary Artery Disease    History of Present Illness:    Jesus Francis is a 68 y.o. male with a hx of CAD, presenting for followup evaluation. He underwent CABG ion 2022 after an abnormal cardiac catheterization demonstrating severe multivessel CAD performed for evaluation of angina. CABG involved a LIMA-LAD, SVG-PDA, SVG-OM, and SVG-ramus. He underwent concomitant left carotid endarterectomy. He has followed with Dr Sor and I will be assuming his care moving forward.   The patient is here with his wife today. He is doing very well. Today, he denies symptoms of palpitations, chest pain, shortness of breath, orthopnea, PND, lower extremity edema, dizziness, or syncope. He's not engaged in regular exercise. He does daily squats to maintain his balance but he doesn't do 'cardio' exercises. He does yard work without any exertional symptoms.    Current Medications: Current Meds  Medication Sig   aspirin  EC 81 MG tablet Take 81 mg by mouth at bedtime.    ezetimibe  (ZETIA ) 10 MG tablet    metoprolol  succinate (TOPROL -XL) 50 MG 24 hr tablet Take 1.5 tablets (75 mg total) by mouth daily. (Patient taking differently: Take 50 mg by mouth daily.)   nitroGLYCERIN  (NITROSTAT ) 0.4 MG SL tablet DISSOLVE 1 TAB UNDER TONGUE FOR CHEST PAIN - IF PAIN REMAINS AFTER 5 MIN, CALL 911 AND REPEAT DOSE. MAX 3 TABS IN 15 MINUTES   rosuvastatin  (CRESTOR ) 40 MG tablet Take 1 tablet (40 mg total) by mouth every evening.     Allergies:   Beta adrenergic blockers   ROS:   Please see the history of present illness.    All other systems reviewed and are negative.  EKGs/Labs/Other Studies Reviewed:    The following studies  were reviewed today: Cardiac Studies & Procedures   ______________________________________________________________________________________________ CARDIAC CATHETERIZATION  CARDIAC CATHETERIZATION 02/11/2020  Conclusion  Ramus lesion is 50% stenosed.  Dist LM to Ost LAD lesion is 30% stenosed.  1st Mrg lesion is 60% stenosed.  Prox LAD lesion is 90% stenosed.  Prox LAD to Mid LAD lesion is 60% stenosed.  Mid LAD lesion is 70% stenosed.  Prox RCA lesion is 99% stenosed.  Ost RCA to Prox RCA lesion is 90% stenosed.  Mid RCA lesion is 50% stenosed.  Dist RCA lesion is 20% stenosed.  Severe coronary calcification with multivessel CAD.  There is 30% distal narrowing of the left main coronary artery prior to its trifurcation into the LAD, ramus intermediate, and left circumflex vessel. The LAD has 90% very proximal (near ostial) stenosis followed by diffuse 60 and 70% proximal to mid calcified stenoses with diffuse 70% stenosis in the diagonal vessel; 50% very proximal ramus intermediate stenosis; 60% ostial OM1 stenosis and large dominant RCA with 90% proximal followed by 99% calcified proximal to mid stenosis with 50% mid stenosis and mild 20% areas of luminal irregularity.  LVEDP 16 mmHg.  RECOMMENDATION: Surgical consultation for CABG surgery.  The patient will be admitted to the hospital.  He will be hydrated post procedure, and started on heparin  therapy.  Aggressive lipid-lowering therapy will be necessary with potential need for PCSK9 inhibition.  Findings Coronary Findings Diagnostic  Dominance: Right  Left Main  Dist LM to Ost LAD lesion is 30% stenosed.  Left Anterior Descending Prox LAD lesion is 90% stenosed. Prox LAD to Mid LAD lesion is 60% stenosed. Mid LAD lesion is 70% stenosed.  Ramus Intermedius Ramus lesion is 50% stenosed.  Left Circumflex  First Obtuse Marginal Branch 1st Mrg lesion is 60% stenosed.  Right Coronary Artery Ost RCA to Prox RCA  lesion is 90% stenosed. Prox RCA lesion is 99% stenosed. Mid RCA lesion is 50% stenosed. Dist RCA lesion is 20% stenosed.  Intervention  No interventions have been documented.     ECHOCARDIOGRAM  ECHOCARDIOGRAM COMPLETE 06/09/2020  Narrative ECHOCARDIOGRAM REPORT    Patient Name:   Jesus Francis Date of Exam: 06/09/2020 Medical Rec #:  [id number]          Height:       69.8 in Accession #:    [id number]         Weight:       198.9 lb Date of Birth:  07-12-1956          BSA:          2.077 m Patient Age:    64 years           BP:           180/94 mmHg Patient Gender: M                  HR:           68 bpm. Exam Location:  [address]  Procedure: 2D Echo, Cardiac Doppler and Color Doppler  Indications:    I25.10 CAD  History:        Patient has prior history of Echocardiogram examinations, most recent 01/08/2020. Prior CABG, Arrythmias:Atrial Fibrillation; Risk Factors:Hypertension and HLD.  Sonographer:    Waldo Guadalajara RCS Referring Phys: [phone] THOMAS A KELLY  IMPRESSIONS   1. Left ventricular ejection fraction, by estimation, is 55 to 60%. The left ventricle has normal function. The left ventricle has no regional wall motion abnormalities. Left ventricular diastolic parameters are consistent with Grade I diastolic dysfunction (impaired relaxation). There is abnormal (paradoxical) septal motion, consistent with right ventricular volume overload. 2. Right ventricular systolic function is mildly reduced. The right ventricular size is mildly enlarged. There is normal pulmonary artery systolic pressure. The estimated right ventricular systolic pressure is 22.7 mmHg. 3. The mitral valve is grossly normal. Trivial mitral valve regurgitation. No evidence of mitral stenosis. 4. The aortic valve is tricuspid. Aortic valve regurgitation is mild. Mild to moderate aortic valve sclerosis/calcification is present, without any evidence of aortic stenosis. 5. The inferior vena cava  is normal in size with greater than 50% respiratory variability, suggesting right atrial pressure of 3 mmHg.  Comparison(s): No significant change from prior study.  FINDINGS Left Ventricle: Left ventricular ejection fraction, by estimation, is 55 to 60%. The left ventricle has normal function. The left ventricle has no regional wall motion abnormalities. The left ventricular internal cavity size was normal in size. There is no left ventricular hypertrophy. Abnormal (paradoxical) septal motion, consistent with right ventricular volume overload. Left ventricular diastolic parameters are consistent with Grade I diastolic dysfunction (impaired relaxation).  Right Ventricle: The right ventricular size is mildly enlarged. No increase in right ventricular wall thickness. Right ventricular systolic function is mildly reduced. There is normal pulmonary artery systolic pressure. The tricuspid regurgitant velocity is 2.22 m/s, and with an assumed right atrial pressure of 3 mmHg, the estimated right ventricular systolic pressure  is 22.7 mmHg.  Left Atrium: Left atrial size was normal in size.  Right Atrium: Right atrial size was normal in size.  Pericardium: There is no evidence of pericardial effusion.  Mitral Valve: The mitral valve is grossly normal. Trivial mitral valve regurgitation. No evidence of mitral valve stenosis.  Tricuspid Valve: The tricuspid valve is grossly normal. Tricuspid valve regurgitation is mild . No evidence of tricuspid stenosis.  Aortic Valve: The aortic valve is tricuspid. . There is moderate thickening and moderate calcification of the aortic valve. Aortic valve regurgitation is mild. Aortic regurgitation PHT measures 603 msec. Mild to moderate aortic valve sclerosis/calcification is present, without any evidence of aortic stenosis. There is moderate thickening of the aortic valve. There is moderate calcification of the aortic valve.  Pulmonic Valve: The pulmonic valve was  grossly normal. Pulmonic valve regurgitation is trivial. No evidence of pulmonic stenosis.  Aorta: The aortic root and ascending aorta are structurally normal, with no evidence of dilitation.  Venous: The inferior vena cava is normal in size with greater than 50% respiratory variability, suggesting right atrial pressure of 3 mmHg.  IAS/Shunts: The atrial septum is grossly normal.   LEFT VENTRICLE PLAX 2D LVIDd:         4.10 cm  Diastology LVIDs:         2.66 cm  LV e' lateral:   9.77 cm/s LV PW:         0.80 cm  LV E/e' lateral: 9.3 LV IVS:        0.73 cm  LV e' medial:    8.49 cm/s LVOT diam:     2.00 cm  LV E/e' medial:  10.7 LV SV:         69 LV SV Index:   33 LVOT Area:     3.14 cm   RIGHT VENTRICLE RV Basal diam:  4.32 cm RV S prime:     6.96 cm/s TAPSE (M-mode): 0.9 cm RVSP:           22.7 mmHg  LEFT ATRIUM             Index       RIGHT ATRIUM LA diam:        4.10 cm 1.97 cm/m  RA Pressure: 3.00 mmHg LA Vol (A2C):   48.2 ml 23.21 ml/m LA Vol (A4C):   39.6 ml 19.06 ml/m LA Biplane Vol: 44.0 ml 21.18 ml/m AORTIC VALVE LVOT Vmax:   97.30 cm/s LVOT Vmean:  63.600 cm/s LVOT VTI:    0.221 m AI PHT:      603 msec  AORTA Ao Root diam: 3.80 cm  MITRAL VALVE               TRICUSPID VALVE MV Area (PHT):             TR Peak grad:   19.7 mmHg MV Decel Time:             TR Vmax:        222.00 cm/s MV E velocity: 90.80 cm/s  Estimated RAP:  3.00 mmHg MV A velocity: 66.00 cm/s  RVSP:           22.7 mmHg MV E/A ratio:  1.38 SHUNTS Systemic VTI:  0.22 m Systemic Diam: 2.00 cm  Darryle Decent MD Electronically signed by Darryle Decent MD Signature Date/Time: 06/09/2020/1:17:08 PM    Final   TEE  ECHO INTRAOPERATIVE TEE 02/12/2020  Narrative *INTRAOPERATIVE TRANSESOPHAGEAL REPORT *    Patient  Name:   SABRI TEAL Date of Exam: 02/12/2020 Medical Rec #:  [id number]          Height:       69.0 in Accession #:    [id number]         Weight:       206.1  lb Date of Birth:  1956-08-21          BSA:          2.09 m Patient Age:    63 years           BP:           151/102 mmHg Patient Gender: M                  HR:           71 bpm. Exam Location:  Anesthesiology  Transesophogeal exam was perform intraoperatively during surgical procedure. Patient was closely monitored under general anesthesia during the entirety of examination.  Indications:     CAD Sonographer:     Lyle Marc RDCS Performing Phys: [id number] LINNIE KIDD LIGHTFOOT Diagnosing Phys: Debby Like MD  Complications: No known complications during this procedure. POST-OP IMPRESSIONS - Left Ventricle: The left ventricle is unchanged from pre-bypass. - Right Ventricle: The right ventricle appears unchanged from pre-bypass. - Aorta: The aorta appears unchanged from pre-bypass. - Left Atrium: The left atrium appears unchanged from pre-bypass. - Left Atrial Appendage: The left atrial appendage appears unchanged from pre-bypass. - Aortic Valve: The aortic valve appears unchanged from pre-bypass. - Mitral Valve: The mitral valve appears unchanged from pre-bypass. - Tricuspid Valve: The tricuspid valve appears unchanged from pre-bypass. - Interatrial Septum: The interatrial septum appears unchanged from pre-bypass. - Interventricular Septum: The interventricular septum appears unchanged from pre-bypass. - Pericardium: The pericardium appears unchanged from pre-bypass.  PRE-OP FINDINGS Left Ventricle: The left ventricle has normal systolic function, with an ejection fraction of 55-60%. The cavity size was normal. There is mild upper septal hypertrophy.  Right Ventricle: The right ventricle has normal systolic function. The cavity was normal. There is no increase in right ventricular wall thickness.  Left Atrium: Left atrial size was normal in size. The left atrial appendage is well visualized and there is no evidence of thrombus present.  Right Atrium: Right atrial size was  normal in size.  Interatrial Septum: No atrial level shunt detected by color flow Doppler.  Pericardium: There is no evidence of pericardial effusion.  Mitral Valve: The mitral valve is normal in structure. Mitral valve regurgitation is mild by color flow Doppler.  Tricuspid Valve: The tricuspid valve was normal in structure. Tricuspid valve regurgitation was not visualized by color flow Doppler.  Aortic Valve: The aortic valve is tricuspid Aortic valve regurgitation is trivial by color flow Doppler. There is no evidence of aortic valve stenosis.  Pulmonic Valve: The pulmonic valve was normal in structure. Pulmonic valve regurgitation is trivial by color flow Doppler.    Debby Like MD Electronically signed by Debby Like MD Signature Date/Time: 02/12/2020/2:42:01 PM    Final    CT SCANS  CT CORONARY FRACTIONAL FLOW RESERVE DATA PREP 01/30/2020  Narrative EXAM: CT FFR ANALYSIS  CLINICAL DATA:  Hx of chestpain  FINDINGS: FFRct analysis was performed on the original cardiac CT angiogram dataset. Diagrammatic representation of the FFRct analysis is provided in a separate PDF document in PACS. This dictation was created using the PDF document and an interactive 3D model of the results. 3D model  is not available in the EMR/PACS. Normal FFR range is >0.80.  1. Left Main:  No significant stenosis.  2. LAD: Severe stenosis in the proximal to mid LAD.  FFR <0.5 3. LCX: Significant stenosis in LCx, FFR 0.77. 4. RCA: Severe stenosis in the proximal RCA FFR of 0.74, total occ in the mid RCA.  IMPRESSION: 1. CT FFR showed significant stenosis involving the LAD, RCA and LCx.   Electronically Signed By: Redell Cave M.D. On: 01/31/2020 13:26   CT CORONARY MORPH W/CTA COR W/SCORE 01/30/2020  Addendum 01/31/2020  8:42 AM ADDENDUM REPORT: 01/31/2020 08:40  EXAM: OVER-READ INTERPRETATION  CT CHEST  The following report is an over-read performed by radiologist  Dr. Marcey Diones [facility] on 01/31/2020. This over-read does not include interpretation of cardiac or coronary anatomy or pathology. The coronary CTA interpretation by the cardiologist is attached.  COMPARISON:  None.  FINDINGS: No incidental vascular findings. Visualized mediastinum and hilar regions demonstrate no lymphadenopathy or masses. Visualized lungs demonstrate chronic disease with reticulonodular interstitial prominence bilaterally and scattered areas of scarring. No airspace consolidation, edema, mass, pneumothorax or pleural fluid identified in the visualized lungs. Visualized upper abdomen is unremarkable. Visualized bony structures are unremarkable.  IMPRESSION: Evidence of chronic interstitial lung disease.   Electronically Signed By: Marcey Moan M.D. On: 01/31/2020 08:40  Narrative CLINICAL DATA:  Hx of chestpain  EXAM: Cardiac/Coronary  CTA  TECHNIQUE: The patient was scanned on a Siemens Somatoform go.Top scanner.  FINDINGS: A retrospective scan was triggered in the descending thoracic aorta. Axial non-contrast 3 mm slices were carried out through the heart. The data set was analyzed on a dedicated work station and scored using the Agatson method. Gantry rotation speed was 330 msecs and collimation was .6 mm. 50mg  of metoprolol  and 0.8 mg of sl NTG was given. The 3D data set was reconstructed in 5% intervals of the 50-95 % of the R-R cycle. Diastolic phases were analyzed on a dedicated work station using MPR, MIP and VRT modes. The patient received 100 cc of contrast.  Aorta: Normal size. Ascending and descending aorta calcifications noted. No dissection.  Aortic Valve:  Trileaflet.  mild calcifications.  Coronary Arteries:  Normal coronary origin.  Right dominance.  RCA is a large dominant artery that gives rise to PDA and PLA. There is severe calcified plaque in the proximal segment causing severe disease (>70%)  Left  main is a large artery that gives rise to LAD and LCX arteries.  LAD is a large vessel that is diffusely diseased with severe calcified and non calcified plaque from the proximal to mid segment causing severe stenosis (>70%).  LCX is a non-dominant artery that gives rise to one OM1 branch. There is no plaque.  Other findings:  Normal pulmonary vein drainage into the left atrium.  Normal left atrial appendage without a thrombus.  Normal size of the pulmonary artery.  IMPRESSION: 1. Coronary calcium  score of 2195. This was 98th percentile for age and sex matched control.  2. Normal coronary origin with right dominance.  3. Severe calcified plaque in the proximal RCA and proximal to mid LAD causing severe stenosis.  4. CAD-RADS 4 Severe stenosis. (70-99% or > 50% left main). Cardiac catheterization or CT FFR is recommended. Consider symptom-guided anti-ischemic pharmacotherapy as well as risk factor modification per guideline directed care. Additional analysis with CT FFR will be submitted and reported separately.  Electronically Signed: By: Redell Cave M.D. On: 01/30/2020 16:51  ______________________________________________________________________________________________      EKG:        Recent Labs: 03/28/2024: ALT 27; BUN 13; Creatinine, Ser 1.06; Hemoglobin 16.0; Platelets 236; Potassium 5.1; Sodium 136; TSH 1.980  Recent Lipid Panel    Component Value Date/Time   CHOL 120 03/28/2024 1120   TRIG 91 03/28/2024 1120   HDL 39 (L) 03/28/2024 1120   CHOLHDL 3.1 03/28/2024 1120   LDLCALC 63 03/28/2024 1120           Physical Exam:    VS:  BP (!) 150/90   Pulse 80   Ht 5' 9 (1.753 m)   Wt 195 lb (88.5 kg)   SpO2 97%   BMI 28.80 kg/m     Wt Readings from Last 3 Encounters:  08/28/24 195 lb (88.5 kg)  04/26/24 198 lb 12.8 oz (90.2 kg)  03/28/24 203 lb 3.2 oz (92.2 kg)     GEN:  Well nourished, well developed in no acute distress HEENT:  Normal NECK: No JVD; No carotid bruits LYMPHATICS: No lymphadenopathy CARDIAC: RRR, soft 1/6 ejection murmur at the right upper sternal border RESPIRATORY:  Clear to auscultation without rales, wheezing or rhonchi  ABDOMEN: Soft, non-tender, non-distended MUSCULOSKELETAL:  No edema; No deformity  SKIN: Warm and dry NEUROLOGIC:  Alert and oriented x 3 PSYCHIATRIC:  Normal affect   Assessment & Plan Essential hypertension History of whitecoat hypertension.  Home blood pressures range 120-130/70-80s.  Patient taking metoprolol  succinate 50 mg daily.  Continue the same. Bilateral carotid artery stenosis History of carotid endarterectomy.  Recent carotid Doppler demonstrated 40 to 59% right internal carotid artery stenosis and widely patent left carotid endarterectomy site.  Continue medical management. Coronary artery disease involving native coronary artery of native heart without angina pectoris Continue aspirin , Zetia , metoprolol , rosuvastatin . Mixed hyperlipidemia Lipids at goal.  LDL cholesterol 63.  Patient has a history of elevated LP(a).  He is not interested in referral to lipid clinic for consideration of a PCSK9.  We discussed this specifically today.  He might be interested in more specific LP(a) directed therapies as they become approved.     Medication Adjustments/Labs and Tests Ordered: Current medicines are reviewed at length with the patient today.  Concerns regarding medicines are outlined above.  No orders of the defined types were placed in this encounter.  No orders of the defined types were placed in this encounter.   Patient Instructions  Medication Instructions:  Your physician recommends that you continue on your current medications as directed. Please refer to the Current Medication list given to you today.  *If you need a refill on your cardiac medications before your next appointment, please call your pharmacy*  Lab Work: None ordered.  You may go to any  [facility] Location for your lab work:  [facility] - [id number] ([facility]) - [address] [phone] [address]  [place] - [address]   [place]  - [address]   [place] - [address] - [address] [employer]  - [address] - [address] ([facility]   If you have labs (blood work) drawn today and your tests are completely normal, you will receive your results only by: MyChart Message (if you have MyChart)  If you have any lab test that is abnormal or we need to change your treatment, we will call you  or send a MyChart message to review the results.  Testing/Procedures: None ordered.  Follow-Up: At [facility], you and your health needs are our priority.  As part of our continuing mission to provide you with exceptional heart care, we have created designated Provider Care Teams.  These Care Teams include your primary Cardiologist (physician) and Advanced Practice Providers (APPs -  Physician Assistants and Nurse Practitioners) who all work together to provide you with the care you need, when you need it.   Your next appointment:   1 year(s)  The format for your next appointment:   In Person  Provider:   Ozell Fell, MD         Signed, Ozell Fell, MD  08/28/2024 12:49 PM    [facility]

## 2024-08-28 NOTE — Assessment & Plan Note (Signed)
 Lipids at goal.  LDL cholesterol 63.  Patient has a history of elevated LP(a).  He is not interested in referral to lipid clinic for consideration of a PCSK9.  We discussed this specifically today.  He might be interested in more specific LP(a) directed therapies as they become approved.

## 2024-08-28 NOTE — Assessment & Plan Note (Signed)
 Continue aspirin , Zetia , metoprolol , rosuvastatin .

## 2024-08-28 NOTE — Assessment & Plan Note (Signed)
 History of whitecoat hypertension.  Home blood pressures range 120-130/70-80s.  Patient taking metoprolol  succinate 50 mg daily.  Continue the same.

## 2024-08-28 NOTE — Patient Instructions (Signed)
 Medication Instructions:  Your physician recommends that you continue on your current medications as directed. Please refer to the Current Medication list given to you today.  *If you need a refill on your cardiac medications before your next appointment, please call your pharmacy*  Lab Work: None ordered.  You may go to any [facility] Location for your lab work:  [facility] - [address] ([facility]) - [address] [phone] [address]  [place] - [address]   [place]  - [address]   [place] - [address] - [address] [employer]  - [address] - [address] ([facility]   If you have labs (blood work) drawn today and your tests are completely normal, you will receive your results only by: [employer] (if you have MyChart)  If you have any lab test that is abnormal or we need to change your treatment, we will call you or send a MyChart message to review the results.  Testing/Procedures: None ordered.  Follow-Up: At [facility], you and your health needs are our priority.  As part of our continuing mission to provide you with exceptional heart care, we have created designated Provider Care Teams.  These Care Teams include your primary Cardiologist (physician) and Advanced Practice Providers (APPs -  Physician Assistants and Nurse Practitioners) who all work together to provide you with the care you need, when you need it.  Your next appointment:   1 year(s)  The format for your next appointment:   In Person  Provider:   Ozell Fell, MD
